# Patient Record
Sex: Male | Born: 1935
Health system: Southern US, Community
[De-identification: ages and names within clinical notes are randomized; demographics above are authoritative.]

## PROBLEM LIST (undated history)

## (undated) DIAGNOSIS — I255 Ischemic cardiomyopathy: Secondary | ICD-10-CM

## (undated) DIAGNOSIS — Z923 Personal history of irradiation: Secondary | ICD-10-CM

## (undated) DIAGNOSIS — Z87442 Personal history of urinary calculi: Secondary | ICD-10-CM

## (undated) DIAGNOSIS — I2510 Atherosclerotic heart disease of native coronary artery without angina pectoris: Secondary | ICD-10-CM

## (undated) DIAGNOSIS — I499 Cardiac arrhythmia, unspecified: Secondary | ICD-10-CM

## (undated) DIAGNOSIS — N138 Other obstructive and reflux uropathy: Secondary | ICD-10-CM

## (undated) DIAGNOSIS — G629 Polyneuropathy, unspecified: Secondary | ICD-10-CM

## (undated) DIAGNOSIS — N400 Enlarged prostate without lower urinary tract symptoms: Secondary | ICD-10-CM

## (undated) DIAGNOSIS — N183 Chronic kidney disease, stage 3 unspecified: Secondary | ICD-10-CM

## (undated) DIAGNOSIS — E119 Type 2 diabetes mellitus without complications: Secondary | ICD-10-CM

## (undated) DIAGNOSIS — Z972 Presence of dental prosthetic device (complete) (partial): Secondary | ICD-10-CM

## (undated) DIAGNOSIS — N200 Calculus of kidney: Secondary | ICD-10-CM

## (undated) DIAGNOSIS — H269 Unspecified cataract: Secondary | ICD-10-CM

## (undated) DIAGNOSIS — Z794 Long term (current) use of insulin: Secondary | ICD-10-CM

## (undated) DIAGNOSIS — I480 Paroxysmal atrial fibrillation: Secondary | ICD-10-CM

## (undated) DIAGNOSIS — N3281 Overactive bladder: Secondary | ICD-10-CM

## (undated) DIAGNOSIS — R351 Nocturia: Secondary | ICD-10-CM

## (undated) DIAGNOSIS — H409 Unspecified glaucoma: Secondary | ICD-10-CM

## (undated) DIAGNOSIS — E785 Hyperlipidemia, unspecified: Secondary | ICD-10-CM

## (undated) DIAGNOSIS — Z9221 Personal history of antineoplastic chemotherapy: Secondary | ICD-10-CM

## (undated) DIAGNOSIS — N401 Enlarged prostate with lower urinary tract symptoms: Secondary | ICD-10-CM

## (undated) DIAGNOSIS — K08109 Complete loss of teeth, unspecified cause, unspecified class: Secondary | ICD-10-CM

## (undated) HISTORY — DX: Unspecified cataract: H26.9

## (undated) HISTORY — DX: Calculus of kidney: N20.0

## (undated) HISTORY — PX: HERNIA REPAIR: SHX51

## (undated) HISTORY — PX: OTHER SURGICAL HISTORY: SHX169

## (undated) HISTORY — PX: POLYPECTOMY: SHX149

## (undated) HISTORY — PX: TONSILLECTOMY: SHX5217

## (undated) HISTORY — PX: COLONOSCOPY: SHX174

## (undated) HISTORY — DX: Polyneuropathy, unspecified: G62.9

## (undated) HISTORY — PX: CYSTOSCOPY: SUR368

## (undated) HISTORY — PX: LITHOTRIPSY: SUR834

## (undated) HISTORY — PX: UPPER GASTROINTESTINAL ENDOSCOPY: SHX188

## (undated) HISTORY — DX: Hyperlipidemia, unspecified: E78.5

## (undated) HISTORY — PX: EXTRACORPOREAL SHOCK WAVE LITHOTRIPSY: SHX1557

## (undated) HISTORY — PX: CATARACT EXTRACTION W/ INTRAOCULAR LENS IMPLANT: SHX1309

## (undated) HISTORY — DX: Atherosclerotic heart disease of native coronary artery without angina pectoris: I25.10

## (undated) HISTORY — PX: CIRCUMCISION: SUR203

---

## 1964-07-07 HISTORY — PX: INGUINAL HERNIA REPAIR: SUR1180

## 1973-07-07 HISTORY — PX: TYMPANOPLASTY: SHX33

## 1978-07-07 HISTORY — PX: CIRCUMCISION: SUR203

## 2005-01-22 ENCOUNTER — Encounter: Admission: RE | Admit: 2005-01-22 | Discharge: 2005-01-22 | Payer: Self-pay | Admitting: Endocrinology

## 2009-07-07 HISTORY — PX: UPPER GASTROINTESTINAL ENDOSCOPY: SHX188

## 2009-07-07 HISTORY — PX: PATENT FORAMEN OVALE CLOSURE: SHX2181

## 2009-07-09 ENCOUNTER — Encounter (INDEPENDENT_AMBULATORY_CARE_PROVIDER_SITE_OTHER): Payer: Self-pay | Admitting: *Deleted

## 2009-07-12 ENCOUNTER — Ambulatory Visit: Payer: Self-pay | Admitting: Internal Medicine

## 2009-07-12 DIAGNOSIS — R634 Abnormal weight loss: Secondary | ICD-10-CM | POA: Insufficient documentation

## 2009-07-12 DIAGNOSIS — E119 Type 2 diabetes mellitus without complications: Secondary | ICD-10-CM

## 2009-07-12 DIAGNOSIS — R110 Nausea: Secondary | ICD-10-CM | POA: Insufficient documentation

## 2009-07-12 DIAGNOSIS — R1084 Generalized abdominal pain: Secondary | ICD-10-CM

## 2009-07-13 ENCOUNTER — Ambulatory Visit: Payer: Self-pay | Admitting: Internal Medicine

## 2009-07-16 ENCOUNTER — Ambulatory Visit: Payer: Self-pay | Admitting: Internal Medicine

## 2009-07-16 LAB — CONVERTED CEMR LAB
BUN: 12 mg/dL (ref 6–23)
Creatinine, Ser: 1 mg/dL (ref 0.4–1.5)

## 2009-07-17 ENCOUNTER — Encounter: Payer: Self-pay | Admitting: Internal Medicine

## 2009-07-20 ENCOUNTER — Telehealth: Payer: Self-pay | Admitting: Internal Medicine

## 2009-07-20 ENCOUNTER — Ambulatory Visit: Payer: Self-pay | Admitting: Internal Medicine

## 2009-07-23 ENCOUNTER — Encounter: Payer: Self-pay | Admitting: Internal Medicine

## 2009-09-03 ENCOUNTER — Ambulatory Visit: Payer: Self-pay | Admitting: Internal Medicine

## 2009-09-03 DIAGNOSIS — Z860100 Personal history of colon polyps, unspecified: Secondary | ICD-10-CM | POA: Insufficient documentation

## 2009-09-03 DIAGNOSIS — K219 Gastro-esophageal reflux disease without esophagitis: Secondary | ICD-10-CM

## 2009-09-03 DIAGNOSIS — K5730 Diverticulosis of large intestine without perforation or abscess without bleeding: Secondary | ICD-10-CM | POA: Insufficient documentation

## 2009-10-18 ENCOUNTER — Encounter: Payer: Self-pay | Admitting: Internal Medicine

## 2009-11-13 ENCOUNTER — Encounter: Payer: Self-pay | Admitting: Internal Medicine

## 2009-11-29 ENCOUNTER — Ambulatory Visit: Payer: Self-pay | Admitting: Internal Medicine

## 2009-11-29 DIAGNOSIS — R0602 Shortness of breath: Secondary | ICD-10-CM

## 2009-11-29 DIAGNOSIS — E785 Hyperlipidemia, unspecified: Secondary | ICD-10-CM | POA: Insufficient documentation

## 2009-11-29 DIAGNOSIS — R930 Abnormal findings on diagnostic imaging of skull and head, not elsewhere classified: Secondary | ICD-10-CM | POA: Insufficient documentation

## 2010-02-01 ENCOUNTER — Ambulatory Visit: Payer: Self-pay

## 2010-02-01 ENCOUNTER — Ambulatory Visit (HOSPITAL_COMMUNITY): Admission: RE | Admit: 2010-02-01 | Discharge: 2010-02-01 | Payer: Self-pay | Admitting: Internal Medicine

## 2010-02-01 ENCOUNTER — Ambulatory Visit: Payer: Self-pay | Admitting: Cardiovascular Disease

## 2010-02-01 ENCOUNTER — Encounter: Payer: Self-pay | Admitting: Internal Medicine

## 2010-02-05 ENCOUNTER — Ambulatory Visit: Payer: Self-pay | Admitting: Internal Medicine

## 2010-02-05 ENCOUNTER — Ambulatory Visit: Payer: Self-pay | Admitting: Cardiology

## 2010-02-05 ENCOUNTER — Inpatient Hospital Stay (HOSPITAL_COMMUNITY): Admission: AD | Admit: 2010-02-05 | Discharge: 2010-02-07 | Payer: Self-pay | Admitting: Internal Medicine

## 2010-02-05 ENCOUNTER — Ambulatory Visit: Payer: Self-pay | Admitting: Surgery

## 2010-02-05 DIAGNOSIS — I200 Unstable angina: Secondary | ICD-10-CM

## 2010-02-05 HISTORY — PX: CARDIAC CATHETERIZATION: SHX172

## 2010-02-06 ENCOUNTER — Ambulatory Visit: Payer: Self-pay | Admitting: Vascular Surgery

## 2010-02-06 ENCOUNTER — Encounter: Payer: Self-pay | Admitting: Surgery

## 2010-02-11 ENCOUNTER — Inpatient Hospital Stay (HOSPITAL_COMMUNITY): Admission: RE | Admit: 2010-02-11 | Discharge: 2010-02-16 | Payer: Self-pay | Admitting: Cardiothoracic Surgery

## 2010-02-11 ENCOUNTER — Ambulatory Visit: Payer: Self-pay | Admitting: Internal Medicine

## 2010-02-11 DIAGNOSIS — Z8774 Personal history of (corrected) congenital malformations of heart and circulatory system: Secondary | ICD-10-CM

## 2010-02-11 DIAGNOSIS — Z951 Presence of aortocoronary bypass graft: Secondary | ICD-10-CM

## 2010-02-11 HISTORY — PX: CORONARY ARTERY BYPASS GRAFT: SHX141

## 2010-02-11 HISTORY — DX: Personal history of (corrected) congenital malformations of heart and circulatory system: Z87.74

## 2010-02-11 HISTORY — DX: Presence of aortocoronary bypass graft: Z95.1

## 2010-02-21 ENCOUNTER — Encounter: Payer: Self-pay | Admitting: Internal Medicine

## 2010-02-22 ENCOUNTER — Encounter: Payer: Self-pay | Admitting: Physician Assistant

## 2010-02-26 ENCOUNTER — Ambulatory Visit: Payer: Self-pay | Admitting: Cardiovascular Disease

## 2010-02-26 ENCOUNTER — Encounter: Payer: Self-pay | Admitting: Physician Assistant

## 2010-02-26 DIAGNOSIS — I2589 Other forms of chronic ischemic heart disease: Secondary | ICD-10-CM

## 2010-02-26 DIAGNOSIS — I4891 Unspecified atrial fibrillation: Secondary | ICD-10-CM

## 2010-02-26 DIAGNOSIS — H539 Unspecified visual disturbance: Secondary | ICD-10-CM

## 2010-02-26 DIAGNOSIS — I2541 Coronary artery aneurysm: Secondary | ICD-10-CM | POA: Insufficient documentation

## 2010-02-26 DIAGNOSIS — I25810 Atherosclerosis of coronary artery bypass graft(s) without angina pectoris: Secondary | ICD-10-CM | POA: Insufficient documentation

## 2010-03-14 ENCOUNTER — Encounter (HOSPITAL_COMMUNITY): Admission: RE | Admit: 2010-03-14 | Discharge: 2010-04-05 | Payer: Self-pay | Admitting: Internal Medicine

## 2010-03-18 ENCOUNTER — Encounter: Admission: RE | Admit: 2010-03-18 | Discharge: 2010-03-18 | Payer: Self-pay | Admitting: Cardiothoracic Surgery

## 2010-03-18 ENCOUNTER — Ambulatory Visit: Payer: Self-pay | Admitting: Cardiothoracic Surgery

## 2010-03-18 ENCOUNTER — Encounter: Payer: Self-pay | Admitting: Internal Medicine

## 2010-03-20 ENCOUNTER — Telehealth: Payer: Self-pay | Admitting: Internal Medicine

## 2010-03-27 ENCOUNTER — Encounter: Payer: Self-pay | Admitting: Internal Medicine

## 2010-04-05 ENCOUNTER — Telehealth: Payer: Self-pay | Admitting: Internal Medicine

## 2010-04-05 ENCOUNTER — Encounter: Payer: Self-pay | Admitting: Internal Medicine

## 2010-04-06 ENCOUNTER — Encounter (HOSPITAL_COMMUNITY)
Admission: RE | Admit: 2010-04-06 | Discharge: 2010-06-24 | Payer: Self-pay | Source: Home / Self Care | Attending: Internal Medicine | Admitting: Internal Medicine

## 2010-04-08 ENCOUNTER — Telehealth (INDEPENDENT_AMBULATORY_CARE_PROVIDER_SITE_OTHER): Payer: Self-pay | Admitting: *Deleted

## 2010-04-09 ENCOUNTER — Encounter: Payer: Self-pay | Admitting: Internal Medicine

## 2010-04-11 ENCOUNTER — Telehealth: Payer: Self-pay | Admitting: Internal Medicine

## 2010-04-18 ENCOUNTER — Encounter: Payer: Self-pay | Admitting: Internal Medicine

## 2010-04-23 ENCOUNTER — Encounter: Payer: Self-pay | Admitting: Internal Medicine

## 2010-05-01 ENCOUNTER — Encounter: Payer: Self-pay | Admitting: Internal Medicine

## 2010-05-01 ENCOUNTER — Ambulatory Visit: Payer: Self-pay | Admitting: Internal Medicine

## 2010-05-10 ENCOUNTER — Encounter: Payer: Self-pay | Admitting: Internal Medicine

## 2010-05-13 ENCOUNTER — Ambulatory Visit: Payer: Self-pay

## 2010-05-13 ENCOUNTER — Ambulatory Visit (HOSPITAL_COMMUNITY): Admission: RE | Admit: 2010-05-13 | Discharge: 2010-05-13 | Payer: Self-pay | Admitting: Internal Medicine

## 2010-05-13 ENCOUNTER — Encounter: Payer: Self-pay | Admitting: Internal Medicine

## 2010-07-02 ENCOUNTER — Encounter: Payer: Self-pay | Admitting: Internal Medicine

## 2010-07-12 ENCOUNTER — Encounter: Payer: Self-pay | Admitting: Internal Medicine

## 2010-08-06 NOTE — Procedures (Signed)
Summary: Colonoscopy  Patient: [name] Note: All result statuses are Final unless otherwise noted.  Tests: (1) Colonoscopy (COL)   COL Colonoscopy           DONE     [facility]     [address]     [place], [place]  [address]           COLONOSCOPY PROCEDURE REPORT           PATIENT:  Brendan Meyer, Brendan Meyer  MR#:  [id number]     BIRTHDATE:  1935-04-08, 74 yrs. old  GENDER:  male           ENDOSCOPIST:  Wilhemina Bonito. Eda Keys, MD     Referred by:  Adrian Prince, M.D.           PROCEDURE DATE:  07/13/2009     PROCEDURE:  Colonoscopy with snare polypectomy     ASA CLASS:  Class II     INDICATIONS:  Routine Risk Screening, abdominal pain           MEDICATIONS:   Fentanyl 75 mcg IV, Versed 8 mg IV           DESCRIPTION OF PROCEDURE:   After the risks benefits and     alternatives of the procedure were thoroughly explained, informed     consent was obtained.  Digital rectal exam was performed and     revealed no abnormalities.   The LB [id number] endoscope     was introduced through the anus and advanced to the cecum, which     was identified by both the appendix and ileocecal valve, without     limitations. Time to cecum = 4:06 min. The quality of the prep was     excellent, using MoviPrep.  The instrument was then slowly     withdrawn (time = 13:27 min) as the colon was fully examined.     <<PROCEDUREIMAGES>>           FINDINGS:  Two 3mm polyps were found in the descending colon and     rectum. Polyps were snared without cautery. Retrieval was     successful.   Moderate diverticulosis was found throughout the     colon.   Retroflexed views in the rectum revealed internal     hemorrhoids.    The scope was then withdrawn from the patient and     the procedure completed.           COMPLICATIONS:  None           ENDOSCOPIC IMPRESSION:     1) Two polyps- removed     2) Moderate diverticulosis throughout the colon     3) Internal hemorrhoids        RECOMMENDATIONS:     1) Follow up colonoscopy in 5 years     2) EGD today           ______________________________     Wilhemina Bonito. Eda Keys, MD           CC:  Adrian Prince, MD; The Patient           n.     eSIGNED:   Wilhemina Bonito. Eda Keys at 07/13/2009 08:58 AM           Vanessa Ralphs, [id number]  Note: An exclamation mark (!) indicates a result that was not dispersed into the flowsheet. Document Creation Date: 07/13/2009 8:57 AM _______________________________________________________________________  (1) Order result status: Final  Collection or observation date-time: 07/13/2009 08:39 Requested date-time:  Receipt date-time:  Reported date-time:  Referring Physician:   Ordering Physician: Fransico Setters [phone]) Specimen Source:  Source: Launa Grill Order Number: [id number] Lab site:   Appended Document: Colonoscopy Patient 's wife notified of apppt for CT scan at [facility].  He will come on Monday for lab.  Written instructions left at the front desk.   Darcey Nora RN, [facility]  July 13, 2009 1:47 PM     Clinical Lists Changes  Orders: Added new Referral order of CT Abdomen/Pelvis with Contrast (CT Abd/Pelvis w/con) - Signed      Appended Document: Colonoscopy recall     Procedures Next Due Date:    Colonoscopy: 07/2014

## 2010-08-06 NOTE — Letter (Signed)
Summary: [facility] Instructions  [facility]  [address] [place], [place] [address]   Phone: [phone]  Fax: [phone]       Brendan Meyer    1935-05-28    MRN: [id number]        Procedure Day /Date:FRIDAY, 07/13/09     Arrival Time:7:30 AM     Procedure Time:8:00 AM     Location of Procedure:                    [facility] (4th Floor)                        PREPARATION FOR COLONOSCOPY WITH MOVIPREP/ENDO   Starting 5 days prior to your procedure NOW do not eat nuts, seeds, popcorn, corn, beans, peas,  salads, or any raw vegetables.  Do not take any fiber supplements (e.g. Metamucil, Citrucel, and Benefiber).  THE DAY BEFORE YOUR PROCEDURE         DATE: 07/12/09  DAY: THURSDAY  1.  Drink clear liquids the entire day-NO SOLID FOOD  2.  Do not drink anything colored red or purple.  Avoid juices with pulp.  No orange juice.  3.  Drink at least 64 oz. (8 glasses) of fluid/clear liquids during the day to prevent dehydration and help the prep work efficiently.  CLEAR LIQUIDS INCLUDE: Water Jello Ice Popsicles Tea (sugar ok, no milk/cream) Powdered fruit flavored drinks Coffee (sugar ok, no milk/cream) Gatorade Juice: apple, white grape, white cranberry  Lemonade Clear bullion, consomm, broth Carbonated beverages (any kind) Strained chicken noodle soup Hard Candy                             4.  In the morning, mix first dose of MoviPrep solution:    Empty 1 Pouch A and 1 Pouch B into the disposable container    Add lukewarm drinking water to the top line of the container. Mix to dissolve    Refrigerate (mixed solution should be used within 24 hrs)  5.  Begin drinking the prep at 5:00 p.m. The MoviPrep container is divided by 4 marks.   Every 15 minutes drink the solution down to the next mark (approximately 8 oz) until the full liter is complete.   6.  Follow completed prep with 16 oz of clear liquid of your choice (Nothing red  or purple).  Continue to drink clear liquids until bedtime.  7.  Before going to bed, mix second dose of MoviPrep solution:    Empty 1 Pouch A and 1 Pouch B into the disposable container    Add lukewarm drinking water to the top line of the container. Mix to dissolve    Refrigerate  THE DAY OF YOUR PROCEDURE      DATE: 07/13/09 DAY: FRIDAY  Beginning at 4:00 AM (5 hours before procedure):         1. Every 15 minutes, drink the solution down to the next mark (approx 8 oz) until the full liter is complete.  2. Follow completed prep with 16 oz. of clear liquid of your choice.    3. You may drink clear liquids until 6:00 AM(2 HOURS BEFORE PROCEDURE).   MEDICATION INSTRUCTIONS  Unless otherwise instructed, you should take regular prescription medications with a small sip of water   as early as possible the morning of your procedure.  Diabetic patients -  see separate instructions.         OTHER INSTRUCTIONS  You will need a responsible adult at least 74 years of age to accompany you and drive you home.   This person must remain in the waiting room during your procedure.  Wear loose fitting clothing that is easily removed.  Leave jewelry and other valuables at home.  However, you may wish to bring a book to read or  an iPod/MP3 player to listen to music as you wait for your procedure to start.  Remove all body piercing jewelry and leave at home.  Total time from sign-in until discharge is approximately 2-3 hours.  You should go home directly after your procedure and rest.  You can resume normal activities the  day after your procedure.  The day of your procedure you should not:   Drive   Make legal decisions   Operate machinery   Drink alcohol   Return to work  You will receive specific instructions about eating, activities and medications before you leave.    The above instructions have been reviewed and explained to me by   _______________________    I  fully understand and can verbalize these instructions _____________________________ Date _________

## 2010-08-06 NOTE — Letter (Signed)
Summary: Patient Notice- Polyp Results  [facility]  [address] [place], [place] [address]   Phone: [phone]  Fax: [phone]        July 17, 2009 MRN: [id number]    ALEXSIS BRANSCOM [address] [place], [place]  [address]    Dear Mr. Massoud,  I am pleased to inform you that the colon polyp(s) removed during your recent colonoscopy was (were) found to be benign (no cancer detected) upon pathologic examination.  I recommend you have a repeat colonoscopy examination in 5 years to look for recurrent polyps, as having colon polyps increases your risk for having recurrent polyps or even colon cancer in the future.  Should you develop new or worsening symptoms of abdominal pain, bowel habit changes or bleeding from the rectum or bowels, please schedule an evaluation with either your primary care physician or with me.  Additional information/recommendations:  __ No further action with gastroenterology is needed at this time. Please      follow-up with your primary care physician for your other healthcare      needs.  Please call us if you are having persistent problems or have questions about your condition that have not been fully answered at this time.  Sincerely,  Hilarie Fredrickson MD  This letter has been electronically signed by your physician.  Appended Document: Patient Notice- Polyp Results Letter mailed 01.13.11

## 2010-08-06 NOTE — Progress Notes (Signed)
  Walk in Patient Form Recieved " Pt needs Meds Filled"  sent to [facility]  April 08, 2010 8:57 AM

## 2010-08-06 NOTE — Letter (Signed)
Summary:  Walk-In Patient Form   Walk-In Patient Form   Imported By: Marylou Mccoy 04/30/2010 15:11:11  _____________________________________________________________________  External Attachment:    Type:   Image     Comment:   External Document

## 2010-08-06 NOTE — Assessment & Plan Note (Signed)
Summary: NP6/ CORONARY CALIFICATION / PT HAS [facility] CHOICE /G D   Visit Type:  Initial Consult Referring Provider:  Adrian Prince, MD Primary Provider:  Adrian Prince, MD  CC:  cad.  History of Present Illness: Brendan Meyer is a 76 y/o man with a h/o diabetes (long-standing), hyperlipidemia and strong family history of CAD. Referred by Dr. Evlyn Kanner for abnormal CT which showed coronary calcifications.  Denies any h/o known CAD. Has never had a stress test or cardiac cath.   Stays fairly active. [facility] without any CP or SOB. 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 Feels fatigued. Used to be able to mow the entire lawn but now has to take breaks due to mild dyspnea.  Recently had extensive work-up for nausea and weight loss with Dr. [name]. As part of work-up had CT which showed multivessel coronary calcifications and referred here for evaluation.   Mother had a CVA and died with MI on way to hospital in ambulance in her 37s. Brother died recently with CHF.   On simva 20. Most recent lipids TC 181 TG 105 HDL 37 LDL 123  Current Medications (verified): 1)  Zocor 20 Mg Tabs (Simvastatin) .... Take One By Mouth Once Daily 2)  Ambien 10 Mg Tabs (Zolpidem Tartrate) .... Take One By Mouth At Bedtime As Needed 3)  Hydrocodone-Acetaminophen 5-500 Mg Tabs (Hydrocodone-Acetaminophen) .... Take One By Mouth As Needed 4)  Promethazine Hcl 12.5 Mg  Tabs (Promethazine Hcl) .... As Needed 5)  Metformin Hcl 500 Mg Tabs (Metformin Hcl) .Marland Kitchen.. 1 By Mouth Two Times A Day 6)  Study Drug .... As Directed  Allergies (verified): No Known Drug Allergies  Past History:  Past Medical History: Last updated: 11/16/2009 1. Neuropathy 2. Hyperlipidemia 3. Depression 4. Bells Palsy 5. BPH 6. Kidney Stones 7. Diabetes  Past Surgical History: Last updated: 07/12/2009 Eardrum Surgery Tonsillectomy ESWL Hernia Surgery double  Family  History: Last updated: 2009/12/12 CVA/MI-Mother died  No FH of Colon Cancer: patient did not know his bilogical father Family History of Breast Cancer: maternal aunt  Social History: Last updated: 07/12/2009 Occupation: retired married with one son and two daughters Patient has never smoked.  Alcohol Use - no Daily Caffeine Use 2 per day Illicit Drug Use - no  Risk Factors: Smoking Status: never (07/12/2009)  Family History: Reviewed history from 07/12/2009 and no changes required. CVA/MI-Mother died  No FH of Colon Cancer: patient did not know his bilogical father Family History of Breast Cancer: maternal aunt  Social History: Reviewed history from 07/12/2009 and no changes required. Occupation: retired married with one son and two daughters Patient has never smoked.  Alcohol Use - no Daily Caffeine Use 2 per day Illicit Drug Use - no  Review of Systems       As per HPI and past medical history; otherwise all systems negative.   Vital Signs:  Patient profile:   76 year old male Height:      71 inches Weight:      156 pounds BMI:     21.84 Pulse rate:   69 / minute Resp:     16 per minute BP sitting:   118 / 64  (right arm)  Vitals Entered By: Brendan Coy, CNA (12-12-2009 9:16 AM)  Physical Exam  General:  Gen: well appearing. no resp difficulty HEENT: normal Neck: supple. no JVD. Carotids 2+ bilat; no bruits.  No lymphadenopathy or thryomegaly appreciated. Cor: PMI nondisplaced. Regular rate & rhythm. No rubs, gallops, murmur. Lungs: clear Abdomen: soft, nontender, nondistended. No hepatosplenomegaly. No bruits or masses. Good bowel sounds. Extremities: no cyanosis, clubbing, rash, edema Neuro: alert & orientedx3, cranial nerves grossly intact. moves all 4 extremities w/o difficulty. affect pleasant    Impression & Recommendations:  Problem # 1:  CT, CHEST, ABNORMAL (ICD-793.1) We had long talk about the difference between atherosclerosis in the  walls of arteries and obstructive plaque. He has only mild exertional symptoms but given long-standing DM2 may be relatively asx even in the face of signifcant CAD. ECG mildly abnormal. Discussed cath vs stress test. He prefers to start with stress test. We will approach him for Promise study (stress test vs cardiac CT).   Orders: EKG w/ Interpretation (93[address]) Echocardiogram (Echo)  Problem # 2:  HYPERLIPIDEMIA-MIXED (ICD-272.4) Given DM2 and atherosclerosis will need very aggressive lipid management with goald LDL < 70. Will increase simva to 40. Will likely need to switch to atorva or rousva down the road.  Patient Instructions: 1)  Your physician recommends that you schedule a follow-up appointment in: 2 months with Dr Gala Romney 2)  Your physician has recommended you make the following change in your medication: Increase simvastatin to 40 mg a day 3)  Your physician has requested that you have an echocardiogram.  Echocardiography is a painless test that uses sound waves to create images of your heart. It provides your doctor with information about the size and shape of your heart and how well your heart's chambers and valves are working.  This procedure takes approximately one hour. There are no restrictions for this procedure. Prescriptions: SIMVASTATIN 40 MG TABS (SIMVASTATIN) one daily  #90 x 3   Entered by:   Charolotte Capuchin, RN   Authorized by:   Dolores Patty, MD, [facility]   Signed by:   Charolotte Capuchin, RN on 11/29/2009   Method used:   Electronically to        [facility]* (retail)       [address] [place], [place]  [address]       Ph: [phone]       Fax: [phone]   RxID:   [id number]

## 2010-08-06 NOTE — Assessment & Plan Note (Signed)
Summary: f/u wt. loss/abd. pain/nausea   History of Present Illness Visit Type: Follow-up Visit Primary GI MD: Yancey Flemings MD Primary Provider: Adrian Prince, MD Requesting Provider: na Chief Complaint: Patient here to follow up from CT and procedures. Patient states that he is doing ok as long as he keeps a little bit on his stomach at all times,  otherwise he has alot of nausea.  History of Present Illness:   76 year old with diabetes mellitus, hyperlipidemia, BPH, kidney stones, depression, and neuropathy. He was evaluated 01/10/2024for nausea, anorexia, uneasy feeling in the abdomen, and weight loss. See that dictation. He subsequently underwent colonoscopy and upper endoscopy on January 7. Colonoscopy revealed diminutive adenomas, diverticulosis, and hemorrhoids. Followup in 5 years recommended. Upper endoscopy revealed esophagitis. He was recommended to take Prilosec OTC. He has not. He was given Phenergan for nausea which she rarely uses. A CT scan of the abdomen and pelvis was obtained and returned negative for significant abnormalities. Incidental tiny nodule of the lung noted. He is a nonsmoker. No followup needed. He tells me that as long as he eats something, his nausea is better. He misunderstood that he should take Prilosec despite it being written on his report. He really has no new complaints. He has been able to gain weight since his last visit. He has gained 5-6 pounds. He is having less in the way of worries since his exams return without significant abnormalities. He is accompanied today by his wife.   GI Review of Systems    Reports nausea.      Denies abdominal pain, acid reflux, belching, bloating, chest pain, dysphagia with liquids, dysphagia with solids, heartburn, loss of appetite, vomiting, vomiting blood, weight loss, and  weight gain.        Denies anal fissure, black tarry stools, change in bowel habit, constipation, diarrhea, diverticulosis, fecal incontinence, heme  positive stool, hemorrhoids, irritable bowel syndrome, jaundice, light color stool, liver problems, rectal bleeding, and  rectal pain.    Current Medications (verified): 1)  Amaryl 2 Mg Tabs (Glimepiride) .Marland Kitchen.. 1 By Mouth Two Times A Day 2)  Janumet 50-500 Mg Tabs (Sitagliptin-Metformin Hcl) .... Take One By Mouth Once Daily 3)  Zocor 20 Mg Tabs (Simvastatin) .... Take One By Mouth Once Daily 4)  Ambien 10 Mg Tabs (Zolpidem Tartrate) .... Take One By Mouth At Bedtime As Needed 5)  Hydrocodone-Acetaminophen 5-500 Mg Tabs (Hydrocodone-Acetaminophen) .... Take One By Mouth As Needed 6)  Promethazine Hcl 12.5 Mg  Tabs (Promethazine Hcl) .... As Needed 7)  Metformin Hcl 500 Mg Tabs (Metformin Hcl) .... Take One By Mouth Once Daily  Allergies (verified): No Known Drug Allergies  Past History:  Past Medical History: Last updated: 07/11/2009 Neuropathy Hyperlipidemia Depression Bells Palsy BPH Kidney Stones Diabetes  Past Surgical History: Last updated: 16-Jul-2009 Eardrum Surgery Tonsillectomy ESWL Hernia Surgery double  Family History: Last updated: 16-Jul-2009 CVA/MI-Mother died No FH of Colon Cancer: patient did not know his bilogical father Family History of Breast Cancer: maternal aunt  Social History: Last updated: 07-16-09 Occupation: retired married with one son and two daughters Patient has never smoked.  Alcohol Use - no Daily Caffeine Use 2 per day Illicit Drug Use - no  Review of Systems       The patient complains of arthritis/joint pain, fatigue, and urination changes/pain.  The patient denies allergy/sinus, anemia, anxiety-new, back pain, blood in urine, breast changes/lumps, change in vision, confusion, cough, coughing up blood, depression-new, fainting, fever, headaches-new, hearing problems,  heart murmur, heart rhythm changes, itching, menstrual pain, muscle pains/cramps, night sweats, nosebleeds, pregnancy symptoms, shortness of breath, skin rash,  sleeping problems, sore throat, swelling of feet/legs, swollen lymph glands, thirst - excessive , urination - excessive , urine leakage, vision changes, and voice change.    Vital Signs:  Patient profile:   76 year old male Height:      71 inches Weight:      156.6 pounds BMI:     21.92 Pulse rate:   80 / minute Pulse rhythm:   regular BP sitting:   104 / 62  (left arm) Cuff size:   regular  Vitals Entered By: Harlow Mares CMA Duncan Dull) (September 03, 2009 1:55 PM)  Physical Exam  General:  Well developed, well nourished, no acute distress. Head:  Normocephalic and atraumatic. Eyes:  PERRLA, no icterus. Mouth:  No deformity or lesionsal. Lungs:  Clear throughout to auscultation. Heart:  Regular rate and rhythm; no murmurs, rubs,  or bruits. Abdomen:  Soft, nontender and nondistended. No masses, hepatosplenomegaly or hernias noted. Normal bowel sounds. Pulses:  Normal pulses noted. Extremities:  no edema Neurologic:  alert and oriented Skin:  jaundice Psych:  Alert and cooperative. Normal mood and affect.   Impression & Recommendations:  Problem # 1:  GERD (ICD-530.81) Esophagitis on endoscopy. Suspect that GERD is responsible for his intermittent nausea improved with meals.  Plan: #1. Omeprazole 20 mg daily prescribed. Prescription electronically submitted to pharmacy #2. Reflux precautions #3. GI followup p.r.n.  Problem # 2:  PERSONAL HX COLONIC POLYPS (ICD-V12.72) adenomatous colon polyps on recent exam.  Plan: #1. Colonoscopy in 5 years  Problem # 3:  DIVERTICULOSIS-COLON (ICD-562.10) incidental.  Plan: #1. High-fiber diet  Patient Instructions: 1)  Omeprazole 20 mg 1 by mouth once daily #30 x 11 RFs. Rx. sent to [facility]. 2)  Please schedule a follow-up appointment as needed.  3)  The medication list was reviewed and reconciled.  All changed / newly prescribed medications were explained.  A complete medication list was provided to the patient /  caregiver. 4)  copy: Dr. Adrian Prince Prescriptions: OMEPRAZOLE 20 MG TBEC (OMEPRAZOLE) 1 tablet by mouth once daily  #30 x 11   Entered by:   Milford Cage NCMA   Authorized by:   Hilarie Fredrickson MD   Signed by:   Milford Cage NCMA on 09/03/2009   Method used:   Electronically to        [facility]* (retail)       [address] [place], [place]  [address]       Ph: [phone]       Fax: [phone]   RxID:   [id number]

## 2010-08-06 NOTE — Miscellaneous (Signed)
Summary: [facility] Progress Note   [facility] Progress Note   Imported By: Roderic Ovens 04/16/2010 15:33:07  _____________________________________________________________________  External Attachment:    Type:   Image     Comment:   External Document

## 2010-08-06 NOTE — Medication Information (Signed)
Summary: [employer] Prior Authorization Request Form   [employer] Prior Authorization Request Form   Imported By: Roderic Ovens 05/09/2010 11:32:34  _____________________________________________________________________  External Attachment:    Type:   Image     Comment:   External Document

## 2010-08-06 NOTE — Letter (Signed)
Summary: Appt Reminder 2  [facility]  [address] [place], [place] [address]   Phone: [phone]  Fax: [phone]        July 23, 2009 MRN: [id number]    Brendan Meyer [address] [place], [place]  [address]    Dear [name],   You have a return appointment with Dr.Jaeleen Inzunza [name] on 09/03/2009 at 1:30 p.m.  Please remember to bring a complete list of the medicines you are taking, your insurance card and your co-pay.  If you have to cancel or reschedule this appointment, please call before 5:00 pm the evening before to avoid a cancellation fee.  If you have any questions or concerns, please call [phone].    Sincerely,    Teryl Lucy RN

## 2010-08-06 NOTE — Progress Notes (Signed)
Summary: Triage / Recent bypass surgery "thin and w/ anemia..."  Phone Note From Other Clinic   Caller: Terry @ [facility]  [phone] Call For: Dr. [name] Summary of Call: pt. just had bypass surgery and would like to discuss pt.'s care Initial call taken by: Karna Christmas,  April 05, 2010 2:39 PM  Follow-up for Phone Call        Last seen by Dr.Dorsey Charette 09-03-2009. Pt. had heart bypass surgery 02-11-10. Per Aurther Loft, pt. is still thin and anemic, he is in his 2nd week of cardiac rehab. program. Pt. c/o fullness yesterday, usually after meals, not assc. w/exercise. Belching/burping relieves pain.  Not similiar to his cardiac pains. Pt's PCP, Dr.South, wants pt. to see Dr.Alvena Kiernan. Pt. doesn't take his Omeprazole.  1) See Dr.Sweta Halseth 04-11-10 at 9:45am 2) Resume Omeprazole 20mg  QAM 3) Gas-x,Phazyme, etc. QID until OV. 4) Soft,bland diet. No spicy,greasy,fried foods.  5) If symptoms become worse call back immediately or go to ER.  Follow-up by: Laureen Ochs LPN,  April 05, 2010 3:10 PM  Additional Follow-up for Phone Call Additional follow up Details #1::        Has had extensive GI work up this year. Still early in cardiac post op period. He needs to be on PPI two times a day for known esophagitis. Unless there is something else that i am not aware of, no need for GI eval at this time Additional Follow-up by: Hilarie Fredrickson MD,  April 08, 2010 9:22 AM    Additional Follow-up for Phone Call Additional follow up Details #2::    Above MD orders reviewed with Aurther Loft at [facility]., she feels pt. needs to keep appt. w/Dr.Travius Crochet.  I have left a message for the patient to call me. Laureen Ochs LPN  April 08, 2010 9:29 AM   Per pt., he started back on Omeprazole once daily and has been taking Beano. He states a "big improvement" in symptoms over the weekend. He will increase Omeprazole to two times a day. His appt. w/Dr.Jurrell Royster is cancelled.  Pt. advised to call back as needed.     Follow-up by: Laureen Ochs LPN,  April 08, 2010 9:47 AM

## 2010-08-06 NOTE — Letter (Signed)
Summary: Diabetic Instructions   Gastroenterology  [address] [place], [place] [address]   Phone: [phone]  Fax: [phone]    Brendan Meyer [birth year] MRN: [id number]   X  ORAL DIABETIC MEDICATION INSTRUCTIONS  The day before your procedure:   Take your diabetic pill as you do normally  The day of your procedure:   Do not take your diabetic pill    We will check your blood sugar levels during the admission process and again in Recovery before discharging you home  ________________________________________________________________________

## 2010-08-06 NOTE — Letter (Signed)
Summary: [facility] Office Visit   [facility] Office Visit   Imported By: Roderic Ovens 05/23/2010 16:03:06  _____________________________________________________________________  External Attachment:    Type:   Image     Comment:   External Document

## 2010-08-06 NOTE — Assessment & Plan Note (Signed)
Summary: PER CHECK OUT/SF   Visit Type:  Follow-up Referring Provider:  Adrian Prince, MD Primary Provider:  Adrian Prince, MD  CC:  shortness of breath since 7/29 and CHF Management.  History of Present Illness: Elijah Birk is a 76 y/o man with a h/o diabetes (long-standing), hyperlipidemia and strong family history of CAD. Referred last month by Dr. Evlyn Kanner for abnormal abdominal CT which showed coronary calcifications.  Denies any h/o known CAD. Has never had a stress test or cardiac cath. Mother had a CVA and died with MI on way to hospital in ambulance in her 72s. Brother died recently with CHF.   When wee saw him we arranged for a stress test but this was not done yet. DEid have echo which showed ef 35-40% with apical and sept WMA.  Typically very active without symptoms, however over past few days has had severeal episodes of chest tightness and SOB. This was quite severe yesterday and still doesn't fell 100% right but denies acitve CP. No edema, orthopnea or PND.  Problems Prior to Update: 1)  Dyspnea  (ICD-786.05) 2)  Hyperlipidemia-mixed  (ICD-272.4) 3)  Ct, Chest, Abnormal  (ICD-793.1) 4)  Diverticulosis-colon  (ICD-562.10) 5)  Personal Hx Colonic Polyps  (ICD-V12.72) 6)  Gerd  (ICD-530.81) 7)  Weight Loss-abnormal  (ICD-783.21) 8)  Nausea  (ICD-787.02) 9)  Abdominal Pain -generalized  (ICD-789.07) 10)  Diabetes Mellitus  (ICD-250.00)  Medications Prior to Update: 1)  Simvastatin 40 Mg Tabs (Simvastatin) .... One Daily 2)  Ambien 10 Mg Tabs (Zolpidem Tartrate) .... Take One By Mouth At Bedtime As Needed 3)  Hydrocodone-Acetaminophen 5-500 Mg Tabs (Hydrocodone-Acetaminophen) .... Take One By Mouth As Needed 4)  Promethazine Hcl 12.5 Mg  Tabs (Promethazine Hcl) .... As Needed 5)  Metformin Hcl 500 Mg Tabs (Metformin Hcl) .Marland Kitchen.. 1 By Mouth Two Times A Day 6)  Study Drug .... As Directed  Current Medications (verified): 1)  Simvastatin 40 Mg Tabs (Simvastatin) .... 1/2 Tab Daily 2)   Ambien 10 Mg Tabs (Zolpidem Tartrate) .... Take One By Mouth At Bedtime As Needed 3)  Hydrocodone-Acetaminophen 5-500 Mg Tabs (Hydrocodone-Acetaminophen) .... Take One By Mouth As Needed 4)  Promethazine Hcl 12.5 Mg  Tabs (Promethazine Hcl) .... As Needed 5)  Metformin Hcl 500 Mg Tabs (Metformin Hcl) .Marland Kitchen.. 1 By Mouth Two Times A Day 6)  Study Drug .... As Directed  Allergies (verified): No Known Drug Allergies  Past History:  Past Surgical History: Last updated: 07/12/2009 Eardrum Surgery Tonsillectomy ESWL Hernia Surgery double  Family History: Last updated: 12/28/09 CVA/MI-Mother died  No FH of Colon Cancer: patient did not know his bilogical father Family History of Breast Cancer: maternal aunt  Social History: Last updated: 07/12/2009 Occupation: retired married with one son and two daughters Patient has never smoked.  Alcohol Use - no Daily Caffeine Use 2 per day Illicit Drug Use - no  Risk Factors: Smoking Status: never (07/12/2009)  Past Medical History: 1. Neuropathy 2. Hyperlipidemia 3. Depression 4. Bells Palsy 5. BPH 6. Kidney Stones 7. Diabetes 8. echo 01/2010    --ef 35-40% septal/apical wma  Family History: Reviewed history from 2009/12/28 and no changes required. CVA/MI-Mother died  No FH of Colon Cancer: patient did not know his bilogical father Family History of Breast Cancer: maternal aunt  Social History: Reviewed history from 07/12/2009 and no changes required. Occupation: retired married with one son and two daughters Patient has never smoked.  Alcohol Use - no Daily Caffeine Use 2 per day  Illicit Drug Use - no  Review of Systems       As per HPI and past medical history; otherwise all systems negative.    Vital Signs:  Patient profile:   76 year old male Height:      71 inches Weight:      158 pounds BMI:     22.12 O2 Sat:      98 % on Room air Pulse rate:   75 / minute BP sitting:   108 / 64  (right arm) Cuff size:    regular  Vitals Entered By: Hardin Negus, RMA (February 05, 2010 10:11 AM)  O2 Flow:  Room air CC: shortness of breath since 7/29, CHF Management Comments walked 2 laps O2 dropped to 96 HR to 115   Physical Exam  General:  Gen: well appearing. no resp difficulty HEENT: normal Neck: supple. no JVD. Carotids 2+ bilat; no bruits. No lymphadenopathy or thryomegaly appreciated. Cor: PMI nondisplaced. Regular rate & rhythm. No rubs, gallops, murmur. Lungs: clear Abdomen: soft, nontender, nondistended. No hepatosplenomegaly. No bruits or masses. Good bowel sounds. Extremities: no cyanosis, clubbing, rash, edema Neuro: alert & orientedx3, cranial nerves grossly intact. moves all 4 extremities w/o difficulty. affect pleasant    Impression & Recommendations:  Problem # 1:  UNSTABLE ANGINA (ICD-411.1) Symptoms very concerning for [place] especially in light of risk factors and decreased  EF. Have arranged for admission and R and L heart cath today with Dr. Riley Kill. Start ASA, heparin. As not having active symptoms now will have his wife drive him across the street to hospital. Will eventually need ACE-I.   CHF Assessment/Plan:      The patient's current weight is 158 pounds.  His previous weight was 156 pounds.     Appended Document: PER CHECK OUT/SF ECG with NSR 69. septal Qs. No ST-T wave abnormalities.

## 2010-08-06 NOTE — Letter (Signed)
Summary: [facility]  [facility]   Imported By: Marylou Mccoy 06/04/2010 08:56:24  _____________________________________________________________________  External Attachment:    Type:   Image     Comment:   External Document

## 2010-08-06 NOTE — Letter (Signed)
Summary: New Patient letter  [facility]  [address] [place], [place] [address]   Phone: [phone]  Fax: [phone]       07/09/2009 MRN: [id number]  Brendan Meyer [address] [place], [place]  [address]  Dear Mr. Caswell,  Welcome to the Gastroenterology Division at [facility].    You are scheduled to see Dr.  [name] on 08-01-2009 at 10am on the 3rd floor at [facility], [address]  We ask that you try to arrive at our office 15 minutes prior to your appointment time to allow for check-in.  We would like you to complete the enclosed self-administered evaluation form prior to your visit and bring it with you on the day of your appointment.  We will review it with you.  Also, please bring a complete list of all your medications or, if you prefer, bring the medication bottles and we will list them.  Please bring your insurance card so that we may make a copy of it.  If your insurance requires a referral to see a specialist, please bring your referral form from your primary care physician.  Co-payments are due at the time of your visit and may be paid by cash, check or credit card.     Your office visit will consist of a consult with your physician (includes a physical exam), any laboratory testing he/she may order, scheduling of any necessary diagnostic testing (e.g. x-ray, ultrasound, CT-scan), and scheduling of a procedure (e.g. Endoscopy, Colonoscopy) if required.  Please allow enough time on your schedule to allow for any/all of these possibilities.    If you cannot keep your appointment, please call [phone] to cancel or reschedule prior to your appointment date.  This allows [place] the opportunity to schedule an appointment for another patient in need of care.  If you do not cancel or reschedule by 5 p.m. the business day prior to your appointment date, you will be charged a $50.00 late cancellation/no-show fee.    Thank you for  choosing [facility] for your medical needs.  We appreciate the opportunity to care for you.  Please visit [place] at our website  to learn more about our practice.                     Sincerely,                                                             The Gastroenterology Division

## 2010-08-06 NOTE — Assessment & Plan Note (Signed)
Summary: eph/post CABG   Referring Provider:  Adrian Prince, MD Primary Provider:  Adrian Prince, MD  CC:  yesterday had a couple of sharp pains while walking.Brendan Meyer  History of Present Illness: this is a 76 year old white male patient who underwent CABG x6 with LIMA to the LAD, SVG sequential to the diagonal one and OM1, SVG to the fourth diagonal, SVG to the proximal PDA and distal PDA, and closure of patent foramen ovale February 14, 2010  The patient had postoperative fibrillation with rapid ventricular response treated with amiodarone and beta blocker.  The patient's doing extremely well postop. They are struggling to follow a 2 g sodium diet, but they are trying hard. The patient has had some sharp shooting chest pains but they were after doing more than he usually does and not resting. He's also had some back pain at night associated with more frequent urination but he has a known kidney stone. When he gets up at night he's had some visual disturbances described as seeing a yellow dot. He denies any dizziness or presyncope. This visual disturbance lasted about 5 minutes before it normalizes. It only happens at night when he gets up to use the restroom.  The patient states his Zocor was changed to Lipitor in the hospital and he like to go back on his Zocor because of insurance reasons.  Current Medications (verified): 1)  Lipitor 80 Mg Tabs (Atorvastatin Calcium) .... Take One Tablet By Mouth Daily. 2)  Metformin Hcl 500 Mg Tabs (Metformin Hcl) .Brendan Meyer.. 1 By Mouth Two Times A Day 3)  Study Drug .... As Directed 4)  Folic Acid 1 Mg Tabs (Folic Acid) .... Take 1 Tablet Each Morning 5)  Metoprolol Tartrate 25 Mg Tabs (Metoprolol Tartrate) .... Take One Tablet By Mouth Twice A Day 6)  Potassium Chloride Crys Cr 20 Meq Cr-Tabs (Potassium Chloride Crys Cr) .... Take One Tablet By Mouth Daily 7)  Pacerone 200 Mg Tabs (Amiodarone Hcl) .... Take 1 Tablet By Mouth Twice Daily 8)  Furosemide 40 Mg Tabs  (Furosemide) .... Take One Tablet By Mouth Daily. 9)  Aspirin Ec 325 Mg Tbec (Aspirin) .... Take One Tablet By Mouth Daily 10)  Ferrex 150 150 Mg Caps (Polysaccharide Iron Complex) .... Take 1 Capsule Daily  Allergies (verified): No Known Drug Allergies  Past History:  Past Medical History: Last updated: 02/05/2010 1. Neuropathy 2. Hyperlipidemia 3. Depression 4. Bells Palsy 5. BPH 6. Kidney Stones 7. Diabetes 8. echo 01/2010    --ef 35-40% septal/apical wma  Review of Systems       see history of present illness  Vital Signs:  Patient profile:   76 year old male Weight:      150 pounds Pulse rate:   80 / minute Pulse rhythm:   regular BP sitting:   126 / 66  (left arm) Cuff size:   regular  Physical Exam  General:   Well-nournished, in no acute distress. Neck: No JVD, HJR, Bruit, or thyroid enlargement Lungs: No tachypnea, clear without wheezing, rales, or rhonchi Cardiovascular: Incisions healing well,RRR, PMI not displaced, 2/6 systolic murmur at the left sternal border, no  gallops, bruit, thrill, or heave. Abdomen: BS normal. Soft without organomegaly, masses, lesions or tenderness. Extremities: without cyanosis, clubbing or edema. Good distal pulses bilateral SKin: Warm, no lesions or rashes  Musculoskeletal: No deformities Neuro: no focal signs    EKG  Procedure date:  02/26/2010  Findings:      sinus bradycardia 55 beats per  minute with septal infarct nonspecific ST-T wave changes no acute change  Impression & Recommendations:  Problem # 1:  CAD, ARTERY BYPASS GRAFT (ICD-414.04) Patient had CABG x6 and closure of a patent foramen ovale February 14, 2010. He is progressing nicely. I recommend he start cardiac rehabilitation. His updated medication list for this problem includes:    Metoprolol Tartrate 25 Mg Tabs (Metoprolol tartrate) .Brendan Meyer... Take one tablet by mouth twice a day    Aspirin Ec 325 Mg Tbec (Aspirin) .Brendan Meyer... Take one tablet by mouth  daily  Problem # 2:  ATRIAL FIBRILLATION (ICD-427.31) Patient is in sinus bradycardia at 55 feet beats per minute. We will decrease his amiodarone to 200 mg once daily. His updated medication list for this problem includes:    Metoprolol Tartrate 25 Mg Tabs (Metoprolol tartrate) .Brendan Meyer... Take one tablet by mouth twice a day    Pacerone 200 Mg Tabs (Amiodarone hcl) .Brendan Meyer... Take 1 tablet by mouth daily    Aspirin Ec 325 Mg Tbec (Aspirin) .Brendan Meyer... Take one tablet by mouth daily  Problem # 3:  CARDIOMYOPATHY, ISCHEMIC (ICD-414.8) Pt has mild LV dysfunction ejection fraction 45-50%. They're having trouble following a strict 2 g sodium diet. We have given them guidelines on this. He is euvolemic today. His updated medication list for this problem includes:    Metoprolol Tartrate 25 Mg Tabs (Metoprolol tartrate) .Brendan Meyer... Take one tablet by mouth twice a day    Pacerone 200 Mg Tabs (Amiodarone hcl) .Brendan Meyer... Take 1 tablet by mouth daily    Furosemide 40 Mg Tabs (Furosemide) .Brendan Meyer... Take one tablet by mouth daily.    Aspirin Ec 325 Mg Tbec (Aspirin) .Brendan Meyer... Take one tablet by mouth daily  Problem # 4:  UNSPECIFIED VISUAL DISTURBANCE (ICD-368.9) Patient is having an unusual visual disturbance when he gets up at night. I had asked him to dangle his legs on the side of the bed before he gets up to use the bathroom. If this does not help he needs to be re-evaluated. He has no associated dizziness or presyncope.  Problem # 5:  HYPERLIPIDEMIA-MIXED (ICD-272.4) Patient's insurance covers Zocor and he would like to resume this. We'll stop the Lipitor and resume Zocor 40 mg daily. He should have a lipid profile and liver function tests in 2 months. His updated medication list for this problem includes:    Simvastatin 40 Mg Tabs (Simvastatin) .Brendan Meyer... Take 1 tablet daily  Orders: EKG w/ Interpretation (93000)  Patient Instructions: 1)  Your physician recommends that you schedule a follow-up appointment in: 2 months with Dr.  Gala Romney 2)  Your physician recommends that you return for a FASTING lipid profile and liver in 2 months.272.4, 414.01 3)  Your physician has requested that you limit the intake of sodium (salt) in your diet to two grams daily. Please see [facility] handout. 4)  Your physician has recommended you make the following change in your medication: STOP Lipitor  START ( Zocor) Simvastatin

## 2010-08-06 NOTE — Miscellaneous (Signed)
  Clinical Lists Changes  Observations: Added new observation of CARDCATHFIND:  1. Moderate reduction in left ventricular function with a predominant       wall motion abnormality involving the septum and apex.   2. Diffuse severe disease involving the left anterior descending       artery in its mid and distal portion.   3. Moderate tapered narrowing in the ostium of the left main, and       probable ostial stenosis of the circumflex.   4. High-grade disease of the distal right coronary artery and proximal       posterior descending artery with a large distribution distally.      DISPOSITION:  I have spoken with Dr. Laneta Simmers who will review the films.   The patient from a physical standpoint is a candidate for   revascularization surgery.  His distal LAD is not ideal nor is a   circumflex vessel size ideal for grafting.  The right coronary artery   could be nicely grafted.  We will await surgical opinion.        (02/05/2010 15:00)      Cardiac Cath  Procedure date:  02/05/2010  Findings:       1. Moderate reduction in left ventricular function with a predominant       wall motion abnormality involving the septum and apex.   2. Diffuse severe disease involving the left anterior descending       artery in its mid and distal portion.   3. Moderate tapered narrowing in the ostium of the left main, and       probable ostial stenosis of the circumflex.   4. High-grade disease of the distal right coronary artery and proximal       posterior descending artery with a large distribution distally.      DISPOSITION:  I have spoken with Dr. Laneta Simmers who will review the films.   The patient from a physical standpoint is a candidate for   revascularization surgery.  His distal LAD is not ideal nor is a   circumflex vessel size ideal for grafting.  The right coronary artery   could be nicely grafted.  We will await surgical opinion.

## 2010-08-06 NOTE — Procedures (Signed)
Summary: Upper Endoscopy  Patient: [name] Note: All result statuses are Final unless otherwise noted.  Tests: (1) Upper Endoscopy (EGD)   EGD Upper Endoscopy       DONE     [facility]     [address]     [place], [place]  [address]           ENDOSCOPY PROCEDURE REPORT           PATIENT:  Brendan Meyer, Brendan Meyer  MR#:  [id number]     BIRTHDATE:  03-Dec-1934, 74 yrs. old  GENDER:  male           ENDOSCOPIST:  Wilhemina Bonito. Eda Keys, MD     Referred by:  Adrian Prince, M.D.           PROCEDURE DATE:  07/13/2009     PROCEDURE:  EGD, diagnostic     ASA CLASS:  Class II     INDICATIONS:  nausea, abdominal discomfort           MEDICATIONS:   There was residual sedation effect present from     prior procedure., Fentanyl 25 mcg IV, Versed 2 mg IV     TOPICAL ANESTHETIC:  Exactacain Spray           DESCRIPTION OF PROCEDURE:   After the risks benefits and     alternatives of the procedure were thoroughly explained, informed     consent was obtained.  The LB [id number] endoscope was     introduced through the mouth and advanced to the third portion of     the duodenum, without limitations.  The instrument was slowly     withdrawn as the mucosa was fully examined.     <<PROCEDUREIMAGES>>           Mild Esophagitis with edema and early stricture formation was     found in the distal esophagus.  Otherwise the examination was     normal to D3.    Retroflexed views revealed no abnormalities.     The scope was then withdrawn from the patient and the procedure     completed.           COMPLICATIONS:  None           ENDOSCOPIC IMPRESSION:     1) Esophagitis in the distal esophagus     2) Otherwise normal examination           RECOMMENDATIONS:     1) Prilosec OTC 20mg  daily     2) Phenergan 12.5mg ; #60; 1-2 po q6 hrs prn nausea     3) Schedule Contrast CT Abd/Pelvis "abd pain and wt loss"     4) Make office follow up appointment with Dr [name] for 4 weeks        ______________________________     Wilhemina Bonito. Eda Keys, MD           CC:  Adrian Prince, MD, The Patient           n.     eSIGNED:   Wilhemina Bonito. Eda Keys at 07/13/2009 09:09 AM           Vanessa Ralphs, [id number]  Note: An exclamation mark (!) indicates a result that was not dispersed into the flowsheet. Document Creation Date: 07/13/2009 9:09 AM _______________________________________________________________________  (1) Order result status: Final Collection or observation date-time: 07/13/2009 08:52 Requested date-time:  Receipt date-time:  Reported date-time:  Referring Physician:   Ordering Physician:  Fransico Setters [phone]) Specimen Source:  Source: Launa Grill Order Number: [id number] Lab site:

## 2010-08-06 NOTE — Miscellaneous (Signed)
  Clinical Lists Changes  Observations: Added new observation of US CAROTID:  - No significant extracranial carotid artery stenosis demonstrated.     Veterbrals are patent with antegrade flow.   - Normal arterial flow bilaterally. (02/06/2010 15:01)      Carotid Doppler  Procedure date:  02/06/2010  Findings:       - No significant extracranial carotid artery stenosis demonstrated.     Veterbrals are patent with antegrade flow.   - Normal arterial flow bilaterally.

## 2010-08-06 NOTE — Medication Information (Signed)
Summary: [facility] Medication Clarification Needed Form   [facility] Medication Clarification Needed Form   Imported By: Roderic Ovens 04/17/2010 10:58:43  _____________________________________________________________________  External Attachment:    Type:   Image     Comment:   External Document

## 2010-08-06 NOTE — Miscellaneous (Signed)
Summary: [facility] Progress Note   [facility] Progress Note   Imported By: Roderic Ovens 05/13/2010 09:11:43  _____________________________________________________________________  External Attachment:    Type:   Image     Comment:   External Document

## 2010-08-06 NOTE — Assessment & Plan Note (Signed)
Summary: ABD PAIN and nausea   History of Present Illness Visit Type: Initial Consult Primary GI MD: Yancey Flemings MD Primary Provider: Adrian Prince, MD Requesting Provider: Adrian Prince, MD Chief Complaint: Patient states that he is having some lower abdominal pain that started in September. The pain starts about an hour after he eats. He states that he has some nasuea over the last few days. Patient denies and change in his bowels or blood in stool.  History of Present Illness:   76 year old white male with a history of diabetes mellitus, hyperlipidemia, neuropathy, BPH, depression, and kidney stones. He sent today regarding lower abdominal symptoms, nausea, and weight loss. He is accompanied by his wife. He reports a four-month history of an uneasiness or nausea in the lower abdomen/suprapubic region. He states that he eats, the symptoms improve for about one hour, then returned. He rarely has cramping discomfort in the lower abdomen which she describes as gas pains. He was placed on Dexilant without improvement. Review of outside laboratories from Dr. Rinaldo Cloud office dated May 22, 2009 revealed a normal CBC, liver function panel, amylase, and lipase. He states that he is having decreased appetite and has lost about 6-7 pounds. He describes his bowel habits is normal in regular and without change. There has been no GI bleeding. He denies vomiting. Some bloating and belching. No GERD symptoms. No prior history of GI problems or GI evaluations. No prior colonoscopy. He denies new medications.. He does mention urinary frequency and discomfort. No  hematuria   GI Review of Systems    Reports belching, bloating, loss of appetite, and  nausea.      Denies abdominal pain, acid reflux, chest pain, dysphagia with liquids, dysphagia with solids, heartburn, vomiting, vomiting blood, weight loss, and  weight gain.        Denies anal fissure, black tarry stools, change in bowel habit, constipation,  diarrhea, diverticulosis, fecal incontinence, heme positive stool, hemorrhoids, irritable bowel syndrome, jaundice, light color stool, liver problems, rectal bleeding, and  rectal pain. Preventive Screening-Counseling & Management  Alcohol-Tobacco     Smoking Status: never      Drug Use:  no.      Current Medications (verified): 1)  Amaryl 2 Mg Tabs (Glimepiride) .Marland Kitchen.. 1 By Mouth Two Times A Day 2)  Janumet 50-500 Mg Tabs (Sitagliptin-Metformin Hcl) .Marland Kitchen.. 1 By Mouth Two Times A Day 3)  Zocor 20 Mg Tabs (Simvastatin) .... Take One By Mouth Once Daily 4)  Ambien 10 Mg Tabs (Zolpidem Tartrate) .... Take One By Mouth At Bedtime As Needed 5)  Hydrocodone-Acetaminophen 5-500 Mg Tabs (Hydrocodone-Acetaminophen) .... Take One By Mouth As Needed  Allergies (verified): No Known Drug Allergies  Past History:  Past Medical History: Reviewed history from 07/11/2009 and no changes required. Neuropathy Hyperlipidemia Depression Bells Palsy BPH Kidney Stones Diabetes  Past Surgical History: Eardrum Surgery Tonsillectomy ESWL Hernia Surgery double  Family History: CVA/MI-Mother died No FH of Colon Cancer: patient did not know his bilogical father Family History of Breast Cancer: maternal aunt  Social History: Occupation: retired married with one son and two daughters Patient has never smoked.  Alcohol Use - no Daily Caffeine Use 2 per day Illicit Drug Use - no Smoking Status:  never Drug Use:  no  Review of Systems       depression, fatigue, urinary pain/frequency. All other systems reviewed and reported to be negative  Vital Signs:  Patient profile:   76 year old male Height:  71 inches Weight:      152.8 pounds BMI:     21.39 Pulse rate:   80 / minute Pulse rhythm:   regular BP sitting:   120 / 64  (right arm) Cuff size:   regular  Vitals Entered By: Harlow Mares CMA Duncan Dull) (July 12, 2009 11:15 AM)  Physical Exam  General:  Well developed, well  nourished, no acute distress. Head:  Normocephalic and atraumatic. Eyes:  PERRLA, no icterus. Nose:  No deformity, discharge,  or lesions. Mouth:  no lesions or oral ulcers Neck:  Supple; no masses or thyromegaly. Lungs:  Clear throughout to auscultation. Heart:  Regular rate and rhythm; no murmurs, rubs,  or bruits. Abdomen:  Soft, nontender and nondistended. No masses, hepatosplenomegaly or hernias noted. Normal bowel sounds. Rectal:  deferred Msk:  Symmetrical with no gross deformities. Normal posture. Pulses:  Normal pulses noted. Extremities:  No clubbing, cyanosis, edema or deformities noted. Neurologic:  Alert and  oriented x4;  grossly normal neurologically. Skin:  Intact without significant lesions or rashes. Psych:  Alert and cooperative. Normal mood and affect.   Impression & Recommendations:  Problem # 1:  ABDOMINAL UNCOMFORTABLEN ESS  (ICD-789.07) The patient presents with lower abdominal am comfortableness or uneasiness  associated with nausea (787.02), anorexia, and weight loss (783.2). Etiology of his symptoms aren't clear.  Plan: #1. Colonoscopy to evaluate lower abdominal discomfort, weight loss, and to provide colorectal neoplasia screening. The nature of the procedure as well as risks, benefits, and alternatives were reviewed in detail. He understood and agreed to proceed. #2. Movi prep prescribed. The patient instructed on its use. #3. Upper endoscopy to evaluate abdominal discomfort, anorexia, and weight loss. The nature of the procedure as well as the risks, benefits, and alternatives were reviewed. He understood and agreed to proceed. #4. Hold oral diabetic medications the morning of the procedure until he has resumed normal p.o. intake. This to avoid unwanted hypoglycemia. As well, we will monitor his blood sugar immediately prior to and after his procedure.  Problem # 2:  WEIGHT LOSS-ABNORMAL (ICD-783.21) likely secondary to anorexia. We will evaluate as noted  above.  Problem # 3:  NAUSEA (ICD-787.02) rule out organic versus functional cause. Also could consider medication side effect. We will for this further after his endoscopic evaluations are completed  Problem # 4:  DIABETES MELLITUS (ICD-250.00) medications need to be adjusted for his procedure. This is outlined above  Other Orders: Colon/Endo (Colon/Endo)  Patient Instructions: 1)  Colon/Endo [facility] 07/13/09 8:00 am 2)  Movi prep instructions given to patient. 3)  Movi prep Rx. sent to pharmacy. 4)  Colonoscopy and Flexible Sigmoidoscopy brochure given.  5)  Upper Endoscopy brochure given.  6)  Hold oral diabetic meds morning of procedure. 7)  The medication list was reviewed and reconciled.  All changed / newly prescribed medications were explained.  A complete medication list was provided to the patient / caregiver. 8)  Copy: Dr. Adrian Prince Prescriptions: MOVIPREP 100 GM  SOLR (PEG-KCL-NACL-NASULF-NA ASC-C) As per prep instructions.  #1 x 0   Entered by:   Milford Cage NCMA   Authorized by:   Hilarie Fredrickson MD   Signed by:   Milford Cage NCMA on 07/12/2009   Method used:   Electronically to        [facility]* (retail)       [phone] [facility],  [place]  [address]       Ph: [phone]       Fax: [phone]   RxID:   [id number]

## 2010-08-06 NOTE — Cardiovascular Report (Signed)
Summary: Pre-Cath Orders  Pre-Cath Orders   Imported By: Marylou Mccoy 02/21/2010 09:21:23  _____________________________________________________________________  External Attachment:    Type:   Image     Comment:   External Document

## 2010-08-06 NOTE — Progress Notes (Signed)
Summary: Question about medication  Phone Note Other Incoming Call back at [phone]   Caller: [facility]/ [facility] Summary of Call: Calling about the medication Initial call taken by: Judie Grieve,  March 20, 2010 1:43 PM  Follow-up for Phone Call        pt question whether to continue iron and folic acid. will confirm w/PA. Claris Gladden, RN PA adv ok to continue meds. Claris Gladden, RN  Additional Follow-up for Phone Call Additional follow up Details #1::        thats fine. Dolores Patty, MD, [facility]  March 22, 2010 1:10 PM

## 2010-08-06 NOTE — Miscellaneous (Signed)
Summary: [facility] Order/Treatment Plan  [facility] Order[facility]Treatment Plan   Imported By: Roderic Ovens 02/25/2010 10:16:05  _____________________________________________________________________  External Attachment:    Type:   Image     Comment:   External Document

## 2010-08-06 NOTE — Assessment & Plan Note (Signed)
Summary: per check out/sf   Visit Type:  Follow-up Referring Provider:  Adrian Prince, MD Primary Provider:  Adrian Prince, MD  CC:  R side discomfort (muscle?).  History of Present Illness: Brendan Meyer is a 76 year old with h/o DM2, HL who underwent CABG x6 with LIMA to the LAD, SVG sequential to the diagonal one and OM1, SVG to the fourth diagonal, SVG to the proximal PDA and distal PDA, and closure of patent foramen ovale February 14, 2010. EF 35-40% on pre-op echo.  The patient had postoperative fibrillation with rapid ventricular response treated with amiodarone and beta blocker. Amio recently stopped.  Returns for routine f/u. Doing extremely well. Continues with cardiac rehab and a bit frustrated that they are not allwoing to work harded and get his HR up higher. No CP or dyspnea. Yesterday had a little pain in his right chest/armpit which he feels in musculoskeletal. No HF or palpitations.  No problems with his meds.      Current Medications (verified): 1)  Simvastatin 40 Mg Tabs (Simvastatin) .... Take 1 Tablet Daily 2)  Metformin Hcl 500 Mg Tabs (Metformin Hcl) .Marland Kitchen.. 1 By Mouth Two Times A Day 3)  Folic Acid 1 Mg Tabs (Folic Acid) .... Take 1 Tablet Each Morning 4)  Metoprolol Tartrate 25 Mg Tabs (Metoprolol Tartrate) .... Take 1/2 Tablet By Mouth Twice A Day 5)  Aspirin Ec 325 Mg Tbec (Aspirin) .... Take One Tablet By Mouth Daily 6)  Ferrex 150 150 Mg Caps (Polysaccharide Iron Complex) .... Take 1 Capsule Daily 7)  Gas-X Extra Strength 125 Mg Caps (Simethicone) .... As Needed 8)  Omeprazole 20 Mg Cpdr (Omeprazole) .... Two Times A Day 9)  Ambien 10 Mg Tabs (Zolpidem Tartrate) .... At Bedtime As Needed 10)  Lantus 100 Unit/ml Soln (Insulin Glargine) .... As Directed  Allergies (verified): No Known Drug Allergies  Past History:  Past Medical History: 1. Neuropathy 2. Hyperlipidemia 3. Depression 4. Bells Palsy 5. BPH 6. Kidney Stones 7. Diabetes 8. echo 01/2010    --ef  35-40% septal/apical wma 9. CAD s/p CABG  Review of Systems       As per HPI and past medical history; otherwise all systems negative.   Vital Signs:  Patient profile:   76 year old male Height:      71 inches Weight:      151 pounds BMI:     21.14 Pulse rate:   67 / minute BP sitting:   98 / 54  (left arm) Cuff size:   regular  Vitals Entered By: Brendan Meyer, RMA (May 01, 2010 11:16 AM)  Physical Exam  General:  thin in no acute distress. Neck: No JVD, HJR, Bruit, or thyroid enlargement Lungs: No tachypnea, clear without wheezing, rales, or rhonchi Cardiovascular: Incision healed well,RRR, PMI not displaced, 2/6 systolic murmur at the left sternal border, no  gallops, bruit, thrill, or heave. Abdomen: BS normal. Soft without organomegaly, masses, lesions or tenderness. Extremities: without cyanosis, clubbing or edema. Good distal pulses bilateral SKin: Warm, no lesions or rashes  Musculoskeletal: No deformities Neuro: no focal signs    Impression & Recommendations:  Problem # 1:  CAD, ARTERY BYPASS GRAFT (ICD-414.04) Stable. No evidence of ischemia. Continue current regimen. Continue exzercise training. Will contact cardiac rehab about bumping up his HR training zone.  Problem # 2:  CARDIOMYOPATHY, ISCHEMIC (ICD-414.8) No evidence of HF. Ideally would like to add ACE-I (especially with DM20 but BP low. Will repeat echo to see if EF recovered  with revascularization. Will consider low0dse ACE-i or ARB at next visit.   Other Orders: Echocardiogram (Echo)  Patient Instructions: 1)  Your physician has requested that you have an echocardiogram.  Echocardiography is a painless test that uses sound waves to create images of your heart. It provides your doctor with information about the size and shape of your heart and how well your heart's chambers and valves are working.  This procedure takes approximately one hour. There are no restrictions for this procedure. 2)   Follow up in 4 months

## 2010-08-06 NOTE — Miscellaneous (Signed)
Summary: new rx  Clinical Lists Changes  Medications: Added new medication of PROMETHAZINE HCL 12.5 MG  TABS (PROMETHAZINE HCL) 1-2 by mouth q 6 hr as needed for nausea - Signed Rx of PROMETHAZINE HCL 12.5 MG  TABS (PROMETHAZINE HCL) 1-2 by mouth q 6 hr as needed for nausea;  #60 x 0;  Signed;  Entered by: Weston Brass;  Authorized by: Hilarie Fredrickson MD;  Method used: Electronically to [facility] [address] [place], [place], [place]  [address], Ph: [phone], Fax: [phone] Observations: Added new observation of NKA: T (07/13/2009 9:19)    Prescriptions: PROMETHAZINE HCL 12.5 MG  TABS (PROMETHAZINE HCL) 1-2 by mouth q 6 hr as needed for nausea  #60 x 0   Entered by:   Weston Brass   Authorized by:   Hilarie Fredrickson MD   Signed by:   Weston Brass on 07/13/2009   Method used:   Electronically to        [facility]* (retail)       [address] [place], [place]  [address]       Ph: [phone]       Fax: [phone]   RxID:   [id number]

## 2010-08-06 NOTE — Letter (Signed)
Summary: [facility] Office Visit Note   [facility] Office Visit Note   Imported By: Roderic Ovens 01/22/2010 13:14:51  _____________________________________________________________________  External Attachment:    Type:   Image     Comment:   External Document

## 2010-08-06 NOTE — Letter (Signed)
Summary: [facility] Office Visit Note   [facility] Office Visit Note   Imported By: Roderic Ovens 04/24/2010 16:32:08  _____________________________________________________________________  External Attachment:    Type:   Image     Comment:   External Document

## 2010-08-06 NOTE — Progress Notes (Signed)
Summary: CT results request  Phone Note Call from Patient Call back at Home Phone [phone]   Caller: Patient Call For: Dr. [name] Reason for Call: Lab or Test Results Summary of Call: would like someone to go over his CT results from today Initial call taken by: Vallarie Mare,  July 20, 2009 3:11 PM  Follow-up for Phone Call        Pt. told I will call him back on Monday after review of CT results by Dr.Tamiya Colello when he returns to the office. Follow-up by: Teryl Lucy RN,  July 20, 2009 3:26 PM

## 2010-08-06 NOTE — Miscellaneous (Signed)
Summary: [facility] Progress Note   [facility] Progress Note   Imported By: Roderic Ovens 04/30/2010 08:53:00  _____________________________________________________________________  External Attachment:    Type:   Image     Comment:   External Document

## 2010-08-06 NOTE — Progress Notes (Signed)
Summary: Question about medication  Phone Note Call from Patient Call back at Home Phone [phone]   Caller: Faythe Casa Summary of Call: Question about a medication Initial call taken by: Judie Grieve,  April 11, 2010 3:53 PM  Follow-up for Phone Call        04/11/10--1600pm--pt's wife stating that their pharmacist told them there could be liver reaction  when taking pacerone and simvastatin in combination--advised we follow that carefully with blood work, but i would be sure i alerted dr Hannia Matchett and if any changes were required i would have have dr Correll Denbow 's nurse call them--nt Follow-up by: Ledon Snare, RN,  April 11, 2010 4:10 PM     Appended Document: Question about medication 04/11/10--1630pm--dr Matti Minney--spoke with [facility] to see if they had received message about d/cing amiordarone and continuing with simvastatin 40mg  once daily--amanda called pharmacy and gave them correct info on amiordarone and simvastatin, but had not notified family--i have since talked with wife and she states she understands changes--nt  Appended Document: Question about medication per Dr Gala Romney pt is in SR now, amio stopped   Clinical Lists Changes  Medications: Removed medication of PACERONE 200 MG TABS (AMIODARONE HCL) Take 1 tablet by mouth daily

## 2010-08-06 NOTE — Letter (Signed)
Summary: [employer]  [employer]   Imported By: Marylou Mccoy 05/06/2010 10:34:48  _____________________________________________________________________  External Attachment:    Type:   Image     Comment:   External Document

## 2010-08-06 NOTE — Letter (Signed)
Summary: [facility] - BMP/TSH  [facility] - BMP/TSH   Imported By: Roderic Ovens 05/23/2010 16:03:52  _____________________________________________________________________  External Attachment:    Type:   Image     Comment:   External Document

## 2010-08-08 NOTE — Letter (Signed)
Summary: [facility]   Imported By: Marylou Mccoy 07/18/2010 09:03:58  _____________________________________________________________________  External Attachment:    Type:   Image     Comment:   External Document

## 2010-08-08 NOTE — Medication Information (Signed)
Summary: Omeprazole/[facility]  Omeprazole/[facility]   Imported By: Sherian Rein 07/09/2010 11:03:53  _____________________________________________________________________  External Attachment:    Type:   Image     Comment:   External Document

## 2010-09-03 ENCOUNTER — Ambulatory Visit (INDEPENDENT_AMBULATORY_CARE_PROVIDER_SITE_OTHER): Payer: 59 | Admitting: Internal Medicine

## 2010-09-03 ENCOUNTER — Encounter: Payer: Self-pay | Admitting: Internal Medicine

## 2010-09-03 DIAGNOSIS — I2510 Atherosclerotic heart disease of native coronary artery without angina pectoris: Secondary | ICD-10-CM

## 2010-09-12 NOTE — Assessment & Plan Note (Signed)
Summary: 4 mth rov/sl   Visit Type:  Follow-up Referring Provider:  Adrian Prince, MD Primary Provider:  Adrian Prince, MD  CC:  No complaints.  History of Present Illness: Brendan Meyer is a 76 year old with h/o DM2, HL who underwent CABG x6 with LIMA to the LAD, SVG sequential to the diagonal one and OM1, SVG to the fourth diagonal, SVG to the proximal PDA and distal PDA, and closure of patent foramen ovale February 14, 2010. EF 35-40% on pre-op echo.  F/u echo on 11/11: EF 50-55%.   Returns for routine f/u. Doing extremely well. Not [facility] as [facility] to. Very active around the house without problems - though he did not have many ischemic symptoms. No CP, SOB or HF. Having problem with gas and GI symtpoms.     Current Medications (verified): 1)  Simvastatin 40 Mg Tabs (Simvastatin) .... Take 1 Tablet Daily 2)  Metformin Hcl 500 Mg Tabs (Metformin Hcl) .Marland Kitchen.. 1 By Mouth Two Times A Day 3)  Folic Acid 1 Mg Tabs (Folic Acid) .... Take 1 Tablet Each Morning 4)  Metoprolol Tartrate 25 Mg Tabs (Metoprolol Tartrate) .... Take 1/2 Tablet By Mouth Twice A Day 5)  Aspirin Ec 325 Mg Tbec (Aspirin) .... Take One Tablet By Mouth Daily 6)  Ferrex 150 150 Mg Caps (Polysaccharide Iron Complex) .... Take 1 Capsule Daily 7)  Gas-X Extra Strength 125 Mg Caps (Simethicone) .... As Needed 8)  Omeprazole 20 Mg Cpdr (Omeprazole) .... Take 1 Capsule By Mouth Once A Day 9)  Ambien 10 Mg Tabs (Zolpidem Tartrate) .... At Bedtime As Needed 10)  Lantus 100 Unit/ml Soln (Insulin Glargine) .... As Directed 11)  Ramipril 5 Mg Caps (Ramipril) .... Take One Capsule By Mouth Daily  Allergies (verified): No Known Drug Allergies  Review of Systems       As per HPI and past medical history; otherwise all systems negative.   Vital Signs:  Patient profile:   76 year old male Height:      71 inches Weight:      159.25 pounds BMI:     22.29 Pulse rate:   80 / minute Pulse rhythm:   regular Resp:     18 per  minute BP sitting:   106 / 70  (left arm) Cuff size:   large  Vitals Entered By: Vikki Ports (September 03, 2010 10:53 AM)  Physical Exam  General:  thin in no acute distress. Neck: No JVD, HJR, Bruit, or thyroid enlargement Lungs: No tachypnea, clear without wheezing, rales, or rhonchi Cardiovascular: Incision healed well,RRR, PMI not displaced, 2/6 systolic murmur at the left sternal border, no  gallops, bruit, thrill, or heave. Abdomen: BS normal. Soft without organomegaly, masses, lesions or tenderness. Extremities: without cyanosis, clubbing or edema. Good distal pulses bilateral SKin: Warm, no lesions or rashes  Musculoskeletal: No deformities Neuro: no focal signs    Impression & Recommendations:  Problem # 1:  CAD, ARTERY BYPASS GRAFT (ICD-414.04) Doing very well. Encouraged to get back to his regular exercise schedule. Given his lack of anginal symptoms in past will see him back for ETT in 6 months.  Patient Instructions: 1)  Your physician wants you to follow-up in:6 Months   You will receive a reminder letter in the mail two months in advance. If you don't receive a letter, please call our office to schedule the follow-up appointment. 2)  Your physician has requested that you have an exercise tolerance test.  For  further information please visit [email or web address]  Please also follow instruction sheet, as given.in 6 months

## 2010-09-17 LAB — GLUCOSE, CAPILLARY: Glucose-Capillary: 228 mg/dL — ABNORMAL HIGH (ref 70–99)

## 2010-09-19 LAB — GLUCOSE, CAPILLARY
Glucose-Capillary: 130 mg/dL — ABNORMAL HIGH (ref 70–99)
Glucose-Capillary: 153 mg/dL — ABNORMAL HIGH (ref 70–99)

## 2010-09-20 LAB — BASIC METABOLIC PANEL
BUN: 12 mg/dL (ref 6–23)
BUN: 13 mg/dL (ref 6–23)
BUN: 17 mg/dL (ref 6–23)
BUN: 17 mg/dL (ref 6–23)
CO2: 23 mEq/L (ref 19–32)
CO2: 25 mEq/L (ref 19–32)
CO2: 26 mEq/L (ref 19–32)
Calcium: 7.5 mg/dL — ABNORMAL LOW (ref 8.4–10.5)
Calcium: 8.3 mg/dL — ABNORMAL LOW (ref 8.4–10.5)
Calcium: 8.3 mg/dL — ABNORMAL LOW (ref 8.4–10.5)
Calcium: 9.6 mg/dL (ref 8.4–10.5)
Chloride: 105 mEq/L (ref 96–112)
Chloride: 108 mEq/L (ref 96–112)
Chloride: 113 mEq/L — ABNORMAL HIGH (ref 96–112)
Creatinine, Ser: 0.89 mg/dL (ref 0.4–1.5)
Creatinine, Ser: 1.04 mg/dL (ref 0.4–1.5)
Creatinine, Ser: 1.06 mg/dL (ref 0.4–1.5)
GFR calc Af Amer: >60 mL/min (ref >60)
GFR calc Af Amer: >60 mL/min (ref >60)
GFR calc Af Amer: >60 mL/min (ref >60)
GFR calc non Af Amer: >60 mL/min (ref >60)
GFR calc non Af Amer: >60 mL/min (ref >60)
GFR calc non Af Amer: >60 mL/min (ref >60)
GFR calc non Af Amer: >60 mL/min (ref >60)
Glucose, Bld: 133 mg/dL — ABNORMAL HIGH (ref 70–99)
Glucose, Bld: 144 mg/dL — ABNORMAL HIGH (ref 70–99)
Glucose, Bld: 151 mg/dL — ABNORMAL HIGH (ref 70–99)
Potassium: 3.9 mEq/L (ref 3.5–5.1)
Potassium: 4 mEq/L (ref 3.5–5.1)
Potassium: 4.3 mEq/L (ref 3.5–5.1)
Potassium: 4.4 mEq/L (ref 3.5–5.1)
Sodium: 137 mEq/L (ref 135–145)
Sodium: 139 mEq/L (ref 135–145)
Sodium: 142 mEq/L (ref 135–145)
Sodium: 143 mEq/L (ref 135–145)

## 2010-09-20 LAB — URINALYSIS, ROUTINE W REFLEX MICROSCOPIC
Bilirubin Urine: NEGATIVE
Glucose, UA: >1000 mg/dL — AB
Hgb urine dipstick: NEGATIVE
Ketones, ur: NEGATIVE mg/dL
Leukocytes, UA: NEGATIVE
Nitrite: NEGATIVE
Protein, ur: NEGATIVE mg/dL
Specific Gravity, Urine: 1.02 (ref 1.005–1.030)
Urobilinogen, UA: 0.2 mg/dL (ref 0.0–1.0)
pH: 5.5 (ref 5.0–8.0)

## 2010-09-20 LAB — GLUCOSE, CAPILLARY
Glucose-Capillary: 101 mg/dL — ABNORMAL HIGH (ref 70–99)
Glucose-Capillary: 115 mg/dL — ABNORMAL HIGH (ref 70–99)
Glucose-Capillary: 117 mg/dL — ABNORMAL HIGH (ref 70–99)
Glucose-Capillary: 128 mg/dL — ABNORMAL HIGH (ref 70–99)
Glucose-Capillary: 128 mg/dL — ABNORMAL HIGH (ref 70–99)
Glucose-Capillary: 129 mg/dL — ABNORMAL HIGH (ref 70–99)
Glucose-Capillary: 129 mg/dL — ABNORMAL HIGH (ref 70–99)
Glucose-Capillary: 134 mg/dL — ABNORMAL HIGH (ref 70–99)
Glucose-Capillary: 136 mg/dL — ABNORMAL HIGH (ref 70–99)
Glucose-Capillary: 140 mg/dL — ABNORMAL HIGH (ref 70–99)
Glucose-Capillary: 149 mg/dL — ABNORMAL HIGH (ref 70–99)
Glucose-Capillary: 155 mg/dL — ABNORMAL HIGH (ref 70–99)
Glucose-Capillary: 156 mg/dL — ABNORMAL HIGH (ref 70–99)
Glucose-Capillary: 156 mg/dL — ABNORMAL HIGH (ref 70–99)
Glucose-Capillary: 159 mg/dL — ABNORMAL HIGH (ref 70–99)
Glucose-Capillary: 160 mg/dL — ABNORMAL HIGH (ref 70–99)
Glucose-Capillary: 161 mg/dL — ABNORMAL HIGH (ref 70–99)
Glucose-Capillary: 162 mg/dL — ABNORMAL HIGH (ref 70–99)
Glucose-Capillary: 163 mg/dL — ABNORMAL HIGH (ref 70–99)
Glucose-Capillary: 165 mg/dL — ABNORMAL HIGH (ref 70–99)
Glucose-Capillary: 167 mg/dL — ABNORMAL HIGH (ref 70–99)
Glucose-Capillary: 168 mg/dL — ABNORMAL HIGH (ref 70–99)
Glucose-Capillary: 184 mg/dL — ABNORMAL HIGH (ref 70–99)
Glucose-Capillary: 189 mg/dL — ABNORMAL HIGH (ref 70–99)
Glucose-Capillary: 193 mg/dL — ABNORMAL HIGH (ref 70–99)
Glucose-Capillary: 203 mg/dL — ABNORMAL HIGH (ref 70–99)
Glucose-Capillary: 213 mg/dL — ABNORMAL HIGH (ref 70–99)
Glucose-Capillary: 220 mg/dL — ABNORMAL HIGH (ref 70–99)
Glucose-Capillary: 267 mg/dL — ABNORMAL HIGH (ref 70–99)
Glucose-Capillary: 273 mg/dL — ABNORMAL HIGH (ref 70–99)
Glucose-Capillary: 55 mg/dL — ABNORMAL LOW (ref 70–99)
Glucose-Capillary: 60 mg/dL — ABNORMAL LOW (ref 70–99)
Glucose-Capillary: 82 mg/dL (ref 70–99)
Glucose-Capillary: 83 mg/dL (ref 70–99)

## 2010-09-20 LAB — CBC
HCT: 28 % — ABNORMAL LOW (ref 39.0–52.0)
HCT: 28.8 % — ABNORMAL LOW (ref 39.0–52.0)
HCT: 28.8 % — ABNORMAL LOW (ref 39.0–52.0)
HCT: 29.5 % — ABNORMAL LOW (ref 39.0–52.0)
HCT: 29.7 % — ABNORMAL LOW (ref 39.0–52.0)
HCT: 30 % — ABNORMAL LOW (ref 39.0–52.0)
HCT: 33.9 % — ABNORMAL LOW (ref 39.0–52.0)
HCT: 42.7 % (ref 39.0–52.0)
HCT: 45.5 % (ref 39.0–52.0)
Hemoglobin: 10 g/dL — ABNORMAL LOW (ref 13.0–17.0)
Hemoglobin: 10 g/dL — ABNORMAL LOW (ref 13.0–17.0)
Hemoglobin: 11.5 g/dL — ABNORMAL LOW (ref 13.0–17.0)
Hemoglobin: 15.4 g/dL (ref 13.0–17.0)
Hemoglobin: 9.5 g/dL — ABNORMAL LOW (ref 13.0–17.0)
Hemoglobin: 9.7 g/dL — ABNORMAL LOW (ref 13.0–17.0)
Hemoglobin: 9.7 g/dL — ABNORMAL LOW (ref 13.0–17.0)
Hemoglobin: 9.8 g/dL — ABNORMAL LOW (ref 13.0–17.0)
MCH: 28.2 pg (ref 26.0–34.0)
MCH: 28.3 pg (ref 26.0–34.0)
MCH: 28.3 pg (ref 26.0–34.0)
MCH: 28.3 pg (ref 26.0–34.0)
MCH: 28.5 pg (ref 26.0–34.0)
MCH: 28.9 pg (ref 26.0–34.0)
MCH: 29 pg (ref 26.0–34.0)
MCH: 29 pg (ref 26.0–34.0)
MCH: 29.2 pg (ref 26.0–34.0)
MCHC: 32.9 g/dL (ref 30.0–36.0)
MCHC: 33.3 g/dL (ref 30.0–36.0)
MCHC: 33.3 g/dL (ref 30.0–36.0)
MCHC: 33.7 g/dL (ref 30.0–36.0)
MCHC: 33.7 g/dL (ref 30.0–36.0)
MCHC: 33.9 g/dL (ref 30.0–36.0)
MCHC: 33.9 g/dL (ref 30.0–36.0)
MCHC: 34 g/dL (ref 30.0–36.0)
MCV: 83.9 fL (ref 78.0–100.0)
MCV: 84.1 fL (ref 78.0–100.0)
MCV: 85 fL (ref 78.0–100.0)
MCV: 85.2 fL (ref 78.0–100.0)
MCV: 85.4 fL (ref 78.0–100.0)
MCV: 85.7 fL (ref 78.0–100.0)
MCV: 85.7 fL (ref 78.0–100.0)
MCV: 85.8 fL (ref 78.0–100.0)
Platelets: 101 10*3/uL — ABNORMAL LOW (ref 150–400)
Platelets: 103 10*3/uL — ABNORMAL LOW (ref 150–400)
Platelets: 111 10*3/uL — ABNORMAL LOW (ref 150–400)
Platelets: 113 10*3/uL — ABNORMAL LOW (ref 150–400)
Platelets: 142 10*3/uL — ABNORMAL LOW (ref 150–400)
Platelets: 180 10*3/uL (ref 150–400)
Platelets: 97 10*3/uL — ABNORMAL LOW (ref 150–400)
Platelets: 98 10*3/uL — ABNORMAL LOW (ref 150–400)
RBC: 3.28 MIL/uL — ABNORMAL LOW (ref 4.22–5.81)
RBC: 3.36 MIL/uL — ABNORMAL LOW (ref 4.22–5.81)
RBC: 3.36 MIL/uL — ABNORMAL LOW (ref 4.22–5.81)
RBC: 3.44 MIL/uL — ABNORMAL LOW (ref 4.22–5.81)
RBC: 3.53 MIL/uL — ABNORMAL LOW (ref 4.22–5.81)
RBC: 3.53 MIL/uL — ABNORMAL LOW (ref 4.22–5.81)
RBC: 4.04 MIL/uL — ABNORMAL LOW (ref 4.22–5.81)
RBC: 5.17 MIL/uL (ref 4.22–5.81)
RDW: 12.9 % (ref 11.5–15.5)
RDW: 13.1 % (ref 11.5–15.5)
RDW: 13.2 % (ref 11.5–15.5)
RDW: 13.2 % (ref 11.5–15.5)
RDW: 13.4 % (ref 11.5–15.5)
RDW: 13.5 % (ref 11.5–15.5)
RDW: 13.6 % (ref 11.5–15.5)
RDW: 13.6 % (ref 11.5–15.5)
RDW: 13.7 % (ref 11.5–15.5)
WBC: 10.9 10*3/uL — ABNORMAL HIGH (ref 4.0–10.5)
WBC: 12.3 10*3/uL — ABNORMAL HIGH (ref 4.0–10.5)
WBC: 12.4 10*3/uL — ABNORMAL HIGH (ref 4.0–10.5)
WBC: 12.8 10*3/uL — ABNORMAL HIGH (ref 4.0–10.5)
WBC: 14.5 10*3/uL — ABNORMAL HIGH (ref 4.0–10.5)
WBC: 6.1 10*3/uL (ref 4.0–10.5)
WBC: 6.8 10*3/uL (ref 4.0–10.5)
WBC: 7.1 10*3/uL (ref 4.0–10.5)
WBC: 9.6 10*3/uL (ref 4.0–10.5)

## 2010-09-20 LAB — POCT I-STAT 4, (NA,K, GLUC, HGB,HCT)
Glucose, Bld: 110 mg/dL — ABNORMAL HIGH (ref 70–99)
Glucose, Bld: 114 mg/dL — ABNORMAL HIGH (ref 70–99)
Glucose, Bld: 129 mg/dL — ABNORMAL HIGH (ref 70–99)
Glucose, Bld: 149 mg/dL — ABNORMAL HIGH (ref 70–99)
Glucose, Bld: 170 mg/dL — ABNORMAL HIGH (ref 70–99)
Glucose, Bld: 98 mg/dL (ref 70–99)
HCT: 23 % — ABNORMAL LOW (ref 39.0–52.0)
HCT: 24 % — ABNORMAL LOW (ref 39.0–52.0)
HCT: 25 % — ABNORMAL LOW (ref 39.0–52.0)
HCT: 33 % — ABNORMAL LOW (ref 39.0–52.0)
HCT: 33 % — ABNORMAL LOW (ref 39.0–52.0)
HCT: 33 % — ABNORMAL LOW (ref 39.0–52.0)
Hemoglobin: 11.2 g/dL — ABNORMAL LOW (ref 13.0–17.0)
Hemoglobin: 11.2 g/dL — ABNORMAL LOW (ref 13.0–17.0)
Hemoglobin: 11.2 g/dL — ABNORMAL LOW (ref 13.0–17.0)
Hemoglobin: 7.8 g/dL — ABNORMAL LOW (ref 13.0–17.0)
Hemoglobin: 8.2 g/dL — ABNORMAL LOW (ref 13.0–17.0)
Hemoglobin: 8.5 g/dL — ABNORMAL LOW (ref 13.0–17.0)
Potassium: 3.9 mEq/L (ref 3.5–5.1)
Potassium: 4 mEq/L (ref 3.5–5.1)
Potassium: 4.1 mEq/L (ref 3.5–5.1)
Potassium: 4.2 mEq/L (ref 3.5–5.1)
Potassium: 4.4 mEq/L (ref 3.5–5.1)
Potassium: 4.9 mEq/L (ref 3.5–5.1)
Sodium: 135 mEq/L (ref 135–145)
Sodium: 138 mEq/L (ref 135–145)
Sodium: 139 mEq/L (ref 135–145)
Sodium: 140 mEq/L (ref 135–145)
Sodium: 141 mEq/L (ref 135–145)
Sodium: 141 mEq/L (ref 135–145)

## 2010-09-20 LAB — COMPREHENSIVE METABOLIC PANEL
ALT: 13 U/L (ref 0–53)
Alkaline Phosphatase: 40 U/L (ref 39–117)
CO2: 26 mEq/L (ref 19–32)
Chloride: 106 mEq/L (ref 96–112)
Potassium: 4.5 mEq/L (ref 3.5–5.1)
Sodium: 140 mEq/L (ref 135–145)

## 2010-09-20 LAB — POCT I-STAT 3, ART BLOOD GAS (G3+)
Acid-base deficit: 1 mmol/L (ref 0.0–2.0)
Acid-base deficit: 2 mmol/L (ref 0.0–2.0)
Acid-base deficit: 4 mmol/L — ABNORMAL HIGH (ref 0.0–2.0)
Bicarbonate: 21.3 mEq/L (ref 20.0–24.0)
Bicarbonate: 22.5 mEq/L (ref 20.0–24.0)
Bicarbonate: 23.7 mEq/L (ref 20.0–24.0)
O2 Saturation: 100 %
O2 Saturation: 98 %
O2 Saturation: 99 %
Patient temperature: 36.2
Patient temperature: 36.9
TCO2: 23 mmol/L (ref 0–100)
TCO2: 24 mmol/L (ref 0–100)
TCO2: 25 mmol/L (ref 0–100)
pCO2 arterial: 37.2 mmHg (ref 35.0–45.0)
pCO2 arterial: 37.8 mmHg (ref 35.0–45.0)
pCO2 arterial: 39.3 mmHg (ref 35.0–45.0)
pH, Arterial: 7.342 — ABNORMAL LOW (ref 7.350–7.450)
pH, Arterial: 7.386 (ref 7.350–7.450)
pH, Arterial: 7.405 (ref 7.350–7.450)
pO2, Arterial: 102 mmHg — ABNORMAL HIGH (ref 80.0–100.0)
pO2, Arterial: 163 mmHg — ABNORMAL HIGH (ref 80.0–100.0)
pO2, Arterial: 307 mmHg — ABNORMAL HIGH (ref 80.0–100.0)

## 2010-09-20 LAB — PLATELET COUNT: Platelets: 113 10*3/uL — ABNORMAL LOW (ref 150–400)

## 2010-09-20 LAB — CREATININE, SERUM
Creatinine, Ser: 0.93 mg/dL (ref 0.4–1.5)
Creatinine, Ser: 1.18 mg/dL (ref 0.4–1.5)
GFR calc Af Amer: >60 mL/min (ref >60)
GFR calc Af Amer: >60 mL/min (ref >60)
GFR calc non Af Amer: >60 mL/min (ref >60)
GFR calc non Af Amer: >60 mL/min (ref >60)

## 2010-09-20 LAB — POCT I-STAT, CHEM 8
BUN: 12 mg/dL (ref 6–23)
BUN: 19 mg/dL (ref 6–23)
Calcium, Ion: 1.1 mmol/L — ABNORMAL LOW (ref 1.12–1.32)
Calcium, Ion: 1.19 mmol/L (ref 1.12–1.32)
Chloride: 106 mEq/L (ref 96–112)
Chloride: 109 mEq/L (ref 96–112)
Creatinine, Ser: 0.9 mg/dL (ref 0.4–1.5)
Creatinine, Ser: 1.1 mg/dL (ref 0.4–1.5)
Glucose, Bld: 128 mg/dL — ABNORMAL HIGH (ref 70–99)
Glucose, Bld: 184 mg/dL — ABNORMAL HIGH (ref 70–99)
HCT: 28 % — ABNORMAL LOW (ref 39.0–52.0)
HCT: 34 % — ABNORMAL LOW (ref 39.0–52.0)
Hemoglobin: 11.6 g/dL — ABNORMAL LOW (ref 13.0–17.0)
Hemoglobin: 9.5 g/dL — ABNORMAL LOW (ref 13.0–17.0)
Potassium: 4.2 mEq/L (ref 3.5–5.1)
Potassium: 4.3 mEq/L (ref 3.5–5.1)
Sodium: 138 mEq/L (ref 135–145)
Sodium: 143 mEq/L (ref 135–145)
TCO2: 23 mmol/L (ref 0–100)
TCO2: 24 mmol/L (ref 0–100)

## 2010-09-20 LAB — CK TOTAL AND CKMB (NOT AT ARMC)
CK, MB: 14.2 ng/mL (ref 0.3–4.0)
Relative Index: 7 — ABNORMAL HIGH (ref 0.0–2.5)
Total CK: 204 U/L (ref 7–232)

## 2010-09-20 LAB — PROTIME-INR
INR: 1.35 (ref 0.00–1.49)
Prothrombin Time: 12.8 seconds (ref 11.6–15.2)
Prothrombin Time: 16.9 seconds — ABNORMAL HIGH (ref 11.6–15.2)

## 2010-09-20 LAB — POCT I-STAT GLUCOSE
Glucose, Bld: 109 mg/dL — ABNORMAL HIGH (ref 70–99)
Glucose, Bld: 146 mg/dL — ABNORMAL HIGH (ref 70–99)
Operator id: 286331
Operator id: 3406

## 2010-09-20 LAB — BLOOD GAS, ARTERIAL
Acid-base deficit: 0.3 mmol/L (ref 0.0–2.0)
Bicarbonate: 23.5 mEq/L (ref 20.0–24.0)
FIO2: 0.21 %
O2 Saturation: 95.5 %
TCO2: 24.6 mmol/L (ref 0–100)
pCO2 arterial: 35.9 mmHg (ref 35.0–45.0)
pO2, Arterial: 75.1 mmHg — ABNORMAL LOW (ref 80.0–100.0)

## 2010-09-20 LAB — HEMOGLOBIN AND HEMATOCRIT, BLOOD
HCT: 24.4 % — ABNORMAL LOW (ref 39.0–52.0)
Hemoglobin: 8.3 g/dL — ABNORMAL LOW (ref 13.0–17.0)

## 2010-09-20 LAB — CARDIAC PANEL(CRET KIN+CKTOT+MB+TROPI)
CK, MB: 3.1 ng/mL (ref 0.3–4.0)
Relative Index: 2.9 — ABNORMAL HIGH (ref 0.0–2.5)
Total CK: 108 U/L (ref 7–232)

## 2010-09-20 LAB — TYPE AND SCREEN
ABO/RH(D): A POS
Antibody Screen: NEGATIVE

## 2010-09-20 LAB — MAGNESIUM
Magnesium: 2.4 mg/dL (ref 1.5–2.5)
Magnesium: 2.6 mg/dL — ABNORMAL HIGH (ref 1.5–2.5)
Magnesium: 2.8 mg/dL — ABNORMAL HIGH (ref 1.5–2.5)

## 2010-09-20 LAB — SURGICAL PCR SCREEN
MRSA, PCR: NEGATIVE
Staphylococcus aureus: NEGATIVE

## 2010-09-20 LAB — ABO/RH: ABO/RH(D): A POS

## 2010-09-20 LAB — APTT: aPTT: 29 seconds (ref 24–37)

## 2010-09-20 LAB — HEMOGLOBIN A1C
Hgb A1c MFr Bld: 8.4 % — ABNORMAL HIGH (ref <5.7)
Mean Plasma Glucose: 194 mg/dL — ABNORMAL HIGH (ref <117)

## 2010-09-20 LAB — URINE MICROSCOPIC-ADD ON

## 2010-09-20 LAB — HEPARIN LEVEL (UNFRACTIONATED): Heparin Unfractionated: <0.1 IU/mL — ABNORMAL LOW (ref 0.30–0.70)

## 2010-09-22 LAB — GLUCOSE, CAPILLARY: Glucose-Capillary: 169 mg/dL — ABNORMAL HIGH (ref 70–99)

## 2010-11-19 NOTE — Assessment & Plan Note (Signed)
OFFICE VISIT   TABOR, DENHAM  DOB:  1935/06/21                                        March 18, 2010  CHART #:  [id number]   HISTORY:  The patient is a 76 year old gentleman who is status post  coronary artery bypass grafting x6 as well as suture closure of a patent  foramen ovale done on February 11, 2010, for severe coronary artery  occlusive disease.  He is seen on today's date in routine office visit  follow up.  Currently, he reports that he is overall feeling quite well.  He does have some minor aches and pains at times.  He is improving in  regard to his activity level.  Plans are underway to begin the cardiac  rehabilitation program on Wednesday of this week.  He denies fevers,  chills, or other constitutional symptoms.   DIAGNOSTIC TESTS:  Chest x-ray was obtained on today's date.  It reveals  clear lung fields without evidence of effusions, infiltrates, or signs  of congestive failure.   PHYSICAL EXAMINATION:  Vital Signs:  Blood pressure is 116/75, pulse of  76, respirations 16, oxygen saturation 98% on room air.  General  Appearance:  Well-developed adult male in no acute distress.  Pulmonary:  Clear breath sounds bilaterally.  Cardiac:  Regular rate and rhythm.  Normal S1 and S2.  No murmurs, gallops, or rubs.  Abdomen:  Benign.  Extremities:  No edema.  Incisions well healed without evidence of  infection.   ASSESSMENT:  The patient has made excellent improvement over time in his  recovery following coronary artery bypass grafting.  I have encouraged  him to begin driving per usual protocols.  He understands this.  He is  increasing his activity and attending the cardiac rehabilitation program  which we agree with.  He is currently in a diabetic program and appears  to be doing well in regard to active control of his glucose.  He has a  good understanding of risk factors as related to the chronic component  of coronary artery  disease and is working well in this regard with his  physicians as well as trying to educate himself.  We feel at this time  that he will  not require ongoing surgical follow up but we will be certainly happy to  see him at any time should any new issues arise or at request.   Rowe Clack, P.A.-C.   Sherryll Burger  D:  03/18/2010  T:  03/19/2010  Job:  [id number]   cc:   Bevelyn Buckles. Bensimhon, MD

## 2010-12-03 ENCOUNTER — Other Ambulatory Visit: Payer: Self-pay | Admitting: Endocrinology

## 2010-12-03 DIAGNOSIS — G44009 Cluster headache syndrome, unspecified, not intractable: Secondary | ICD-10-CM

## 2010-12-04 ENCOUNTER — Ambulatory Visit
Admission: RE | Admit: 2010-12-04 | Discharge: 2010-12-04 | Disposition: A | Discharge status: Home / Self Care | Payer: 59 | Source: Ambulatory Visit | Attending: Endocrinology | Admitting: Endocrinology

## 2010-12-04 DIAGNOSIS — G44009 Cluster headache syndrome, unspecified, not intractable: Secondary | ICD-10-CM

## 2010-12-04 MED ORDER — IOHEXOL 300 MG/ML  SOLN
75.0000 mL | Freq: Once | INTRAMUSCULAR | Status: AC | PRN
  Administered 2010-12-04: 75 mL via INTRAVENOUS

## 2011-01-28 ENCOUNTER — Telehealth: Payer: Self-pay | Admitting: Internal Medicine

## 2011-01-28 DIAGNOSIS — I2510 Atherosclerotic heart disease of native coronary artery without angina pectoris: Secondary | ICD-10-CM

## 2011-01-28 NOTE — Telephone Encounter (Signed)
Pt due for treadmill/bensimhon in august, since he is booked until December, should he have treadmill august, then fu in dec, or have treadmill and appt same day, and is ok to wait that long?

## 2011-01-30 ENCOUNTER — Other Ambulatory Visit: Payer: Self-pay | Admitting: Internal Medicine

## 2011-01-31 NOTE — Telephone Encounter (Signed)
Pt's wife calling back re previous message re appt and treadmill, hasn't heard back

## 2011-01-31 NOTE — Telephone Encounter (Signed)
Spoke w/pts wife will call her next week to sch GXT

## 2011-02-05 HISTORY — PX: CORONARY ARTERY BYPASS GRAFT: SHX141

## 2011-02-12 ENCOUNTER — Other Ambulatory Visit: Payer: Self-pay | Admitting: Internal Medicine

## 2011-02-20 NOTE — Telephone Encounter (Signed)
GXT ordered, please sch with Brendan Meyer is aware and will sch

## 2011-03-11 ENCOUNTER — Encounter: Payer: Self-pay | Admitting: Physician Assistant

## 2011-03-12 ENCOUNTER — Ambulatory Visit (INDEPENDENT_AMBULATORY_CARE_PROVIDER_SITE_OTHER): Payer: Medicare PPO | Admitting: Physician Assistant

## 2011-03-12 DIAGNOSIS — I2510 Atherosclerotic heart disease of native coronary artery without angina pectoris: Secondary | ICD-10-CM

## 2011-03-12 NOTE — Progress Notes (Signed)
Exercise Treadmill Test  Pre-Exercise Testing Evaluation Rhythm: sinus bradycardia  Rate: 59   PR:  .17 QRS:  .09  QT:  .41 QTc: .40     Test  Exercise Tolerance Test Ordering MD: Arvilla Meres, MD  Interpreting MD:  Tereso Newcomer, PA-C  Unique Test No: 1  Treadmill:  1  Indication for ETT: known ASHD  Contraindication to ETT: No   Stress Modality: exercise - treadmill  Cardiac Imaging Performed: non   Protocol: standard Bruce - maximal  Max BP:  211/76  Max MPHR (bpm):  147 85% MPR (bpm):  124  MPHR obtained (bpm):  150 % MPHR obtained:  101  Reached 85% MPHR (min:sec):  2:45 Total Exercise Time (min-sec):  5:00  Workload in METS:  8.4 Borg Scale: 12  Reason ETT Terminated:  desired heart rate attained    ST Segment Analysis At Rest: normal ST segments - no evidence of significant ST depression With Exercise: no evidence of significant ST depression  Other Information Arrhythmia:  No Angina during ETT:  absent (0) Quality of ETT:  diagnostic  ETT Interpretation:  normal - no evidence of ischemia by ST analysis  Comments: Good exercise tolerance. Normal BP response to exercise. No chest pain. No ST-T changes to suggest ischemia.  Recommendations: Follow up with Dr. Gala Romney as directed.

## 2011-05-12 ENCOUNTER — Ambulatory Visit (HOSPITAL_COMMUNITY)
Admission: RE | Admit: 2011-05-12 | Discharge: 2011-05-12 | Disposition: A | Discharge status: Home / Self Care | Payer: Medicare PPO | Source: Ambulatory Visit | Attending: Internal Medicine | Admitting: Internal Medicine

## 2011-05-12 ENCOUNTER — Encounter (HOSPITAL_COMMUNITY): Payer: Self-pay

## 2011-05-12 DIAGNOSIS — E782 Mixed hyperlipidemia: Secondary | ICD-10-CM | POA: Insufficient documentation

## 2011-05-12 DIAGNOSIS — E785 Hyperlipidemia, unspecified: Secondary | ICD-10-CM

## 2011-05-12 DIAGNOSIS — R229 Localized swelling, mass and lump, unspecified: Secondary | ICD-10-CM | POA: Insufficient documentation

## 2011-05-12 DIAGNOSIS — I2510 Atherosclerotic heart disease of native coronary artery without angina pectoris: Secondary | ICD-10-CM | POA: Insufficient documentation

## 2011-05-12 MED ORDER — LISINOPRIL 5 MG PO TABS: 5.0000 mg | ORAL_TABLET | Freq: Every day | ORAL | Status: DC

## 2011-05-12 NOTE — Assessment & Plan Note (Signed)
Doing very well. Stress test looked good except for HTN response to exercise. However baseline BP fairly low. Given DM2, will try to add on low-dose ACE-I with lisinopril 5 mg daily.

## 2011-05-12 NOTE — Progress Notes (Signed)
HPI:  Brendan Meyer is a 76 year old with h/o DM2, HL who underwent CABG x6 with LIMA to the LAD, SVG sequential to the diagonal one and OM1, SVG to the fourth diagonal, SVG to the proximal PDA and distal PDA, and closure of patent foramen ovale February 14, 2010. EF 35-40% on pre-op echo. F/u echo on 11/11: EF 50-55%.   Had ETT in 9/12. Walked 5 mins and test stopped as he attained PMHR. BP response 211/76. ECG normal.  Returns for routine f/u. Doing extremely well. Still going to [facility]. Very active around the house without problems - raking leaves and other activities. No CP, SOB or HF. In past was having problems with symptomatic hypotension but has not recurred.   Dr. Evlyn Kanner following lipids.    ROS: All systems negative except as listed in HPI, PMH and Problem List.  Past Medical History  Diagnosis Date  . Neuropathy   . Hyperlipidemia   . Depression   . Bell's palsy   . Kidney stones   . Diabetes mellitus   . Coronary artery disease     s/p CABG    Current Outpatient Prescriptions  Medication Sig Dispense Refill  . aspirin 325 MG tablet Take 325 mg by mouth daily.        . folic acid (FOLVITE) 1 MG tablet TAKE ONE TABLET BY MOUTH IN THE MORNING  90 tablet  2  . Iron Combinations (IRON COMPLEX PO) Take 1 tablet by mouth daily.        . metFORMIN (GLUCOPHAGE) 500 MG tablet Take 500 mg by mouth 2 (two) times daily with a meal.        . metoprolol tartrate (LOPRESSOR) 25 MG tablet        . omeprazole (PRILOSEC) 20 MG capsule Take 20 mg by mouth as needed.        . simvastatin (ZOCOR) 40 MG tablet Take 40 mg by mouth at bedtime.           PHYSICAL EXAM: Filed Vitals:   05/12/11 1117  BP: 120/62  Pulse: 68  General:  Well appearing. No resp difficulty HEENT: normal except for pearly nodule on R cheek. Neck: supple. JVP flat. Carotids 2+ bilaterally; no bruits. No lymphadenopathy or thryomegaly appreciated. Cor: PMI normal. Regular rate & rhythm. No rubs, gallops or murmurs. Lungs:  clear Abdomen: soft, nontender, nondistended. No hepatosplenomegaly. No bruits or masses. Good bowel sounds. Extremities: no cyanosis, clubbing, rash, edema Neuro: alert & orientedx3, cranial nerves grossly intact. Moves all 4 extremities w/o difficulty. Affect pleasant.    ASSESSMENT & PLAN:

## 2011-05-12 NOTE — Patient Instructions (Addendum)
Start Lisinopril 5 mg daily  You have been referred to Dr Elmon Else at [facility] located at [address], phone [phone]  Appt Tuesday 06/10/11 at 2:20 pm  Your physician wants you to follow-up in: 1 year.  You will receive a reminder letter in the mail two months in advance. If you don't receive a letter, please call our office to schedule the follow-up appointment.

## 2011-05-12 NOTE — Assessment & Plan Note (Signed)
Suspect may be skin CA. Will refer to derm.

## 2011-05-15 NOTE — Assessment & Plan Note (Signed)
Followed by Dr. Evlyn Kanner. Continue statin. Goal LDL < 70.

## 2011-11-03 ENCOUNTER — Encounter (HOSPITAL_COMMUNITY): Payer: Self-pay

## 2013-11-03 ENCOUNTER — Other Ambulatory Visit: Payer: Self-pay | Admitting: Urology

## 2013-11-03 ENCOUNTER — Encounter (HOSPITAL_COMMUNITY): Payer: Self-pay | Admitting: Pharmacy Technician

## 2013-11-03 NOTE — Progress Notes (Signed)
Need orders in EPIC.  Surgery on 11/11/13.  Preop on 11/08/13 at 0930am.  Thank You.

## 2013-11-04 NOTE — Progress Notes (Signed)
Surgery on 11/11/13.  Preop on 11/08/13 at 0930am.  Need orders in EPIC.  Thank You.

## 2013-11-04 NOTE — Patient Instructions (Addendum)
20     Your procedure is scheduled on:  Friday 11/11/2013  Report to [place] at  53  AM.  Call this number if you have problems the night before or morning of surgery:  [phone]   Remember:  [place] 1/2 OF LANTUS INSULIN -20 UNITS AND NO DIABETIC MEDICATIONS MORNING OF SURGERY!              IF YOU USE CPAP,BRING MASK AND TUBING AM OF SURGERY!             IF YOU DO NOT HAVE YOUR TYPE AND SCREEN DRAWN AT PRE-ADMIT APPOINTMENT, YOU WILL HAVE IT DRAWN AM OF SURGERY!   Do not eat food or drink liquids AFTER MIDNIGHT!  Take these medicines the morning of surgery with A SIP OF WATER: Metoprolol    [facility] IS NOT RESPONSIBLE FOR ANY BELONGINGS OR VALUABLES BROUGHT TO HOSPITAL.  Mar[place] Kitchen  Leave suitcase in the car. After surgery it may be brought to your room.  For patients admitted to the hospital, checkout time is 11:00 AM the day of              Discharge.    DO NOT WEAR JEWELRY,MAKE-UP,LOTIONS,POWDERS,PERFUMES,CONTACTS , DENTURES OR BRIDGEWORK ,AND DO NOT WEAR FALSE EYELASHES                                    Patients discharged the day of surgery will not be allowed to drive home. If going home the same day of surgery, must have someone stay with you  first 24 hrs.at home and arrange for someone to drive you home from the [facility]: spouse-Claudia   Special Instructions:              Please read over the following fact sheets that you were given:             1. Brendan Meyer     [phone]                              [facility] - Preparing for Surgery Before surgery, you can play an important role.  Because skin is not sterile, your skin needs to be as free of germs as possible.  You can reduce the number of germs on your skin by washing with CHG (chlorahexidine  gluconate) soap before surgery.  CHG is an antiseptic cleaner which kills germs and bonds with the skin to continue killing germs even after washing. Please DO NOT use if you have an allergy to CHG or antibacterial soaps.  If your skin becomes reddened/irritated stop using the CHG and inform your nurse when you arrive at Short Stay. Do not shave (including legs and underarms) for at least 48 hours prior to the first CHG shower.  You may shave your face.  Please follow these instructions carefully:  1.  Shower with CHG Soap the night before surgery and the  morning of Surgery.  2.  If you choose to wash your hair, wash your hair first as usual with your  normal  shampoo.  3.  After you shampoo, rinse your hair and body thoroughly to remove the  shampoo.                           4.  Use CHG as you would any other liquid soap.  You can apply chg directly  to the skin and wash                       Gently with a scrungie or clean washcloth.  5.  Apply the CHG Soap to your body ONLY FROM THE NECK DOWN.   Do not use on open                           Wound or open sores. Avoid contact with eyes, ears mouth and genitals (private parts).                        Genitals (private parts) with your normal soap.             6.  Wash thoroughly, paying special attention to the area where your surgery  will be performed.  7.  Thoroughly rinse your body with warm water from the neck down.  8.  DO NOT shower/wash with your normal soap after using and rinsing off  the CHG Soap.                9.  Pat yourself dry with a clean towel.            10.  Wear clean pajamas.            11.  Place clean sheets on your bed the night of your first shower and do not  sleep with pets. Day of Surgery : Do not apply any lotions/deodorants the morning of surgery.  Please wear clean clothes to the hospital/surgery center.  FAILURE TO FOLLOW THESE INSTRUCTIONS MAY RESULT IN THE CANCELLATION OF YOUR SURGERY PATIENT  SIGNATURE_________________________________  NURSE SIGNATURE__________________________________  ________________________________________________________________________   Brendan Meyer  An incentive spirometer is a tool that can help keep your lungs clear and active. This tool measures how well you are filling your lungs with each breath. Taking long deep breaths may help reverse or decrease the chance of developing breathing (pulmonary) problems (especially infection) following:  A long period of time when you are unable to move or be active. BEFORE THE PROCEDURE   If the spirometer includes an indicator to show your best effort, your nurse or respiratory therapist will set it to a desired goal.  If possible, sit up straight or lean slightly forward. Try not to slouch.  Hold the incentive spirometer in an upright position. INSTRUCTIONS FOR USE  1. Sit on the edge of your bed if possible, or sit up as far as you can in bed or on a chair. 2. Hold the incentive spirometer in an upright position. 3. Breathe out normally. 4. Place the mouthpiece in your mouth and seal your lips tightly around it. 5. Breathe in slowly and as deeply as possible, raising the piston or the ball toward the top  of the column. 6. Hold your breath for 3-5 seconds or for as long as possible. Allow the piston or ball to fall to the bottom of the column. 7. Remove the mouthpiece from your mouth and breathe out normally. 8. Rest for a few seconds and repeat Steps 1 through 7 at least 10 times every 1-2 hours when you are awake. Take your time and take a few normal breaths between deep breaths. 9. The spirometer may include an indicator to show your best effort. Use the indicator as a goal to work toward during each repetition. 10. After each set of 10 deep breaths, practice coughing to be sure your lungs are clear. If you have an incision (the cut made at the time of surgery), support your incision when coughing  by placing a pillow or rolled up towels firmly against it. Once you are able to get out of bed, walk around indoors and cough well. You may stop using the incentive spirometer when instructed by your caregiver.  RISKS AND COMPLICATIONS  Take your time so you do not get dizzy or light-headed.  If you are in pain, you may need to take or ask for pain medication before doing incentive spirometry. It is harder to take a deep breath if you are having pain. AFTER USE  Rest and breathe slowly and easily.  It can be helpful to keep track of a log of your progress. Your caregiver can provide you with a simple table to help with this. If you are using the spirometer at home, follow these instructions: [place] IF:   You are having difficultly using the spirometer.  You have trouble using the spirometer as often as instructed.  Your pain medication is not giving enough relief while using the spirometer.  You develop fever of 100.5 F (38.1 C) or higher. SEEK IMMEDIATE MEDICAL CARE IF:   You cough up bloody sputum that had not been present before.  You develop fever of 102 F (38.9 C) or greater.  You develop worsening pain at or near the incision site. MAKE SURE YOU:   Understand these instructions.  Will watch your condition.  Will get help right away if you are not doing well or get worse. Document Released: 11/03/2006 Document Revised: 09/15/2011 Document Reviewed: 01/04/2007 [facility] Patient Information 2014 [place], [place].   ________________________________________________________________________

## 2013-11-08 ENCOUNTER — Encounter (HOSPITAL_COMMUNITY)
Admission: RE | Admit: 2013-11-08 | Discharge: 2013-11-08 | Disposition: A | Discharge status: Home / Self Care | Payer: Medicare PPO | Source: Ambulatory Visit | Attending: Urology | Admitting: Urology

## 2013-11-08 ENCOUNTER — Encounter (HOSPITAL_COMMUNITY): Payer: Self-pay

## 2013-11-08 LAB — CBC
HCT: 43.9 % (ref 39.0–52.0)
HEMOGLOBIN: 14.6 g/dL (ref 13.0–17.0)
MCH: 28.5 pg (ref 26.0–34.0)
MCHC: 33.3 g/dL (ref 30.0–36.0)
MCV: 85.6 fL (ref 78.0–100.0)
Platelets: 222 10*3/uL (ref 150–400)
RBC: 5.13 MIL/uL (ref 4.22–5.81)
RDW: 13.3 % (ref 11.5–15.5)
WBC: 4.8 10*3/uL (ref 4.0–10.5)

## 2013-11-08 NOTE — Progress Notes (Signed)
10/18/2013-AU CT-Stone Protocol and BMET results from [facility] on chart. 10/28/2013-Ct- ABD-/PELVIS with contrast from [facility] on chat.

## 2013-11-11 ENCOUNTER — Encounter (HOSPITAL_COMMUNITY): Payer: Medicare PPO | Admitting: Certified Registered Nurse Anesthetist

## 2013-11-11 ENCOUNTER — Ambulatory Visit (HOSPITAL_COMMUNITY)
Admission: RE | Admit: 2013-11-11 | Discharge: 2013-11-11 | Disposition: A | Discharge status: Home / Self Care | Payer: Medicare PPO | Source: Ambulatory Visit | Attending: Urology | Admitting: Urology

## 2013-11-11 ENCOUNTER — Ambulatory Visit (HOSPITAL_COMMUNITY): Payer: Medicare PPO | Admitting: Certified Registered Nurse Anesthetist

## 2013-11-11 ENCOUNTER — Encounter (HOSPITAL_COMMUNITY): Payer: Self-pay | Admitting: *Deleted

## 2013-11-11 ENCOUNTER — Encounter (HOSPITAL_COMMUNITY)
Admission: RE | Disposition: A | Discharge status: Home / Self Care | Payer: Self-pay | Source: Ambulatory Visit | Attending: Urology

## 2013-11-11 DIAGNOSIS — K219 Gastro-esophageal reflux disease without esophagitis: Secondary | ICD-10-CM | POA: Insufficient documentation

## 2013-11-11 DIAGNOSIS — N1330 Unspecified hydronephrosis: Secondary | ICD-10-CM | POA: Insufficient documentation

## 2013-11-11 DIAGNOSIS — Z7982 Long term (current) use of aspirin: Secondary | ICD-10-CM | POA: Insufficient documentation

## 2013-11-11 DIAGNOSIS — R6889 Other general symptoms and signs: Secondary | ICD-10-CM | POA: Insufficient documentation

## 2013-11-11 DIAGNOSIS — I2589 Other forms of chronic ischemic heart disease: Secondary | ICD-10-CM | POA: Insufficient documentation

## 2013-11-11 DIAGNOSIS — Q6101 Congenital single renal cyst: Secondary | ICD-10-CM | POA: Insufficient documentation

## 2013-11-11 DIAGNOSIS — E119 Type 2 diabetes mellitus without complications: Secondary | ICD-10-CM | POA: Insufficient documentation

## 2013-11-11 DIAGNOSIS — I2510 Atherosclerotic heart disease of native coronary artery without angina pectoris: Secondary | ICD-10-CM | POA: Insufficient documentation

## 2013-11-11 DIAGNOSIS — Z794 Long term (current) use of insulin: Secondary | ICD-10-CM | POA: Insufficient documentation

## 2013-11-11 DIAGNOSIS — Z951 Presence of aortocoronary bypass graft: Secondary | ICD-10-CM | POA: Insufficient documentation

## 2013-11-11 DIAGNOSIS — N400 Enlarged prostate without lower urinary tract symptoms: Secondary | ICD-10-CM | POA: Insufficient documentation

## 2013-11-11 DIAGNOSIS — Z79899 Other long term (current) drug therapy: Secondary | ICD-10-CM | POA: Insufficient documentation

## 2013-11-11 DIAGNOSIS — I4891 Unspecified atrial fibrillation: Secondary | ICD-10-CM | POA: Insufficient documentation

## 2013-11-11 DIAGNOSIS — Z87442 Personal history of urinary calculi: Secondary | ICD-10-CM | POA: Insufficient documentation

## 2013-11-11 HISTORY — PX: CYSTOSCOPY/RETROGRADE/URETEROSCOPY: SHX5316

## 2013-11-11 LAB — GLUCOSE, CAPILLARY
GLUCOSE-CAPILLARY: 103 mg/dL — AB (ref 70–99)
Glucose-Capillary: 101 mg/dL — ABNORMAL HIGH (ref 70–99)

## 2013-11-11 SURGERY — CYSTOSCOPY/RETROGRADE/URETEROSCOPY
Anesthesia: General

## 2013-11-11 MED ORDER — LIDOCAINE HCL (CARDIAC) 20 MG/ML IV SOLN
INTRAVENOUS | Status: DC | PRN
  Administered 2013-11-11: 100 mg via INTRAVENOUS

## 2013-11-11 MED ORDER — IOHEXOL 300 MG/ML  SOLN
INTRAMUSCULAR | Status: DC | PRN
  Administered 2013-11-11: 10 mL via ORAL

## 2013-11-11 MED ORDER — LIDOCAINE HCL (CARDIAC) 20 MG/ML IV SOLN
INTRAVENOUS | Status: AC
  Filled 2013-11-11: qty 5

## 2013-11-11 MED ORDER — FENTANYL CITRATE 0.05 MG/ML IJ SOLN: 25.0000 ug | INTRAMUSCULAR | Status: DC | PRN

## 2013-11-11 MED ORDER — PHENYLEPHRINE HCL 10 MG/ML IJ SOLN
INTRAMUSCULAR | Status: DC | PRN
  Administered 2013-11-11: 80 ug via INTRAVENOUS

## 2013-11-11 MED ORDER — CIPROFLOXACIN IN D5W 400 MG/200ML IV SOLN
400.0000 mg | INTRAVENOUS | Status: AC
  Administered 2013-11-11: 400 mg via INTRAVENOUS

## 2013-11-11 MED ORDER — GLYCOPYRROLATE 0.2 MG/ML IJ SOLN
INTRAMUSCULAR | Status: DC | PRN
  Administered 2013-11-11: 0.2 mg via INTRAVENOUS

## 2013-11-11 MED ORDER — CIPROFLOXACIN IN D5W 400 MG/200ML IV SOLN
INTRAVENOUS | Status: AC
  Filled 2013-11-11: qty 200

## 2013-11-11 MED ORDER — LACTATED RINGERS IV SOLN
INTRAVENOUS | Status: DC
  Administered 2013-11-11: 1000 mL via INTRAVENOUS

## 2013-11-11 MED ORDER — ONDANSETRON HCL 4 MG/2ML IJ SOLN
INTRAMUSCULAR | Status: DC | PRN
  Administered 2013-11-11: 4 mg via INTRAVENOUS

## 2013-11-11 MED ORDER — LACTATED RINGERS IV SOLN: INTRAVENOUS | Status: DC

## 2013-11-11 MED ORDER — SODIUM CHLORIDE 0.9 % IJ SOLN
INTRAMUSCULAR | Status: AC
  Filled 2013-11-11: qty 10

## 2013-11-11 MED ORDER — LIDOCAINE HCL 2 % EX GEL
CUTANEOUS | Status: DC | PRN
  Administered 2013-11-11: 1

## 2013-11-11 MED ORDER — EPHEDRINE SULFATE 50 MG/ML IJ SOLN
INTRAMUSCULAR | Status: AC
  Filled 2013-11-11: qty 1

## 2013-11-11 MED ORDER — LIDOCAINE HCL 2 % EX GEL
CUTANEOUS | Status: AC
  Filled 2013-11-11: qty 10

## 2013-11-11 MED ORDER — PROPOFOL 10 MG/ML IV BOLUS
INTRAVENOUS | Status: DC | PRN
  Administered 2013-11-11: 200 mg via INTRAVENOUS

## 2013-11-11 MED ORDER — BELLADONNA ALKALOIDS-OPIUM 16.2-60 MG RE SUPP
RECTAL | Status: AC
  Filled 2013-11-11: qty 1

## 2013-11-11 MED ORDER — ONDANSETRON HCL 4 MG/2ML IJ SOLN
INTRAMUSCULAR | Status: AC
  Filled 2013-11-11: qty 2

## 2013-11-11 MED ORDER — PROPOFOL 10 MG/ML IV BOLUS
INTRAVENOUS | Status: AC
  Filled 2013-11-11: qty 20

## 2013-11-11 MED ORDER — FENTANYL CITRATE 0.05 MG/ML IJ SOLN
INTRAMUSCULAR | Status: DC | PRN
  Administered 2013-11-11 (×2): 50 ug via INTRAVENOUS

## 2013-11-11 MED ORDER — TAMSULOSIN HCL 0.4 MG PO CAPS: 0.4000 mg | ORAL_CAPSULE | Freq: Every day | ORAL | Status: DC

## 2013-11-11 MED ORDER — GLYCOPYRROLATE 0.2 MG/ML IJ SOLN
INTRAMUSCULAR | Status: AC
  Filled 2013-11-11: qty 1

## 2013-11-11 MED ORDER — FENTANYL CITRATE 0.05 MG/ML IJ SOLN
INTRAMUSCULAR | Status: AC
  Filled 2013-11-11: qty 2

## 2013-11-11 MED ORDER — SODIUM CHLORIDE 0.9 % IR SOLN
Status: DC | PRN
  Administered 2013-11-11: 1000 mL via INTRAVESICAL

## 2013-11-11 MED ORDER — EPHEDRINE SULFATE 50 MG/ML IJ SOLN
INTRAMUSCULAR | Status: DC | PRN
  Administered 2013-11-11 (×3): 10 mg via INTRAVENOUS

## 2013-11-11 SURGICAL SUPPLY — 13 items
BAG URO CATCHER STRL LF (DRAPE) ×3 IMPLANT
CATH URET 5FR 28IN CONE TIP (BALLOONS)
CATH URET 5FR 70CM CONE TIP (BALLOONS) IMPLANT
CLOTH BEACON ORANGE TIMEOUT ST (SAFETY) ×3 IMPLANT
DRAPE CAMERA CLOSED 9X96 (DRAPES) ×3 IMPLANT
GLOVE BIOGEL M STRL SZ7.5 (GLOVE) ×3 IMPLANT
GOWN STRL REUS W/TWL XL LVL3 (GOWN DISPOSABLE) ×3 IMPLANT
GUIDEWIRE STR DUAL SENSOR (WIRE) ×3 IMPLANT
MANIFOLD NEPTUNE II (INSTRUMENTS) ×3 IMPLANT
PACK CYSTO (CUSTOM PROCEDURE TRAY) ×3 IMPLANT
SYRINGE 10CC LL (SYRINGE) ×3 IMPLANT
TUBING CONNECTING 10 (TUBING) ×3 IMPLANT
WIRE COONS/BENSON .038X145CM (WIRE) IMPLANT

## 2013-11-11 NOTE — Op Note (Signed)
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 Cystoscopy 2. Left ureteral washing for cytology 3. Bilateral retrograde pyelograms with interpretations 4. Left ureteroscopy   Surgeon: Ardis Hughs, MD  Anesthesia: General  Complications: None  Intraoperative findings: Golf hole left ureteral orifice, retrograde revealed dilated left ureter and left renal pelvis with no appreciable filling defects, on the right side the retrograde showed no filling defects within normal caliber ureter and right renal pelvis.   EBL: Minimal  Specimens: Left ureteral washing for cytology  Indication: Brendan Meyer is a 79 y.o. patient with microscopic hematuria and left\abdominal pain. Workup revealed a left hydronephrotic kidney and the ureter with a filling defect in the distal left ureter.  After reviewing the management options for treatment, he elected to proceed with the above surgical procedure(s). We have discussed the potential benefits and risks of the procedure, side effects of the proposed treatment, the likelihood of the patient achieving the goals of the procedure, and any potential problems that might occur during the procedure or recuperation. Informed consent has been obtained.  Description of procedure:  The patient was taken to the operating room and general anesthesia was induced.  The patient was placed in the dorsal lithotomy position, prepped and draped in the usual sterile fashion, and preoperative antibiotics were administered. A preoperative time-out was performed.   Cystourethroscopy was performed.  The patient's urethra was examined and was normal, with  bilobar prostatic hypertrophy with a high/large median lobe. The bladder was  then systematically examined in its entirety. There was no evidence for any bladder tumors, stones, or other mucosal pathology.   The left ureteral orifice was posterior widely refluxing, much like a Hutch diverticulum or a golf hole ureteral orifice.  Attention then turned to the leftureteral orifice and a ureteral catheter was used to intubate the ureteral orifice. The catheter was then advanced midway up the ureter and 10 cc of sterile saline instilled in the left ureter. Barbotage was then performed and 10 cc aspirated and sent to the lab as left ureteral washing for cytology. Omnipaque contrast was injected through the ureteral catheter and a retrograde pyelogram was performed with findings as dictated above. The catheter was then removed from the left ureter and the right ureteral orifice was cannulated and a retrograde pyelogram was performed with the above findings, all normal.  A 0.38 sensor guidewire was then advanced up the left ureter into the renal pelvis under fluoroscopic guidance.  A rigid ureteroscope was then gently passed to the patient's urethra and into the bladder under visual guidance. The left ureteral orifice was then inserted alongside the guidewire and the scope was advanced up to the renal pelvis easily. There were no significant findings such as ureteral wall tumors or lesions. There were no stones. The scope was then backed out slowly under visual guidance to the bladder. Given the normal findings and the atraumatic ureteroscopy the guidewire was removed from the ureter and a decision was made not to place a ureteral stent.  The bladder was then emptied and the procedure ended.  The patient appeared to tolerate the procedure well and without complications.  The patient was able to be awakened and transferred to the recovery unit in satisfactory condition.    Ardis Hughs, M.D.

## 2013-11-11 NOTE — Transfer of Care (Signed)
Immediate Anesthesia Transfer of Care Note  Patient: Brendan Meyer  Procedure(s) Performed: Procedure(s) (LRB): CYSTOSCOPY, BILATERAL RETROGRADE PYELOGRAM LEFT URETEROSCOPY, COLLECTION OF LEFT URETERAL WASHINGS FOR CYTOLOGY (Bilateral)  Patient Location: PACU  Anesthesia Type: General  Level of Consciousness: sedated, patient cooperative and responds to stimulation  Airway & Oxygen Therapy: Patient Spontanous Breathing and Patient connected to face mask oxgen  Post-op Assessment: Report given to PACU RN and Post -op Vital signs reviewed and stable  Post vital signs: Reviewed and stable  Complications: No apparent anesthesia complications

## 2013-11-11 NOTE — Anesthesia Preprocedure Evaluation (Addendum)
Anesthesia Evaluation  Patient identified by MRN, date of birth, ID band Patient awake    Reviewed: Allergy & Precautions, H&P , NPO status , Patient's Chart, lab work & pertinent test results  Airway Mallampati: II TM Distance: >3 FB Neck ROM: Full    Dental  (+) Edentulous Upper, Edentulous Lower, Dental Advisory Given   Pulmonary neg pulmonary ROS, shortness of breath and with exertion,  breath sounds clear to auscultation  Pulmonary exam normal       Cardiovascular + CAD and + CABG + dysrhythmias Atrial Fibrillation Rhythm:Regular Rate:Normal  CABG 2012. Ischemic cardiomyopathy   Neuro/Psych negative neurological ROS  negative psych ROS   GI/Hepatic negative GI ROS, Neg liver ROS, GERD-  Medicated and Controlled,  Endo/Other  diabetes, Well Controlled, Type 2, Insulin Dependent, Oral Hypoglycemic Agents  Renal/GU negative Renal ROS  negative genitourinary   Musculoskeletal negative musculoskeletal ROS (+)   Abdominal   Peds negative pediatric ROS (+)  Hematology negative hematology ROS (+)   Anesthesia Other Findings   Reproductive/Obstetrics negative OB ROS                          Anesthesia Physical Anesthesia Plan  ASA: III  Anesthesia Plan: General   Post-op Pain Management:    Induction: Intravenous  Airway Management Planned: LMA  Additional Equipment:   Intra-op Plan:   Post-operative Plan:   Informed Consent:   Plan Discussed with: Surgeon  Anesthesia Plan Comments:         Anesthesia Quick Evaluation

## 2013-11-11 NOTE — H&P (Signed)
Reason For Visit Follow-up after hematuria protocol CT scan   History of Present Illness This is a 79 year old male referred by Dr. Roque Cash, M.D. for evaluation and management of bilateral nephrolithiasis.    The patient describe low back pain that radiates across his back on both sides. This is made worse with lying down. The pain often goes away when he urinates. The patient also describes intermittent short twinges of pain on the left side just prior to voiding. The patient denies any history of gross hematuria. He denies any dysuria or history of recurrent urinary tract infections. Patient denies any frequency, urgency or incontinence. The patient has a history of kidney stones and had shockwave lithotripsy in the 1980s. The patient has not had any kidney stones since. He did have a KUB that was performed by his primary care doctor which the report reads. There are bilateral stones within the renal shadows. The patient has no other significant GU history including GU surgeries family history of GU malignancies. The patient is a nonsmoker and has few risk factors for transitional cell carcinoma.  Interval: The patient has undergone a hematuria protocol CT scan which showed left-sided hydroureteronephrosis down to the level of the bladder with some thickening of the ureter. The distal 3cm of the left ureter is unfilled. Patient denies any ongoing gross hematuria. The patient has had ongoing pain when he lies down with a full bladder. For this she's been taking a half of a 5 mg oxycodone and Ambien. He also has left flank pain when he voids. This is unchanged.    Patient's comorbidities include coronary artery disease he is status post CABG 6 in 2013. The patient is also diabetic, last A1c was low sevens.   Past Medical History Problems  1. History of diabetes mellitus (V12.29)  Surgical History Problems  1. History of CABG (CABG) 2. History of Hernia Repair  Current Meds 1. Aspirin  81 MG Oral Tablet;  Therapy: (Recorded:14Apr2015) to Recorded 2. Lantus SoloStar 100 UNIT/ML SOLN;  Therapy: (Recorded:14Apr2015) to Recorded 3. MetFORMIN HCl ER 500 MG Oral Tablet Extended Release 24 Hour;  Therapy: (Recorded:14Apr2015) to Recorded 4. Metoprolol Tartrate 25 MG Oral Tablet;  Therapy: (Recorded:14Apr2015) to Recorded 5. Omeprazole 20 MG Oral Capsule Delayed Release;  Therapy: (Recorded:14Apr2015) to Recorded 6. Simvastatin 40 MG Oral Tablet;  Therapy: (Recorded:14Apr2015) to Recorded  Allergies Medication  1. No Known Drug Allergies  Family History Problems  1. Family history of cerebrovascular accident (V17.1) : Mother  Social History Problems  1. Denied: History of Alcohol use 2. Denied: History of Caffeine use 3. Married 4. Mother deceased 79. Never a smoker 6. Number of children   1 son; 1 daughter 16. Retired  Review of Systems No changes to the review of systems unless otherwise noted above   Vitals Vital Signs [Data Includes: Last 1 Day]  Recorded: 28Apr2015 12:12PM  Height: 5 ft 11 in Weight: 158 lb  BMI Calculated: 22.04 BSA Calculated: 1.91 Blood Pressure: 133 / 71 Temperature: 97.4 F Heart Rate: 80  Physical Exam Constitutional: Well nourished and well developed . No acute distress.  ENT:. The ears and nose are normal in appearance.  Neck: The appearance of the neck is normal and no neck mass is present.  Pulmonary: No respiratory distress, normal respiratory rhythm and effort and clear bilateral breath sounds.  Cardiovascular: Heart rate and rhythm are normal . The arterial pulses are normal. No peripheral edema.  Abdomen: The abdomen is soft and nontender. No  masses are palpated. No CVA tenderness. No hernias are palpable. No hepatosplenomegaly noted.  Skin: Normal skin turgor, no visible rash and no visible skin lesions.  Neuro/Psych:. Mood and affect are appropriate.    Results/Data Urine [Data Includes: Last 1 Day]   28Apr2015   COLOR YELLOW   APPEARANCE CLEAR   SPECIFIC GRAVITY 1.030   pH 6.0   GLUCOSE 500 mg/dL  BILIRUBIN NEG   KETONE NEG mg/dL  BLOOD NEG   PROTEIN NEG mg/dL  UROBILINOGEN 0.2 mg/dL  NITRITE NEG   LEUKOCYTE ESTERASE NEG   Selected Results  CT-ABD/PELVIS WITH CONTRAST 24Apr2015 12:00AM Louis Meckel   Test Name Result Flag Reference  CT-ABD/PELVIS WITH CONTRAST (Report)    ** RADIOLOGY REPORT BY [facility] **   CLINICAL DATA: Low back pain. Hydronephrosis variant  EXAM: CT ABDOMEN AND PELVIS WITH CONTRAST  TECHNIQUE: Multidetector CT imaging of the abdomen and pelvis was performed using the standard protocol following bolus administration of intravenous contrast.  CONTRAST: 125 cc Isovue  COMPARISON: CT 10/18/2013  FINDINGS: Renal: Cortical phase imaging demonstrates a nonenhancing cysts within the mid right renal cortex measuring 14 mm. There are multiple foci of cortical scarring within the left kidney. No enhancing renal cortical lesion. There is mild pelvic caliectasis and hydroureter on the left. There is no filling defect within the proximal left ureter. The distal left ureter does not fill over approximately 3 cm segment leading up the vesicoureteral junction. Trace contrast does pass through the distal ureter. There is no filling defect the bladder. The right ureter is normal.  Lung bases are clear. No pericardial fluid. No focal hepatic lesion. The pancreas, spleen, adrenal glands are normal. Stomach, small bowel, appendix, cecum are normal. Multiple diverticular of the sigmoid colon.  Abdominal aorta is normal caliber. No retroperitoneal periportal lymphadenopathy.  No free fluid the pelvis. Prostate gland is normal. No bladder calculi filling defect within the bladder  IMPRESSION: 1. Nonfilling of the distal 3 cm of the distal left ureter without discrete lesion identified. There is mild hydroureter and pelviectasis on the left as well  as renal cortical scarring. Differential for this nonfilling would be debris within the ureter, stricture, or uroepithelial neoplasm. Consider retrograde pyelogram for evaluation. 2. Bosniak 1 renal cyst of the right kidney.   Electronically Signed  By: Suzy Bouchard M.D.  On: 10/28/2013 56:25   BASIC METABOLIC PANEL [id number] 87:68TL Louis Meckel  SPECIMEN TYPE: BLOOD   Test Name Result Flag Reference  GLUCOSE 174 mg/dL H 70-99  BUN 16 mg/dL  6-23  CREATININE 0.99 mg/dL  0.50-1.50  SODIUM 139 mEq/L  135-145  POTASSIUM 4.4 mEq/L  3.5-5.3  CHLORIDE 102 mEq/L  96-112  CO2 28 mEq/L  19-32  CALCIUM 9.7 mg/dL  8.4-10.5  Est GFR, African American 85 mL/min    Est GFR, NonAfrican American 74 mL/min    THE ESTIMATED GFR IS A CALCULATION VALID FOR ADULTS (>=27 YEARS OLD) THAT USES THE CKD-EPI ALGORITHM TO ADJUST FOR AGE AND SEX. IT IS   NOT TO BE USED FOR CHILDREN, PREGNANT WOMEN, HOSPITALIZED PATIENTS,    PATIENTS ON DIALYSIS, OR WITH RAPIDLY CHANGING KIDNEY FUNCTION. ACCORDING TO THE NKDEP, EGFR >89 IS NORMAL, 60-89 SHOWS MILD IMPAIRMENT, 30-59 SHOWS MODERATE IMPAIRMENT, 15-29 SHOWS SEVERE IMPAIRMENT AND <15 IS ESRD.   UA With REFLEX 14Apr2015 02:13PM Louis Meckel  SPECIMEN TYPE: CLEAN CATCH   Test Name Result Flag Reference  COLOR YELLOW  YELLOW  APPEARANCE CLEAR  CLEAR  SPECIFIC GRAVITY  1.020  1.005-1.030  pH 6.0  5.0-8.0  GLUCOSE > 1000 mg/dL A NEG  BILIRUBIN NEG  NEG  KETONE NEG mg/dL  NEG  BLOOD NEG  NEG  PROTEIN NEG mg/dL  NEG  UROBILINOGEN 0.2 mg/dL  0.0-1.0  NITRITE NEG  NEG  LEUKOCYTE ESTERASE NEG  NEG   AU CT-STONE PROTOCOL 14Apr2015 12:00AM Louis Meckel   Test Name Result Flag Reference  CT-STONE PROTOCOL (Report)    ** RADIOLOGY REPORT BY [facility] **   CLINICAL DATA: Low back pain  EXAM: CT ABDOMEN AND PELVIS WITHOUT CONTRAST (URINARY CALCULUS PROTOCOL)  TECHNIQUE: Multidetector CT imaging was performed through the  abdomen and pelvis without intravenous contrast to include the urinary tract.  COMPARISON: 07/20/2009  FINDINGS: Lung bases are unremarkable. Sagittal images of the spine shows mild degenerative changes lumbar spine. Unenhanced liver shows no biliary ductal dilatation. Question tiny gallstone or sludge within gallbladder. The pancreas is atrophic. The spleen and adrenal glands are unremarkable.  No right nephrolithiasis. Nonobstructive calcified calculus in lower pole of the left kidney measures 2 mm. Mild left hydronephrosis and left hydroureter. Mild left perinephric stranding. No calcified ureteral calculi are noted bilaterally.  Findings may be due to a recent passed left ureteral calculus or other left urinary tract inflammation/infection. Clinical correlation is necessary.  Atherosclerotic calcifications of abdominal aorta and iliac arteries. No aortic aneurysm. Normal appendix. No pericecal inflammation. Small umbilical hernia containing fat without evidence of acute complication. Scattered diverticula are noted sigmoid colon. No evidence of acute diverticulitis. Moderate stool within rectum measures 6.2 cm in diameter. Prostate gland measures 5 by 3.5 cm. No calcified calculi are noted within urinary bladder.  IMPRESSION: 1. There is left nonobstructive nephrolithiasis. Left mild hydronephrosis and hydroureter. No calcified ureteral calculi are noted bilaterally. Mild left perinephric stranding. Findings may be due to a recent passed left ureteral calculus or left urinary tract inflammation/infection. Clinical correlation is necessary. 2. Normal appendix. No pericecal inflammation. 3. No calcified calculi are noted within urinary bladder. 4. Mild distended rectum with stool. 5. Atherosclerotic calcifications of abdominal aorta and iliac arteries. 6. Sigmoid colon diverticula. No evidence of acute diverticulitis.   Electronically Signed  By: Lahoma Crocker M.D.  On:  10/18/2013 16:21   Assessment Left-sided hydroureteronephrosis on clinical significance. patient also has a small kidney stone on the right side which is nonobstructing.   Plan Health Maintenance  1. UA With REFLEX; [Do Not Release]; Status:Complete;   Done: [id number] 12:00PM Hydronephrosis, left  2. Start: OxyCODONE HCl - 5 MG Oral Capsule; TAKE 1 TO 2 CAPSULES EVERY 4 HOURS  AS NEEDED FOR BREAKTHROUGH PAIN 3. Follow-up Schedule Surgery Office  Follow-up  Status: Complete  Done: 28Apr2015  Discussion/Summary I recommended that the patient go to the OR for bilateral retrograde pyelograms, left ureteroscopy, selective cytology, and potential ureteral biopsy. This will give [place] the most diagnostic information and allow [place] to determine whether or not this finding is significant. I discussed this procedure with the patient detail and I went over the risks and benefits of the operation in detail. We'll get this scheduled for the patient as soon as it is convenient for him.

## 2013-11-11 NOTE — Anesthesia Postprocedure Evaluation (Signed)
  Anesthesia Post-op Note  Patient: Brendan Meyer  Procedure(s) Performed: Procedure(s) (LRB): CYSTOSCOPY, BILATERAL RETROGRADE PYELOGRAM LEFT URETEROSCOPY, COLLECTION OF LEFT URETERAL WASHINGS FOR CYTOLOGY (Bilateral)  Patient Location: PACU  Anesthesia Type: General  Level of Consciousness: awake and alert   Airway and Oxygen Therapy: Patient Spontanous Breathing  Post-op Pain: mild  Post-op Assessment: Post-op Vital signs reviewed, Patient's Cardiovascular Status Stable, Respiratory Function Stable, Patent Airway and No signs of Nausea or vomiting  Last Vitals:  Filed Vitals:   11/11/13 1404  BP: 154/71  Pulse: 82  Temp:   Resp: 16    Post-op Vital Signs: stable   Complications: No apparent anesthesia complications

## 2013-11-11 NOTE — Interval H&P Note (Signed)
History and Physical Interval Note:  11/11/2013 11:55 AM  Brendan Meyer  has presented today for surgery, with the diagnosis of left hydronephrosis   The various methods of treatment have been discussed with the patient and family. After consideration of risks, benefits and other options for treatment, the patient has consented to  Procedure(s): CYSTOSCOPYBILATERAL RETROGRADE PYELOGRAM LEFT URETEROSCOPY POSSIBLE URETERAL BIOPSY  (Bilateral) HOLMIUM LASER APPLICATION (N/A) as a surgical intervention .  The patient's history has been reviewed, patient examined, no change in status, stable for surgery.  I have reviewed the patient's chart and labs.  Questions were answered to the patient's satisfaction.     Ardis Hughs

## 2013-11-11 NOTE — Discharge Instructions (Signed)
DISCHARGE INSTRUCTIONS FOR KIDNEY STONES OR URETERAL STENT   MEDICATIONS:  1. RESUME YOUR ASPIRIN, or any other medicines like ibuprofen, motrin, excedrin, advil, aleve, vitamin E, fish oil  2. Resume all your other meds from home  ACTIVITY:  1. Strenuous activity as tolerated 2. No driving while on narcotic pain medications  3. Drink plenty of water  4. Continue to walk at home - you can still get blood clots when you are at home, so keep active, but don't over do it.  5. May return to work/school tomorrow or when you feel ready   BATHING:  1. You can shower and we recommend daily showers   SIGNS/SYMPTOMS TO CALL:  Please call us if you have a fever greater than 101.5, uncontrolled nausea/vomiting, uncontrolled pain, dizziness, unable to urinate, bloody urine, chest pain, shortness of breath, leg swelling, leg pain, redness around wound, drainage from wound, or any other concerns or questions.   You can reach [place] at [phone].   FOLLOW-UP:  1. You have an appointment  on Nov 25, 2013.

## 2013-11-12 ENCOUNTER — Encounter (HOSPITAL_COMMUNITY): Payer: Self-pay | Admitting: Emergency Medicine

## 2013-11-12 ENCOUNTER — Emergency Department (HOSPITAL_COMMUNITY)
Admission: EM | Admit: 2013-11-12 | Discharge: 2013-11-12 | Disposition: A | Discharge status: Home / Self Care | Payer: Medicare PPO | Attending: Emergency Medicine | Admitting: Emergency Medicine

## 2013-11-12 DIAGNOSIS — R339 Retention of urine, unspecified: Secondary | ICD-10-CM | POA: Insufficient documentation

## 2013-11-12 DIAGNOSIS — IMO0002 Reserved for concepts with insufficient information to code with codable children: Secondary | ICD-10-CM | POA: Insufficient documentation

## 2013-11-12 DIAGNOSIS — Z87442 Personal history of urinary calculi: Secondary | ICD-10-CM | POA: Insufficient documentation

## 2013-11-12 DIAGNOSIS — Z951 Presence of aortocoronary bypass graft: Secondary | ICD-10-CM | POA: Insufficient documentation

## 2013-11-12 DIAGNOSIS — E785 Hyperlipidemia, unspecified: Secondary | ICD-10-CM | POA: Insufficient documentation

## 2013-11-12 DIAGNOSIS — Z794 Long term (current) use of insulin: Secondary | ICD-10-CM | POA: Insufficient documentation

## 2013-11-12 DIAGNOSIS — Z7982 Long term (current) use of aspirin: Secondary | ICD-10-CM | POA: Insufficient documentation

## 2013-11-12 DIAGNOSIS — R300 Dysuria: Secondary | ICD-10-CM | POA: Insufficient documentation

## 2013-11-12 DIAGNOSIS — I2510 Atherosclerotic heart disease of native coronary artery without angina pectoris: Secondary | ICD-10-CM | POA: Insufficient documentation

## 2013-11-12 DIAGNOSIS — R350 Frequency of micturition: Secondary | ICD-10-CM | POA: Insufficient documentation

## 2013-11-12 DIAGNOSIS — Z8669 Personal history of other diseases of the nervous system and sense organs: Secondary | ICD-10-CM | POA: Insufficient documentation

## 2013-11-12 DIAGNOSIS — Y849 Medical procedure, unspecified as the cause of abnormal reaction of the patient, or of later complication, without mention of misadventure at the time of the procedure: Secondary | ICD-10-CM | POA: Insufficient documentation

## 2013-11-12 DIAGNOSIS — N9989 Other postprocedural complications and disorders of genitourinary system: Secondary | ICD-10-CM | POA: Insufficient documentation

## 2013-11-12 DIAGNOSIS — Z79899 Other long term (current) drug therapy: Secondary | ICD-10-CM | POA: Insufficient documentation

## 2013-11-12 DIAGNOSIS — E119 Type 2 diabetes mellitus without complications: Secondary | ICD-10-CM | POA: Insufficient documentation

## 2013-11-12 DIAGNOSIS — F3289 Other specified depressive episodes: Secondary | ICD-10-CM | POA: Insufficient documentation

## 2013-11-12 DIAGNOSIS — R319 Hematuria, unspecified: Secondary | ICD-10-CM | POA: Insufficient documentation

## 2013-11-12 DIAGNOSIS — F329 Major depressive disorder, single episode, unspecified: Secondary | ICD-10-CM | POA: Insufficient documentation

## 2013-11-12 LAB — URINE MICROSCOPIC-ADD ON

## 2013-11-12 LAB — URINALYSIS, ROUTINE W REFLEX MICROSCOPIC
Bilirubin Urine: NEGATIVE
KETONES UR: 15 mg/dL — AB
Nitrite: NEGATIVE
PH: 5.5 (ref 5.0–8.0)
Protein, ur: 100 mg/dL — AB
Specific Gravity, Urine: 1.017 (ref 1.005–1.030)
Urobilinogen, UA: 0.2 mg/dL (ref 0.0–1.0)

## 2013-11-12 NOTE — Discharge Instructions (Signed)
Hematuria, Adult Hematuria is blood in your urine. It can be caused by a bladder infection, kidney infection, prostate infection, kidney stone, or cancer of your urinary tract. Infections can usually be treated with medicine, and a kidney stone usually will pass through your urine. If neither of these is the cause of your hematuria, further workup to find out the reason may be needed. It is very important that you tell your health care provider about any blood you see in your urine, even if the blood stops without treatment or happens without causing pain. Blood in your urine that happens and then stops and then happens again can be a symptom of a very serious condition. Also, pain is not a symptom in the initial stages of many urinary cancers. HOME CARE INSTRUCTIONS   Drink lots of fluid, 3 4 quarts a day. If you have been diagnosed with an infection, cranberry juice is especially recommended, in addition to large amounts of water.  Avoid caffeine, tea, and carbonated beverages, because they tend to irritate the bladder.  Avoid alcohol because it may irritate the prostate.  Only take over-the-counter or prescription medicines for pain, discomfort, or fever as directed by your health care provider.  If you have been diagnosed with a kidney stone, follow your health care provider's instructions regarding straining your urine to catch the stone.  Empty your bladder often. Avoid holding urine for long periods of time.  After a bowel movement, women should cleanse front to back. Use each tissue only once.  Empty your bladder before and after sexual intercourse if you are a male. SEEK MEDICAL CARE IF: You develop back pain, fever, a feeling of sickness in your stomach (nausea), or vomiting or if your symptoms are not better in 3 days. Return sooner if you are getting worse. SEEK IMMEDIATE MEDICAL CARE IF:   You have a persistent fever, with a temperature of 101.45F (38.8C) or greater.  You  develop severe vomiting and are unable to keep the medicine down.  You develop severe back or abdominal pain despite taking your medicines.  You begin passing a large amount of blood or clots in your urine.  You feel extremely weak or faint, or you pass out. MAKE SURE YOU:   Understand these instructions.  Will watch your condition.  Will get help right away if you are not doing well or get worse. Document Released: 06/23/2005 Document Revised: 04/13/2013 Document Reviewed: 02/21/2013 [facility] Patient Information 2014 [place].  Acute Urinary Retention, Male Acute urinary retention is the temporary inability to urinate. This is a common problem in older men. As men age their prostates become larger and block the flow of urine from the bladder. This is usually a problem that has come on gradually.  HOME CARE INSTRUCTIONS If you are sent home with a Foley catheter and a drainage system, you will need to discuss the best course of action with your health care provider. While the catheter is in, maintain a good intake of fluids. Keep the drainage bag emptied and lower than your catheter. This is so that contaminated urine will not flow back into your bladder, which could lead to a urinary tract infection. There are two main types of drainage bags. One is a large bag that usually is used at night. It has a good capacity that will allow you to sleep through the night without having to empty it. The second type is called a leg bag. It has a smaller capacity, so it  needs to be emptied more frequently. However, the main advantage is that it can be attached by a leg strap and can go underneath your clothing, allowing you the freedom to move about or leave your home. Only take over-the-counter or prescription medicines for pain, discomfort, or fever as directed by your health care provider.  SEEK MEDICAL CARE IF:  You develop a low-grade fever.  You experience spasms or leakage of urine with  the spasms. SEEK IMMEDIATE MEDICAL CARE IF:   You develop chills or fever.  Your catheter stops draining urine.  Your catheter falls out.  You start to develop increased bleeding that does not respond to rest and increased fluid intake. MAKE SURE YOU:  Understand these instructions.  Will watch your condition.  Will get help right away if you are not doing well or get worse. Document Released: 09/29/2000 Document Revised: 02/23/2013 Document Reviewed: 12/02/2012 [facility] Patient Information 2014 [place], [place].

## 2013-11-12 NOTE — ED Provider Notes (Addendum)
CSN: [id number]     Arrival date & time 11/12/13  9381 History   First MD Initiated Contact with Patient 11/12/13 [phone]     Chief Complaint  Patient presents with  . Urinary Retention     (Consider location/radiation/quality/duration/timing/severity/associated sxs/prior Treatment) Patient is a 79 y.o. male presenting with male genitourinary complaint and hematuria. The history is provided by the patient.  Male GU Problem Presenting symptoms: dysuria   Context: during urination   Relieved by:  Nothing Worsened by:  Nothing tried Ineffective treatments:  None tried Associated symptoms: hematuria and urinary frequency   Associated symptoms: no abdominal pain, no diarrhea, no fever, no nausea and no vomiting   Risk factors comment:  Cystoscopy yesterday Hematuria This is a new problem. The current episode started yesterday. The problem occurs constantly. The problem has not changed since onset.Pertinent negatives include no chest pain, no abdominal pain, no headaches and no shortness of breath. Nothing aggravates the symptoms. Nothing relieves the symptoms. He has tried nothing for the symptoms. The treatment provided no relief.    Past Medical History  Diagnosis Date  . Neuropathy   . Hyperlipidemia   . Depression   . Bell's palsy   . Kidney stones   . Diabetes mellitus   . Coronary artery disease     s/p CABG   Past Surgical History  Procedure Laterality Date  . Tonsillectomy    . Hernia repair      double  . Eardrum    . Lithotripsy    . Circumcision    . Coronary artery bypass graft  02/2011    x 6 vessel  . Cystoscopy     Family History  Problem Relation Age of Onset  . Breast cancer Maternal Aunt    History  Substance Use Topics  . Smoking status: Never Smoker   . Smokeless tobacco: Not on file  . Alcohol Use: Yes     Comment: rarely    Review of Systems  Constitutional: Negative for fever.  HENT: Negative for drooling and rhinorrhea.   Eyes: Negative for  pain.  Respiratory: Negative for cough and shortness of breath.   Cardiovascular: Negative for chest pain and leg swelling.  Gastrointestinal: Negative for nausea, vomiting, abdominal pain and diarrhea.  Genitourinary: Positive for dysuria, frequency and hematuria.  Musculoskeletal: Negative for gait problem and neck pain.  Skin: Negative for color change.  Neurological: Negative for numbness and headaches.  Hematological: Negative for adenopathy.  Psychiatric/Behavioral: Negative for behavioral problems.  All other systems reviewed and are negative.     Allergies  Review of patient's allergies indicates no known allergies.  Home Medications   Prior to Admission medications   Medication Sig Start Date End Date Taking? Authorizing Provider  aspirin EC 81 MG tablet Take 81 mg by mouth daily.   Yes Historical Provider, MD  Cholecalciferol (VITAMIN D) 2000 UNITS CAPS Take 2,000 Units by mouth daily.   Yes Historical Provider, MD  Insulin Glargine (LANTUS SOLOSTAR) 100 UNIT/ML Solostar Pen Inject 40 Units into the skin every evening.   Yes Historical Provider, MD  metFORMIN (GLUCOPHAGE) 500 MG tablet Take 500 mg by mouth 2 (two) times daily with a meal.     Yes Historical Provider, MD  metoprolol tartrate (LOPRESSOR) 25 MG tablet Take 25 mg by mouth at bedtime.  02/12/11  Yes Jolaine Artist, MD  omeprazole (PRILOSEC) 20 MG capsule Take 20 mg by mouth every other day.    Yes Historical Provider,  MD  simvastatin (ZOCOR) 40 MG tablet Take 40 mg by mouth at bedtime.     Yes Historical Provider, MD  tamsulosin (FLOMAX) 0.4 MG CAPS capsule Take 1 capsule (0.4 mg total) by mouth daily. 11/11/13  Yes Ardis Hughs, MD  zolpidem (AMBIEN) 10 MG tablet Take 5 mg by mouth at bedtime as needed for sleep.   Yes Historical Provider, MD   BP 146/69  Pulse 94  Temp(Src) 98.1 F (36.7 C) (Oral)  Resp 16  SpO2 99% Physical Exam  Nursing note and vitals reviewed. Constitutional: He is oriented to  person, place, and time. He appears well-developed and well-nourished.  HENT:  Head: Normocephalic and atraumatic.  Right Ear: External ear normal.  Left Ear: External ear normal.  Nose: Nose normal.  Mouth/Throat: Oropharynx is clear and moist. No oropharyngeal exudate.  Eyes: Conjunctivae and EOM are normal. Pupils are equal, round, and reactive to light.  Neck: Normal range of motion. Neck supple.  Cardiovascular: Normal rate, regular rhythm, normal heart sounds and intact distal pulses.  Exam reveals no gallop and no friction rub.   No murmur heard. Pulmonary/Chest: Effort normal and breath sounds normal. No respiratory distress. He has no wheezes.  Abdominal: Soft. Bowel sounds are normal. He exhibits no distension. There is no tenderness. There is no rebound and no guarding.  Musculoskeletal: Normal range of motion. He exhibits no edema and no tenderness.  Neurological: He is alert and oriented to person, place, and time.  Skin: Skin is warm and dry.  Psychiatric: He has a normal mood and affect. His behavior is normal.    ED Course  Procedures (including critical care time) Labs Review Labs Reviewed  URINALYSIS, ROUTINE W REFLEX MICROSCOPIC - Abnormal; Notable for the following:    Color, Urine RED (*)    APPearance CLOUDY (*)    Glucose, UA >1000 (*)    Hgb urine dipstick LARGE (*)    Ketones, ur 15 (*)    Protein, ur 100 (*)    Leukocytes, UA SMALL (*)    All other components within normal limits  URINE CULTURE  URINE MICROSCOPIC-ADD ON    Imaging Review No results found.   EKG Interpretation None      MDM   Final diagnoses:  Hematuria  Urinary retention    10:05 AM 79 y.o. male status post cystoscopy yesterday who presents with frequency and suprapubic pressure. He states that he has been attempting to void every 15 minutes overnight with just small dribbles of pinkish urine. He denies any fevers or abdominal pain. He states that he had urinary retention  with a previous cystoscopy. Will place foley catheter and get a urinalysis. Procedure report from yesterday reviewed by me.   12:01 PM: Approx 700cc out of foley per nursing. Initially dark appearing urine now clearing and pinkish. UA w/ lg blood. Patient comfortable on exam and currently asymptomatic. I have discussed the diagnosis/risks/treatment options with the patient and family and believe the pt to be eligible for discharge home to follow-up with Dr. Louis Meckel on monday. We also discussed returning to the ED immediately if new or worsening sx occur. We discussed the sx which are most concerning (e.g., worsening hematuria, abdominal pain, fever) that necessitate immediate return. Medications administered to the patient during their visit and any new prescriptions provided to the patient are listed below.  Medications given during this visit Medications - No data to display  New Prescriptions   No medications on file  Blanchard Kelch, MD 11/12/13 [place], MD 11/12/13 [phone]

## 2013-11-12 NOTE — ED Notes (Signed)
Patient voided prior to catheter placement about 100cc. Bladder scan showed >300cc in bladder. After catheter placement about 750cc has drained. [facility] notified.

## 2013-11-12 NOTE — ED Notes (Signed)
He states he had a cystoscopy yesterday--today is unable to urinate. He is uncomfortable in appearance, but in no distress.

## 2013-11-13 LAB — URINE CULTURE
CULTURE: NO GROWTH
Colony Count: NO GROWTH

## 2013-11-14 ENCOUNTER — Encounter (HOSPITAL_COMMUNITY): Payer: Self-pay | Admitting: Urology

## 2014-08-28 ENCOUNTER — Encounter: Payer: Self-pay | Admitting: Internal Medicine

## 2014-09-27 DIAGNOSIS — N08 Glomerular disorders in diseases classified elsewhere: Secondary | ICD-10-CM | POA: Diagnosis not present

## 2014-09-27 DIAGNOSIS — E559 Vitamin D deficiency, unspecified: Secondary | ICD-10-CM | POA: Diagnosis not present

## 2014-09-27 DIAGNOSIS — E1142 Type 2 diabetes mellitus with diabetic polyneuropathy: Secondary | ICD-10-CM | POA: Diagnosis not present

## 2014-09-27 DIAGNOSIS — D649 Anemia, unspecified: Secondary | ICD-10-CM | POA: Diagnosis not present

## 2014-09-27 DIAGNOSIS — E1129 Type 2 diabetes mellitus with other diabetic kidney complication: Secondary | ICD-10-CM | POA: Diagnosis not present

## 2014-09-27 DIAGNOSIS — E1149 Type 2 diabetes mellitus with other diabetic neurological complication: Secondary | ICD-10-CM | POA: Diagnosis not present

## 2014-09-27 DIAGNOSIS — Z1389 Encounter for screening for other disorder: Secondary | ICD-10-CM | POA: Diagnosis not present

## 2014-09-27 DIAGNOSIS — N200 Calculus of kidney: Secondary | ICD-10-CM | POA: Diagnosis not present

## 2014-10-04 DIAGNOSIS — N1330 Unspecified hydronephrosis: Secondary | ICD-10-CM | POA: Diagnosis not present

## 2014-10-25 DIAGNOSIS — N1330 Unspecified hydronephrosis: Secondary | ICD-10-CM | POA: Diagnosis not present

## 2014-10-25 DIAGNOSIS — R339 Retention of urine, unspecified: Secondary | ICD-10-CM | POA: Diagnosis not present

## 2014-10-25 DIAGNOSIS — N1370 Vesicoureteral-reflux, unspecified: Secondary | ICD-10-CM | POA: Diagnosis not present

## 2014-12-06 DIAGNOSIS — D649 Anemia, unspecified: Secondary | ICD-10-CM | POA: Diagnosis not present

## 2014-12-06 DIAGNOSIS — F418 Other specified anxiety disorders: Secondary | ICD-10-CM | POA: Diagnosis not present

## 2014-12-06 DIAGNOSIS — I2510 Atherosclerotic heart disease of native coronary artery without angina pectoris: Secondary | ICD-10-CM | POA: Diagnosis not present

## 2014-12-06 DIAGNOSIS — I4891 Unspecified atrial fibrillation: Secondary | ICD-10-CM | POA: Diagnosis not present

## 2014-12-06 DIAGNOSIS — E1129 Type 2 diabetes mellitus with other diabetic kidney complication: Secondary | ICD-10-CM | POA: Diagnosis not present

## 2014-12-06 DIAGNOSIS — N401 Enlarged prostate with lower urinary tract symptoms: Secondary | ICD-10-CM | POA: Diagnosis not present

## 2014-12-06 DIAGNOSIS — N1330 Unspecified hydronephrosis: Secondary | ICD-10-CM | POA: Diagnosis not present

## 2014-12-06 DIAGNOSIS — N08 Glomerular disorders in diseases classified elsewhere: Secondary | ICD-10-CM | POA: Diagnosis not present

## 2015-04-17 DIAGNOSIS — N08 Glomerular disorders in diseases classified elsewhere: Secondary | ICD-10-CM | POA: Diagnosis not present

## 2015-04-17 DIAGNOSIS — N1330 Unspecified hydronephrosis: Secondary | ICD-10-CM | POA: Diagnosis not present

## 2015-04-17 DIAGNOSIS — E559 Vitamin D deficiency, unspecified: Secondary | ICD-10-CM | POA: Diagnosis not present

## 2015-04-17 DIAGNOSIS — I4891 Unspecified atrial fibrillation: Secondary | ICD-10-CM | POA: Diagnosis not present

## 2015-04-17 DIAGNOSIS — E1149 Type 2 diabetes mellitus with other diabetic neurological complication: Secondary | ICD-10-CM | POA: Diagnosis not present

## 2015-04-17 DIAGNOSIS — E1129 Type 2 diabetes mellitus with other diabetic kidney complication: Secondary | ICD-10-CM | POA: Diagnosis not present

## 2015-04-17 DIAGNOSIS — D649 Anemia, unspecified: Secondary | ICD-10-CM | POA: Diagnosis not present

## 2015-04-17 DIAGNOSIS — E1142 Type 2 diabetes mellitus with diabetic polyneuropathy: Secondary | ICD-10-CM | POA: Diagnosis not present

## 2015-05-07 ENCOUNTER — Encounter: Payer: Self-pay | Admitting: Internal Medicine

## 2015-08-21 DIAGNOSIS — N200 Calculus of kidney: Secondary | ICD-10-CM | POA: Diagnosis not present

## 2015-08-21 DIAGNOSIS — N1330 Unspecified hydronephrosis: Secondary | ICD-10-CM | POA: Diagnosis not present

## 2015-08-21 DIAGNOSIS — I2510 Atherosclerotic heart disease of native coronary artery without angina pectoris: Secondary | ICD-10-CM | POA: Diagnosis not present

## 2015-08-21 DIAGNOSIS — N08 Glomerular disorders in diseases classified elsewhere: Secondary | ICD-10-CM | POA: Diagnosis not present

## 2015-08-21 DIAGNOSIS — I4891 Unspecified atrial fibrillation: Secondary | ICD-10-CM | POA: Diagnosis not present

## 2015-08-21 DIAGNOSIS — E784 Other hyperlipidemia: Secondary | ICD-10-CM | POA: Diagnosis not present

## 2015-08-21 DIAGNOSIS — E1142 Type 2 diabetes mellitus with diabetic polyneuropathy: Secondary | ICD-10-CM | POA: Diagnosis not present

## 2015-08-21 DIAGNOSIS — D6489 Other specified anemias: Secondary | ICD-10-CM | POA: Diagnosis not present

## 2015-08-21 DIAGNOSIS — E1149 Type 2 diabetes mellitus with other diabetic neurological complication: Secondary | ICD-10-CM | POA: Diagnosis not present

## 2015-12-25 DIAGNOSIS — N1339 Other hydronephrosis: Secondary | ICD-10-CM | POA: Diagnosis not present

## 2015-12-25 DIAGNOSIS — E1149 Type 2 diabetes mellitus with other diabetic neurological complication: Secondary | ICD-10-CM | POA: Diagnosis not present

## 2015-12-25 DIAGNOSIS — N401 Enlarged prostate with lower urinary tract symptoms: Secondary | ICD-10-CM | POA: Diagnosis not present

## 2015-12-25 DIAGNOSIS — I4891 Unspecified atrial fibrillation: Secondary | ICD-10-CM | POA: Diagnosis not present

## 2015-12-25 DIAGNOSIS — M5416 Radiculopathy, lumbar region: Secondary | ICD-10-CM | POA: Diagnosis not present

## 2015-12-25 DIAGNOSIS — E784 Other hyperlipidemia: Secondary | ICD-10-CM | POA: Diagnosis not present

## 2015-12-25 DIAGNOSIS — D6489 Other specified anemias: Secondary | ICD-10-CM | POA: Diagnosis not present

## 2015-12-25 DIAGNOSIS — N200 Calculus of kidney: Secondary | ICD-10-CM | POA: Diagnosis not present

## 2015-12-25 DIAGNOSIS — E559 Vitamin D deficiency, unspecified: Secondary | ICD-10-CM | POA: Diagnosis not present

## 2016-02-26 DIAGNOSIS — Z85828 Personal history of other malignant neoplasm of skin: Secondary | ICD-10-CM | POA: Diagnosis not present

## 2016-02-26 DIAGNOSIS — L570 Actinic keratosis: Secondary | ICD-10-CM | POA: Diagnosis not present

## 2016-02-26 DIAGNOSIS — Z08 Encounter for follow-up examination after completed treatment for malignant neoplasm: Secondary | ICD-10-CM | POA: Diagnosis not present

## 2016-03-25 DIAGNOSIS — Z6822 Body mass index (BMI) 22.0-22.9, adult: Secondary | ICD-10-CM | POA: Diagnosis not present

## 2016-03-25 DIAGNOSIS — R21 Rash and other nonspecific skin eruption: Secondary | ICD-10-CM | POA: Diagnosis not present

## 2016-03-25 DIAGNOSIS — E119 Type 2 diabetes mellitus without complications: Secondary | ICD-10-CM | POA: Diagnosis not present

## 2016-05-07 DIAGNOSIS — E1129 Type 2 diabetes mellitus with other diabetic kidney complication: Secondary | ICD-10-CM | POA: Diagnosis not present

## 2016-05-07 DIAGNOSIS — F418 Other specified anxiety disorders: Secondary | ICD-10-CM | POA: Diagnosis not present

## 2016-05-07 DIAGNOSIS — I4891 Unspecified atrial fibrillation: Secondary | ICD-10-CM | POA: Diagnosis not present

## 2016-05-07 DIAGNOSIS — E559 Vitamin D deficiency, unspecified: Secondary | ICD-10-CM | POA: Diagnosis not present

## 2016-05-07 DIAGNOSIS — N200 Calculus of kidney: Secondary | ICD-10-CM | POA: Diagnosis not present

## 2016-05-07 DIAGNOSIS — E1149 Type 2 diabetes mellitus with other diabetic neurological complication: Secondary | ICD-10-CM | POA: Diagnosis not present

## 2016-05-07 DIAGNOSIS — E1142 Type 2 diabetes mellitus with diabetic polyneuropathy: Secondary | ICD-10-CM | POA: Diagnosis not present

## 2016-05-07 DIAGNOSIS — N08 Glomerular disorders in diseases classified elsewhere: Secondary | ICD-10-CM | POA: Diagnosis not present

## 2016-05-07 DIAGNOSIS — M5416 Radiculopathy, lumbar region: Secondary | ICD-10-CM | POA: Diagnosis not present

## 2016-07-07 DIAGNOSIS — C20 Malignant neoplasm of rectum: Secondary | ICD-10-CM

## 2016-07-07 DIAGNOSIS — C801 Malignant (primary) neoplasm, unspecified: Secondary | ICD-10-CM

## 2016-07-07 HISTORY — DX: Malignant neoplasm of rectum: C20

## 2016-07-07 HISTORY — PX: POLYPECTOMY: SHX149

## 2016-07-07 HISTORY — DX: Malignant (primary) neoplasm, unspecified: C80.1

## 2016-07-07 HISTORY — PX: EYE SURGERY: SHX253

## 2016-08-18 DIAGNOSIS — H269 Unspecified cataract: Secondary | ICD-10-CM

## 2016-08-18 HISTORY — DX: Unspecified cataract: H26.9

## 2016-08-20 DIAGNOSIS — H40011 Open angle with borderline findings, low risk, right eye: Secondary | ICD-10-CM | POA: Diagnosis not present

## 2016-09-12 DIAGNOSIS — E119 Type 2 diabetes mellitus without complications: Secondary | ICD-10-CM | POA: Diagnosis not present

## 2016-09-12 DIAGNOSIS — H01022 Squamous blepharitis right lower eyelid: Secondary | ICD-10-CM | POA: Diagnosis not present

## 2016-09-12 DIAGNOSIS — H401122 Primary open-angle glaucoma, left eye, moderate stage: Secondary | ICD-10-CM | POA: Diagnosis not present

## 2016-09-12 DIAGNOSIS — H01024 Squamous blepharitis left upper eyelid: Secondary | ICD-10-CM | POA: Diagnosis not present

## 2016-09-12 DIAGNOSIS — H2513 Age-related nuclear cataract, bilateral: Secondary | ICD-10-CM | POA: Diagnosis not present

## 2016-09-12 DIAGNOSIS — H11823 Conjunctivochalasis, bilateral: Secondary | ICD-10-CM | POA: Diagnosis not present

## 2016-09-12 DIAGNOSIS — H01021 Squamous blepharitis right upper eyelid: Secondary | ICD-10-CM | POA: Diagnosis not present

## 2016-09-12 DIAGNOSIS — H401111 Primary open-angle glaucoma, right eye, mild stage: Secondary | ICD-10-CM | POA: Diagnosis not present

## 2016-09-12 DIAGNOSIS — H10413 Chronic giant papillary conjunctivitis, bilateral: Secondary | ICD-10-CM | POA: Diagnosis not present

## 2016-09-22 DIAGNOSIS — H2512 Age-related nuclear cataract, left eye: Secondary | ICD-10-CM | POA: Diagnosis not present

## 2016-09-25 DIAGNOSIS — H2512 Age-related nuclear cataract, left eye: Secondary | ICD-10-CM | POA: Diagnosis not present

## 2016-09-25 DIAGNOSIS — H401122 Primary open-angle glaucoma, left eye, moderate stage: Secondary | ICD-10-CM | POA: Diagnosis not present

## 2016-10-01 DIAGNOSIS — I4891 Unspecified atrial fibrillation: Secondary | ICD-10-CM | POA: Diagnosis not present

## 2016-10-01 DIAGNOSIS — E784 Other hyperlipidemia: Secondary | ICD-10-CM | POA: Diagnosis not present

## 2016-10-01 DIAGNOSIS — N08 Glomerular disorders in diseases classified elsewhere: Secondary | ICD-10-CM | POA: Diagnosis not present

## 2016-10-01 DIAGNOSIS — N401 Enlarged prostate with lower urinary tract symptoms: Secondary | ICD-10-CM | POA: Diagnosis not present

## 2016-10-01 DIAGNOSIS — E1149 Type 2 diabetes mellitus with other diabetic neurological complication: Secondary | ICD-10-CM | POA: Diagnosis not present

## 2016-10-01 DIAGNOSIS — E559 Vitamin D deficiency, unspecified: Secondary | ICD-10-CM | POA: Diagnosis not present

## 2016-10-01 DIAGNOSIS — N200 Calculus of kidney: Secondary | ICD-10-CM | POA: Diagnosis not present

## 2016-10-01 DIAGNOSIS — I2510 Atherosclerotic heart disease of native coronary artery without angina pectoris: Secondary | ICD-10-CM | POA: Diagnosis not present

## 2016-10-01 DIAGNOSIS — D649 Anemia, unspecified: Secondary | ICD-10-CM | POA: Diagnosis not present

## 2016-10-01 DIAGNOSIS — E1142 Type 2 diabetes mellitus with diabetic polyneuropathy: Secondary | ICD-10-CM | POA: Diagnosis not present

## 2016-10-01 DIAGNOSIS — Z1389 Encounter for screening for other disorder: Secondary | ICD-10-CM | POA: Diagnosis not present

## 2016-10-08 ENCOUNTER — Encounter (INDEPENDENT_AMBULATORY_CARE_PROVIDER_SITE_OTHER): Payer: Self-pay

## 2016-10-08 ENCOUNTER — Encounter: Payer: Self-pay | Admitting: Internal Medicine

## 2016-10-08 ENCOUNTER — Ambulatory Visit (INDEPENDENT_AMBULATORY_CARE_PROVIDER_SITE_OTHER): Payer: Medicare HMO | Admitting: Internal Medicine

## 2016-10-08 VITALS — BP 136/68 | HR 76 | Resp 18 | Ht 71.0 in | Wt 165.0 lb

## 2016-10-08 DIAGNOSIS — Z86010 Personal history of colonic polyps: Secondary | ICD-10-CM | POA: Diagnosis not present

## 2016-10-08 DIAGNOSIS — R194 Change in bowel habit: Secondary | ICD-10-CM | POA: Diagnosis not present

## 2016-10-08 DIAGNOSIS — K625 Hemorrhage of anus and rectum: Secondary | ICD-10-CM | POA: Diagnosis not present

## 2016-10-08 DIAGNOSIS — R140 Abdominal distension (gaseous): Secondary | ICD-10-CM | POA: Diagnosis not present

## 2016-10-08 MED ORDER — NA SULFATE-K SULFATE-MG SULF 17.5-3.13-1.6 GM/177ML PO SOLN: 1.0000 | Freq: Once | ORAL | 0 refills | Status: AC

## 2016-10-08 NOTE — Progress Notes (Signed)
HISTORY OF PRESENT ILLNESS:  Brendan Meyer is a 82 y.o. male, retired from [employer], with multiple significant medical problems including coronary artery disease and insulin requiring diabetes mellitus. He is sent today by his primary care provider Dr. Forde Dandy with a chief complaint of rectal bleeding, change in bowel habits, and increased intestinal gas. He was last seen in this office February 2011 for follow-up regarding GERD and colon polyps. See that dictation. His last upper endoscopy was performed January 2011. He was found to have reflux esophagitis. Was placed on PPI with resolution of symptoms. He now takes PPI sporadically. He denies significant GERD symptoms or recurrent nausea. No dysphagia. His last colonoscopy was January 2011. In addition to diverticulosis E was found to have diminutive colon polyps which were tubular adenomas. Follow-up in 5 years recommended. He has not. He is accompanied today by his wife. He reports a several month history or longer of hard firm bowel movements. Also he notices red blood on the tissue with wiping. Significant increased intestinal gas as manifested by foul-smelling flatus. He tells me that he has tried multiple over-the-counter agents and probiotics without improvement. Some bloating. She tells me that he recently underwent his annual comprehensive evaluation with Dr. Forde Dandy. He tells me that his blood work was comprehensive and unremarkable. He takes insulin at night and metformin twice daily for his diabetes.  REVIEW OF SYSTEMS:  All non-GI ROS negative except for visual change  Past Medical History:  Diagnosis Date  . Bell's palsy   . Coronary artery disease    s/p CABG  . Depression   . Diabetes mellitus   . Hyperlipidemia   . Kidney stones   . Neuropathy [facility])     Past Surgical History:  Procedure Laterality Date  . CIRCUMCISION    . CORONARY ARTERY BYPASS GRAFT  02/2011   x 6 vessel  . CYSTOSCOPY    .  CYSTOSCOPY/RETROGRADE/URETEROSCOPY Bilateral 11/11/2013   Procedure: CYSTOSCOPY, BILATERAL RETROGRADE PYELOGRAM LEFT URETEROSCOPY, COLLECTION OF LEFT URETERAL WASHINGS FOR CYTOLOGY;  Surgeon: Ardis Hughs, MD;  Location: [facility];  Service: Urology;  Laterality: Bilateral;  . eardrum    . HERNIA REPAIR     double  . LITHOTRIPSY    . TONSILLECTOMY      Social History [name]  reports that he has never smoked. He has never used smokeless tobacco. He reports that he drinks alcohol. He reports that he does not use drugs.  family history includes Breast cancer in his maternal aunt.  No Known Allergies     PHYSICAL EXAMINATION: Vital signs: BP 136/68   Pulse 76   Resp 18   Ht 5\' 11"  (1.803 m)   Wt 165 lb (74.8 kg)   BMI 23.01 kg/m   Constitutional: generally well-appearing, no acute distress Psychiatric: alert and oriented x3, cooperative Eyes: extraocular movements intact, anicteric, conjunctiva pink Mouth: oral pharynx moist, no lesions Neck: supple no lymphadenopathy Cardiovascular: heart regular rate and rhythm, no murmur Lungs: clear to auscultation bilaterally Abdomen: soft, nontender, nondistended, no obvious ascites, no peritoneal signs, normal bowel sounds, no organomegaly Rectal:Deferred until colonoscopy Extremities: no clubbing cyanosis or lower extremity edema bilaterally Skin: no lesions on visible extremities Neuro: No focal deficits. Normal DTRs. Cranial nerves intact. No asterixis.   ASSESSMENT:  #1. Change in bowel habits. Tendency toward constipation #2. Foul-smelling flatus #3. History of adenomatous colon polyps. Overdue for surveillance #4. GERD with history of esophagitis. Problem controlled with on demand use of PPI #  5. Multiple medical problems including coronary artery disease and insulin requiring diabetes.   PLAN:  #1. Recommend Metamucil 2 tablespoons daily #2. Discussion on intestinal gas. Literature on intestinal gas as well  as anti-gas and flatulence dietary sheet provided for his review #3. Schedule colonoscopy to evaluate rectal bleeding and provide colorectal neoplasia surveillance. The patient is high-risk given his age, comorbidities, and the need to address his diabetic medications. I have recommended he decrease his evening dose of insulin from 52 units to 40 units. Also, hold morning dose of metformin.The nature of the procedure, as well as the risks, benefits, and alternatives were carefully and thoroughly reviewed with the patient. Ample time for discussion and questions allowed. The patient understood, was satisfied, and agreed to proceed.  A copy of this consultation note has been sent to Dr. Forde Dandy

## 2016-10-08 NOTE — Patient Instructions (Signed)
You have been scheduled for a colonoscopy. Please follow written instructions given to you at your visit today.  Please pick up your prep supplies at the pharmacy within the next 1-3 days. If you use inhalers (even only as needed), please bring them with you on the day of your procedure. Your physician has requested that you go to www.startemmi.com and enter the access code given to you at your visit today. This web site gives a general overview about your procedure. However, you should still follow specific instructions given to you by our office regarding your preparation for the procedure.   You may take 2 tablespoons of Metamucil daily

## 2016-11-17 ENCOUNTER — Encounter: Payer: Self-pay | Admitting: Internal Medicine

## 2016-11-24 ENCOUNTER — Encounter: Payer: Self-pay | Admitting: Internal Medicine

## 2016-11-24 ENCOUNTER — Ambulatory Visit (AMBULATORY_SURGERY_CENTER): Payer: Medicare HMO | Admitting: Internal Medicine

## 2016-11-24 VITALS — BP 124/70 | HR 68 | Temp 97.3°F | Resp 15 | Ht 71.0 in | Wt 165.0 lb

## 2016-11-24 DIAGNOSIS — K625 Hemorrhage of anus and rectum: Secondary | ICD-10-CM | POA: Diagnosis present

## 2016-11-24 DIAGNOSIS — I10 Essential (primary) hypertension: Secondary | ICD-10-CM | POA: Diagnosis not present

## 2016-11-24 DIAGNOSIS — Z86010 Personal history of colonic polyps: Secondary | ICD-10-CM

## 2016-11-24 DIAGNOSIS — C20 Malignant neoplasm of rectum: Secondary | ICD-10-CM | POA: Diagnosis not present

## 2016-11-24 DIAGNOSIS — K219 Gastro-esophageal reflux disease without esophagitis: Secondary | ICD-10-CM | POA: Diagnosis not present

## 2016-11-24 DIAGNOSIS — I2510 Atherosclerotic heart disease of native coronary artery without angina pectoris: Secondary | ICD-10-CM | POA: Diagnosis not present

## 2016-11-24 DIAGNOSIS — D124 Benign neoplasm of descending colon: Secondary | ICD-10-CM | POA: Diagnosis not present

## 2016-11-24 DIAGNOSIS — E119 Type 2 diabetes mellitus without complications: Secondary | ICD-10-CM | POA: Diagnosis not present

## 2016-11-24 DIAGNOSIS — D125 Benign neoplasm of sigmoid colon: Secondary | ICD-10-CM | POA: Diagnosis not present

## 2016-11-24 DIAGNOSIS — K6289 Other specified diseases of anus and rectum: Secondary | ICD-10-CM

## 2016-11-24 DIAGNOSIS — D128 Benign neoplasm of rectum: Secondary | ICD-10-CM

## 2016-11-24 DIAGNOSIS — Z951 Presence of aortocoronary bypass graft: Secondary | ICD-10-CM | POA: Diagnosis not present

## 2016-11-24 MED ORDER — SODIUM CHLORIDE 0.9 % IV SOLN: 500.0000 mL | INTRAVENOUS | Status: DC

## 2016-11-24 NOTE — Progress Notes (Signed)
Report to PACU, RN, vss, BBS= Clear.  

## 2016-11-24 NOTE — Op Note (Signed)
[facility] Patient Name: Brendan Meyer Procedure Date: 11/24/2016 8:26 AM MRN: [id number] Endoscopist: Docia Chuck. Henrene Pastor , MD Age: 82 Referring MD:  Date of Birth: [birth year] Gender: Male Account #: [id number] Procedure:                Colonoscopy, with snare polypectomy x 2, biopsies,                            and submucosal injection Indications:              Rectal bleeding. Patient has a history of                            adenomatous polyps. Last exam January 2011. Overdue                            for follow-up Medicines:                Monitored Anesthesia Care Procedure:                Pre-Anesthesia Assessment:                           - Prior to the procedure, a History and Physical                            was performed, and patient medications and                            allergies were reviewed. The patient's tolerance of                            previous anesthesia was also reviewed. The risks                            and benefits of the procedure and the sedation                            options and risks were discussed with the patient.                            All questions were answered, and informed consent                            was obtained. Prior Anticoagulants: The patient has                            taken no previous anticoagulant or antiplatelet                            agents. ASA Grade Assessment: II - A patient with                            mild systemic disease. After reviewing the risks  and benefits, the patient was deemed in                            satisfactory condition to undergo the procedure.                           After obtaining informed consent, the colonoscope                            was passed under direct vision. Throughout the                            procedure, the patient's blood pressure, pulse, and                            oxygen saturations were monitored  continuously. The                            Colonoscope was introduced through the anus and                            advanced to the the cecum, identified by                            appendiceal orifice and ileocecal valve. The                            ileocecal valve, appendiceal orifice, and rectum                            were photographed. The quality of the bowel                            preparation was excellent. The colonoscopy was                            performed without difficulty. The patient tolerated                            the procedure well. The bowel preparation used was                            SUPREP. Scope In: 8:38:05 AM Scope Out: 9:01:23 AM Scope Withdrawal Time: 0 hours 18 minutes 40 seconds  Total Procedure Duration: 0 hours 23 minutes 18 seconds  Findings:                 RECTAL EXAM: No palpable polypoid mass posteriorly                            just right of midline.                           Two polyps were found in the sigmoid colon and  descending colon. The polyps were 4 to 6 mm in                            size. These polyps were removed with a cold snare.                            Resection and retrieval were complete.                           An ulcerated non-obstructing mass was found in the                            distal rectum(Posterior and just right of midline)                            all way to the dentate line. The mass measured two                            cm in length. In addition, its diameter measured                            twenty-five mm. This was biopsied with a cold                            forceps for histology. Area was tattooed with an                            injection of Spot (carbon black) just proximal to                            the lesion. See photograph..                           Multiple diverticula were found in the entire colon.                            Internal hemorrhoids were found during retroflexion. Complications:            No immediate complications. Estimated blood loss:                            None. Estimated Blood Loss:     Estimated blood loss: none. Impression:               - Two 4 to 6 mm polyps in the sigmoid colon and in                            the descending colon, removed with a cold snare.                            Resected and retrieved.                           - Rule out  malignancy, tumor in the distal rectum.                            Biopsied. Tattooed.                           - Diverticulosis in the entire examined colon.                           - Internal hemorrhoids. Recommendation:           - Repeat colonoscopy date to be determined after                            pending pathology results are reviewed for                            surveillance.                           - Patient has a contact number available for                            emergencies. The signs and symptoms of potential                            delayed complications were discussed with the                            patient. Return to normal activities tomorrow.                            Written discharge instructions were provided to the                            patient.                           - Resume previous diet.                           - Continue present medications.                           - Await pathology results. If biopsies reveal                            cancer then he will need CT, EUS, surgical                            consultation. If biopsies negative for cancer that                            he will need surgical referral for transanal                            excision.                           -  We will arrange surgical evaluation with Dr.                            Leighton Ruff Docia Chuck. Henrene Pastor, MD 11/24/2016 9:16:42 AM This report has been signed electronically.

## 2016-11-24 NOTE — Progress Notes (Signed)
Pathology not sent per [facility] per DO

## 2016-11-24 NOTE — Progress Notes (Signed)
Called to room to assist during endoscopic procedure.  Patient ID and intended procedure confirmed with present staff. Received instructions for my participation in the procedure from the performing physician.  

## 2016-11-24 NOTE — Patient Instructions (Signed)
YOU HAD AN ENDOSCOPIC PROCEDURE TODAY AT [facility]:   Refer to the procedure report that was given to you for any specific questions about what was found during the examination.  If the procedure report does not answer your questions, please call your gastroenterologist to clarify.  If you requested that your care partner not be given the details of your procedure findings, then the procedure report has been included in a sealed envelope for you to review at your convenience later.  YOU SHOULD EXPECT: Some feelings of bloating in the abdomen. Passage of more gas than usual.  Walking can help get rid of the air that was put into your GI tract during the procedure and reduce the bloating. If you had a lower endoscopy (such as a colonoscopy or flexible sigmoidoscopy) you may notice spotting of blood in your stool or on the toilet paper. If you underwent a bowel prep for your procedure, you may not have a normal bowel movement for a few days.  Please Note:  You might notice some irritation and congestion in your nose or some drainage.  This is from the oxygen used during your procedure.  There is no need for concern and it should clear up in a day or so.  SYMPTOMS TO REPORT IMMEDIATELY:   Following lower endoscopy (colonoscopy or flexible sigmoidoscopy):  Excessive amounts of blood in the stool  Significant tenderness or worsening of abdominal pains  Swelling of the abdomen that is new, acute  Fever of 100F or higher   For urgent or emergent issues, a gastroenterologist can be reached at any hour by calling [phone].   DIET:  We do recommend a small meal at first, but then you may proceed to your regular diet.  Drink plenty of fluids but you should avoid alcoholic beverages for 24 hours.  ACTIVITY:  You should plan to take it easy for the rest of today and you should NOT DRIVE or use heavy machinery until tomorrow (because of the sedation medicines used during the test).     FOLLOW UP: Our staff will call the number listed on your records the next business day following your procedure to check on you and address any questions or concerns that you may have regarding the information given to you following your procedure. If we do not reach you, we will leave a message.  However, if you are feeling well and you are not experiencing any problems, there is no need to return our call.  We will assume that you have returned to your regular daily activities without incident.  If any biopsies were taken you will be contacted by phone or by letter within the next 1-3 weeks.  Please call us at [phone] if you have not heard about the biopsies in 3 weeks.   Polyps (handout given) Diverticulosis (handout given) Hemorrhoids (handout given) Await for biopsy results to determined next repeat colonoscopy.    SIGNATURES/CONFIDENTIALITY: You and/or your care partner have signed paperwork which will be entered into your electronic medical record.  These signatures attest to the fact that that the information above on your After Visit Summary has been reviewed and is understood.  Full responsibility of the confidentiality of this discharge information lies with you and/or your care-partner.

## 2016-11-25 ENCOUNTER — Telehealth: Payer: Self-pay

## 2016-11-25 NOTE — Telephone Encounter (Signed)
  Follow up Call-  Call back number 11/24/2016  Post procedure Call Back phone  # [phone]  Permission to leave phone message Yes  Some recent data might be hidden     Patient questions:  Do you have a fever, pain , or abdominal swelling? No. Pain Score  0 *  Have you tolerated food without any problems? Yes.    Have you been able to return to your normal activities? Yes.    Do you have any questions about your discharge instructions: Diet   No. Medications  No. Follow up visit  No.  Do you have questions or concerns about your Care? No.  Actions: * If pain score is 4 or above: No action needed, pain <4.

## 2016-11-27 ENCOUNTER — Encounter: Payer: Self-pay | Admitting: Internal Medicine

## 2016-11-27 ENCOUNTER — Other Ambulatory Visit: Payer: Self-pay

## 2016-11-27 DIAGNOSIS — C20 Malignant neoplasm of rectum: Secondary | ICD-10-CM

## 2016-11-28 ENCOUNTER — Other Ambulatory Visit: Payer: Self-pay

## 2016-11-28 ENCOUNTER — Encounter: Payer: Self-pay | Admitting: Radiation Oncology

## 2016-11-28 ENCOUNTER — Other Ambulatory Visit (INDEPENDENT_AMBULATORY_CARE_PROVIDER_SITE_OTHER): Payer: Medicare HMO

## 2016-11-28 DIAGNOSIS — C20 Malignant neoplasm of rectum: Secondary | ICD-10-CM

## 2016-11-28 LAB — BASIC METABOLIC PANEL
BUN: 16 mg/dL (ref 6–23)
CO2: 27 mEq/L (ref 19–32)
Calcium: 9.6 mg/dL (ref 8.4–10.5)
Chloride: 104 mEq/L (ref 96–112)
Creatinine, Ser: 1.13 mg/dL (ref 0.40–1.50)
GFR: 66.46 mL/min (ref >60.00)
Glucose, Bld: 136 mg/dL — ABNORMAL HIGH (ref 70–99)
Potassium: 4.5 mEq/L (ref 3.5–5.1)
SODIUM: 138 meq/L (ref 135–145)

## 2016-12-02 ENCOUNTER — Telehealth: Payer: Self-pay

## 2016-12-02 ENCOUNTER — Other Ambulatory Visit: Payer: Self-pay

## 2016-12-02 ENCOUNTER — Telehealth: Payer: Self-pay | Admitting: Internal Medicine

## 2016-12-02 ENCOUNTER — Telehealth: Payer: Self-pay | Admitting: Oncology

## 2016-12-02 ENCOUNTER — Encounter: Payer: Self-pay | Admitting: Oncology

## 2016-12-02 DIAGNOSIS — K6289 Other specified diseases of anus and rectum: Secondary | ICD-10-CM

## 2016-12-02 NOTE — Telephone Encounter (Signed)
Appt has been scheduled for the pt to see Dr. Benay Spice on 6/7 at 8am. Pt was unable to come to the appt on 6/1 bc he is going out of town. Pt aware to arrive 15 minutes early. Address verified. Letter mailed.

## 2016-12-02 NOTE — Telephone Encounter (Signed)
Spoke with pt and his wife and let them know. Spouse felt better after phone discussion and thanked me for the call.

## 2016-12-02 NOTE — Telephone Encounter (Signed)
I mentioned this to them previously. The approach to rectal cancer is multidisciplinary. I did mention that they may need to speak to radiation oncologist, oncologist, surgeons, and endoscopic ultrasound specialists. Nothing new or different since we last met.

## 2016-12-02 NOTE — Telephone Encounter (Signed)
Called [facility] and they can see pt at [facility] office 12/09/16. Pt states he will not be back from [place] until 12/10/16. Jackie at [facility] will call pt to see if they can get him in sooner.

## 2016-12-02 NOTE — Progress Notes (Addendum)
GI Location of Tumor / Histology: Rectum  Brendan Meyer presented  months ago with symptoms of: Rectal bleeding,change in bowel habits,increased intestinal pain  Biopsies of  (if applicable) revealed: Diagnosis 11/24/2016: 1. Surgical [P], descending, polyp - TUBULAR ADENOMA (ONE FRAGMENT). - NO HIGH GRADE DYSPLASIA OR MALIGNANCY. 2. Surgical [P], sigmoid, polyp - TUBULAR ADENOMA (THREE FRAGMENTS). - NO HIGH GRADE DYSPLASIA OR MALIGNANCY. 3. Surgical [P], rectum - INVASIVE COLORECTAL ADENOCARCINOMA  Past/Anticipated interventions by surgeon, if any: Dr. Scarlette Shorts, MD  11/24/16 Colonoscopy with bx  Past/Anticipated interventions by medical oncology, if any:Dr. Benay Spice, referral placed 11/28/16, not scheduled as yet  Weight changes, if any:No  Bowel/Bladder complaints, if any: no bleeding past week, regular bowel s, has a lot of gas, doesn't completely empty when voiding,   Nausea / Vomiting, if any: NO  Pain issues, if any: No  Any blood per rectum:   NO  SAFETY ISSUES: NO  Prior radiation? NO  Pacemaker/ICD? NO  Is the patient on methotrexate? NO  Current Complaints/Details: 2011 colon polyps last colonoscopy 07/2009; tubular adenomas, Bells palsy,s/p CABG, x 6 8/212;CAD,, A-Fib, coronary artery aneurysm,  DM,Depression,drinks alcohol, no tobacco products or drug use Maternal aunt breast cancer,   12/04/16 CT Abd/Pelvis scheduled @ 945 am BP (!) 143/63 Comment: left arm  Pulse 76   Temp 98.5 F (36.9 C) (Oral)   Resp 18   Ht 5\' 11"  (1.803 m)   Wt 162 lb 9.6 oz (73.8 kg)   SpO2 100%   BMI 22.68 kg/m   Wt Readings from Last 3 Encounters:  12/03/16 162 lb 9.6 oz (73.8 kg)  11/24/16 165 lb (74.8 kg)  10/08/16 165 lb (74.8 kg)     Allergies: NKA

## 2016-12-02 NOTE — Telephone Encounter (Signed)
Pts wife states they would like a call from Dr. Henrene Pastor. Concerned, state they were told he was going to have surgery and now they have Rad/onc appt and med/onc appt. States we must know things are very bad and haven't told them. They understood that he would have surgery, did not expect these other apptts.  Discussed with pts wife that I would send message to Dr. Henrene Pastor to have him call to answer their questions.

## 2016-12-02 NOTE — Progress Notes (Signed)
Radiation Oncology         [phone] ________________________________  Name: Brendan Meyer MRN: [id number]  Date: 12/03/2016  DOB: August 12, 1934  XN:ATFTD, Annie Main, MD  Irene Shipper, MD     REFERRING PHYSICIAN: Irene Shipper, MD   DIAGNOSIS: The encounter diagnosis was Rectal cancer [facility]).   HISTORY OF PRESENT ILLNESS: Brendan Meyer is a 82 y.o. male seen at the request of Dr. Henrene Pastor and Dr. Benay Spice for a new diagnosis of rectal cancer. Thea ptient was found to have increased intestinal gas pain, change in bowel habits, and rectal bleeding. He had a history of tubular adenomas in 2011 but did not follow up for repeat colonooscopy at the 5 year interval. His symptoms of firm stools and pain progressed along with blood in his stools for several months. He proceeded with colonoscopy on 11/24/16 which revealed two polyps in the sigmoid colon and descending colon, and an ulceratge non-obstructing mss in the distal rectum all the way to the dentate line 2 cm in length and 25 mm in diameter. Biopsies of his colon polyps were negative for malignancy or dysplasia but revealed tubular adenoma. The rectal biopsy revealed invasive adenocarcinoma. He's scheduled for staging CT scans tomorrow, and is due to see Dr. [name] but the scheduling must have been miscommunicated as he's scheduled for the first week of July. Plans have also been set for EUS with Dr. Ardis Hughs, though he's not had this scheduled formally. He's due to see Dr. Benay Spice next week on 12/11/16, and comes to review options of radiotherapy as a part of his care.  PREVIOUS RADIATION THERAPY: No   PAST MEDICAL HISTORY:  Past Medical History:  Diagnosis Date  . Bell's palsy   . Cataract 08/18/2016   cataract removal with implant left eye  . Coronary artery disease    s/p CABG  . Depression   . Diabetes mellitus   . Hyperlipidemia   . Kidney stones   . Neuropathy        PAST SURGICAL HISTORY: Past Surgical History:    Procedure Laterality Date  . CIRCUMCISION    . COLONOSCOPY    . CORONARY ARTERY BYPASS GRAFT  02/2011   x 6 vessel  . CYSTOSCOPY    . CYSTOSCOPY/RETROGRADE/URETEROSCOPY Bilateral 11/11/2013   Procedure: CYSTOSCOPY, BILATERAL RETROGRADE PYELOGRAM LEFT URETEROSCOPY, COLLECTION OF LEFT URETERAL WASHINGS FOR CYTOLOGY;  Surgeon: Ardis Hughs, MD;  Location: [facility];  Service: Urology;  Laterality: Bilateral;  . eardrum    . HERNIA REPAIR     double  . LITHOTRIPSY    . POLYPECTOMY    . TONSILLECTOMY    . UPPER GASTROINTESTINAL ENDOSCOPY       FAMILY HISTORY:  Family History  Problem Relation Age of Onset  . Breast cancer Maternal Aunt   . Colon cancer Neg Hx   . Colon polyps Neg Hx   . Esophageal cancer Neg Hx   . Rectal cancer Neg Hx   . Stomach cancer Neg Hx      SOCIAL HISTORY:  reports that he has never smoked. He has never used smokeless tobacco. He reports that he drinks alcohol. He reports that he does not use drugs. The patient is married and lives in [place]. 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 He and his wife moved to [place] a few years ago to be closer to family.  ALLERGIES: Patient has no known allergies.   MEDICATIONS:  Current Outpatient Prescriptions  Medication  Sig Dispense Refill  . aspirin EC 81 MG tablet Take 81 mg by mouth daily.    . Cholecalciferol (VITAMIN D) 2000 UNITS CAPS Take 2,000 Units by mouth daily.    . Insulin Glargine (LANTUS SOLOSTAR) 100 UNIT/ML Solostar Pen Inject 40 Units into the skin every evening.    . latanoprost (XALATAN) 0.005 % ophthalmic solution Place 1 drop into both eyes at bedtime.     . metFORMIN (GLUCOPHAGE) 500 MG tablet Take 500 mg by mouth 2 (two) times daily with a meal.      . metoprolol tartrate (LOPRESSOR) 25 MG tablet Take 25 mg by mouth at bedtime.     . prednisoLONE acetate (PRED FORTE) 1 % ophthalmic suspension Place 1 drop into the left eye daily.     . simvastatin (ZOCOR) 40 MG  tablet Take 40 mg by mouth at bedtime.      Marland Kitchen zolpidem (AMBIEN) 10 MG tablet Take 5 mg by mouth at bedtime as needed for sleep.    Marland Kitchen ketorolac (ACULAR) 0.4 % SOLN     . omeprazole (PRILOSEC) 20 MG capsule Take 20 mg by mouth every other day.      Current Facility-Administered Medications  Medication Dose Route Frequency Provider Last Rate Last Dose  . 0.9 %  sodium chloride infusion  500 mL Intravenous Continuous Irene Shipper, MD         REVIEW OF SYSTEMS: On review of systems, the patient reports that he is doing well overall. He reports he continues to have abdominal gassiness and  denies any chest pain, shortness of breath, cough, fevers, chills, night sweats, unintended weight changes. He denies any new bladder disturbances, and denies abdominal pain, nausea or vomiting. He continues to have intermittent constipation and abdominal gassiness. He denies any current rectal bleeding and reports that the last time he saw this was following his biopsy. He denies any new musculoskeletal or joint aches or pains. A complete review of systems is obtained and is otherwise negative.     PHYSICAL EXAM:  Wt Readings from Last 3 Encounters:  12/03/16 162 lb 9.6 oz (73.8 kg)  11/24/16 165 lb (74.8 kg)  10/08/16 165 lb (74.8 kg)   Temp Readings from Last 3 Encounters:  12/03/16 98.5 F (36.9 C) (Oral)  11/24/16 97.3 F (36.3 C) (Other (Comment))  11/12/13 98.7 F (37.1 C) (Oral)   BP Readings from Last 3 Encounters:  12/03/16 (!) 143/63  11/24/16 124/70  10/08/16 136/68   Pulse Readings from Last 3 Encounters:  12/03/16 76  11/24/16 68  10/08/16 76   Pain Assessment Pain Score: 0-No pain/10  In general this is a well appearing caucasian male in no acute distress. He is alert and oriented x4 and appropriate throughout the examination. HEENT reveals that the patient is normocephalic, atraumatic. EOMs are intact. PERRLA. Skin is intact without any evidence of gross lesions. Cardiovascular  exam reveals a regular rate and rhythm, no clicks rubs or murmurs are auscultated. Chest is clear to auscultation bilaterally. Lymphatic assessment is performed and does not reveal any adenopathy in the cervical, supraclavicular, axillary, or inguinal chains. Abdomen has active bowel sounds in all quadrants and is intact. The abdomen is soft, non tender, non distended. Lower extremities are negative for pretibial pitting edema, deep calf tenderness, cyanosis or clubbing. DRE is deferred to simulation.    ECOG = 1  0 - Asymptomatic (Fully active, able to carry on all predisease activities without restriction)  1 - Symptomatic  but completely ambulatory (Restricted in physically strenuous activity but ambulatory and able to carry out work of a light or sedentary nature. For example, light housework, office work)  2 - Symptomatic, <50% in bed during the day (Ambulatory and capable of all self care but unable to carry out any work activities. Up and about more than 50% of waking hours)  3 - Symptomatic, >50% in bed, but not bedbound (Capable of only limited self-care, confined to bed or chair 50% or more of waking hours)  4 - Bedbound (Completely disabled. Cannot carry on any self-care. Totally confined to bed or chair)  5 - Death   Eustace Pen MM, Creech RH, Tormey DC, et al. [phone]). "Toxicity and response criteria of the [facility]". [place] Oncol. 5 (6): 649-55    LABORATORY DATA:  Lab Results  Component Value Date   WBC 4.8 11/08/2013   HGB 14.6 11/08/2013   HCT 43.9 11/08/2013   MCV 85.6 11/08/2013   PLT 222 11/08/2013   Lab Results  Component Value Date   NA 138 11/28/2016   K 4.5 11/28/2016   CL 104 11/28/2016   CO2 27 11/28/2016   Lab Results  Component Value Date   ALT 13 02/05/2010   AST 20 02/05/2010   ALKPHOS 40 02/05/2010   BILITOT 0.7 02/05/2010      RADIOGRAPHY: No results found.     IMPRESSION/PLAN: 1. Unstaged adenocarcinoma of the  rectum. Dr. Lisbeth Renshaw discusses the pathology findings and reviews the nature of adenocarcinoma of the rectum. Recommendations include continuing his workup with systemic imaging to rule out metastatic disease. He is going for CT scan of chest abdomen and pelvis tomorrow. He plans to be out of town Friday. Next Wednesday, and will see Dr. Benay Spice on 12/11/2016. We're hopeful that his EUS and visit with Dr. [name] will also be in the very near future. Dr. Lisbeth Renshaw reviews options when patients have T3 disease which we suspect and that would include neoadjuvant chemo and radiotherapy. We discussed the risks, benefits, short, and long term effects of radiotherapy, and the patient is interested in proceeding. Dr. Lisbeth Renshaw discusses the delivery and logistics of radiotherapy and would recommend a 5 1/2-6 week course. We will await the results of this workup, and plan to move forward accordingly. The patient is in agreement and we will touch base with him next week to ensure that additional workup has been completed or in progress.    The above documentation reflects my direct findings during this shared patient visit. Please see the separate note by Dr. Lisbeth Renshaw on this date for the remainder of the patient's plan of care.    Carola Rhine, PAC

## 2016-12-03 ENCOUNTER — Ambulatory Visit
Admission: RE | Admit: 2016-12-03 | Discharge: 2016-12-03 | Disposition: A | Discharge status: Home / Self Care | Payer: Medicare HMO | Source: Ambulatory Visit | Attending: Radiation Oncology | Admitting: Radiation Oncology

## 2016-12-03 ENCOUNTER — Encounter: Payer: Self-pay | Admitting: Radiation Oncology

## 2016-12-03 VITALS — BP 143/63 | HR 76 | Temp 98.5°F | Resp 18 | Ht 71.0 in | Wt 162.6 lb

## 2016-12-03 DIAGNOSIS — Z9889 Other specified postprocedural states: Secondary | ICD-10-CM | POA: Insufficient documentation

## 2016-12-03 DIAGNOSIS — E785 Hyperlipidemia, unspecified: Secondary | ICD-10-CM | POA: Insufficient documentation

## 2016-12-03 DIAGNOSIS — C20 Malignant neoplasm of rectum: Secondary | ICD-10-CM

## 2016-12-03 DIAGNOSIS — E119 Type 2 diabetes mellitus without complications: Secondary | ICD-10-CM | POA: Diagnosis not present

## 2016-12-03 DIAGNOSIS — F329 Major depressive disorder, single episode, unspecified: Secondary | ICD-10-CM | POA: Insufficient documentation

## 2016-12-03 DIAGNOSIS — Z794 Long term (current) use of insulin: Secondary | ICD-10-CM | POA: Diagnosis not present

## 2016-12-03 DIAGNOSIS — Z951 Presence of aortocoronary bypass graft: Secondary | ICD-10-CM | POA: Diagnosis not present

## 2016-12-03 DIAGNOSIS — Z803 Family history of malignant neoplasm of breast: Secondary | ICD-10-CM | POA: Insufficient documentation

## 2016-12-03 DIAGNOSIS — I2510 Atherosclerotic heart disease of native coronary artery without angina pectoris: Secondary | ICD-10-CM | POA: Diagnosis not present

## 2016-12-03 DIAGNOSIS — Z79899 Other long term (current) drug therapy: Secondary | ICD-10-CM | POA: Insufficient documentation

## 2016-12-03 DIAGNOSIS — Z87442 Personal history of urinary calculi: Secondary | ICD-10-CM | POA: Diagnosis not present

## 2016-12-03 DIAGNOSIS — Z510 Encounter for antineoplastic radiation therapy: Secondary | ICD-10-CM | POA: Insufficient documentation

## 2016-12-03 DIAGNOSIS — Z7982 Long term (current) use of aspirin: Secondary | ICD-10-CM | POA: Insufficient documentation

## 2016-12-03 NOTE — Progress Notes (Signed)
John, I have heard about Mr. Jerilynn Mages.  Waiting to know what the CT scans show before scheduling EUS.  Thanks  DJ

## 2016-12-03 NOTE — Progress Notes (Signed)
Please see the Nurse Progress Note in the MD Initial Consult Encounter for this patient. 

## 2016-12-04 ENCOUNTER — Ambulatory Visit (INDEPENDENT_AMBULATORY_CARE_PROVIDER_SITE_OTHER)
Admission: RE | Admit: 2016-12-04 | Discharge: 2016-12-04 | Disposition: A | Discharge status: Home / Self Care | Payer: Medicare HMO | Source: Ambulatory Visit | Attending: Internal Medicine | Admitting: Internal Medicine

## 2016-12-04 DIAGNOSIS — C189 Malignant neoplasm of colon, unspecified: Secondary | ICD-10-CM | POA: Diagnosis not present

## 2016-12-04 DIAGNOSIS — C20 Malignant neoplasm of rectum: Secondary | ICD-10-CM

## 2016-12-04 DIAGNOSIS — K6289 Other specified diseases of anus and rectum: Secondary | ICD-10-CM

## 2016-12-04 DIAGNOSIS — K629 Disease of anus and rectum, unspecified: Secondary | ICD-10-CM

## 2016-12-04 DIAGNOSIS — K5730 Diverticulosis of large intestine without perforation or abscess without bleeding: Secondary | ICD-10-CM | POA: Diagnosis not present

## 2016-12-04 MED ORDER — IOPAMIDOL (ISOVUE-300) INJECTION 61%
100.0000 mL | Freq: Once | INTRAVENOUS | Status: AC | PRN
  Administered 2016-12-04: 100 mL via INTRAVENOUS

## 2016-12-08 ENCOUNTER — Other Ambulatory Visit: Payer: Self-pay

## 2016-12-08 DIAGNOSIS — C20 Malignant neoplasm of rectum: Secondary | ICD-10-CM

## 2016-12-11 ENCOUNTER — Encounter: Payer: Medicare HMO | Admitting: Internal Medicine

## 2016-12-11 ENCOUNTER — Ambulatory Visit (HOSPITAL_BASED_OUTPATIENT_CLINIC_OR_DEPARTMENT_OTHER): Payer: Medicare HMO | Admitting: Oncology

## 2016-12-11 ENCOUNTER — Telehealth: Payer: Self-pay | Admitting: Oncology

## 2016-12-11 VITALS — BP 145/62 | HR 64 | Temp 97.7°F | Resp 18 | Ht 71.0 in | Wt 160.4 lb

## 2016-12-11 DIAGNOSIS — C20 Malignant neoplasm of rectum: Secondary | ICD-10-CM

## 2016-12-11 DIAGNOSIS — E119 Type 2 diabetes mellitus without complications: Secondary | ICD-10-CM

## 2016-12-11 NOTE — Telephone Encounter (Signed)
Appointments scheduled per 12/11/16. Patient was given a copy of the AVS report and appointment schedule, per 12/11/16 los.

## 2016-12-11 NOTE — Progress Notes (Signed)
[facility] Patient Consult   Referring MD: Layla Gramm 82 y.o.  February 01, 1935    Reason for Referral: Rectal cancer   HPI: He reports rectal bleeding beginning in November 2017. He also developed discharge with flatus. He was referred to Dr. Henrene Pastor and underwent a colonoscopy 11/24/2016. Small polyps were noted in the sigmoid colon and descending colon. The polyps were removed. A mass was noted in the distal rectum. The mass was biopsied and tattooed. The pathology revealed tubular adenomas involving the descending and sigmoid polyps. The rectum biopsy returned as invasive adenocarcinoma.  Mr.Routh saw Dr. Lisbeth Renshaw 12/02/2016. Neoadjuvant radiation is recommended if he has a T3 lesion. He is scheduled to see Dr. [name] on 12/16/2016. He is scheduled for an endoscopic ultrasound by Dr. Ardis Hughs on 12/18/2016.  He was referred for staging CTs of the chest, abdomen, and pelvis on 12/04/2016. A soft tissue mass was noted at the right anterolateral wall of the inferior rectum. No pathologically enlarged lymph nodes. No evidence of metastatic disease in the chest, abdomen, or pelvis.    Past Medical History:  Diagnosis Date  . Bell's palsy   . Cataract 08/18/2016   cataract removal with implant left eye  . Coronary artery disease    s/p CABG  . Depression   . Diabetes mellitus   . Hyperlipidemia   . Kidney stones   . Neuropathy     . History of a urinary tract infection    Past Surgical History:  Procedure Laterality Date  . CIRCUMCISION    . COLONOSCOPY    . CORONARY ARTERY BYPASS GRAFT  02/2011   x 6 vessel  . CYSTOSCOPY    . CYSTOSCOPY/RETROGRADE/URETEROSCOPY Bilateral 11/11/2013   Procedure: CYSTOSCOPY, BILATERAL RETROGRADE PYELOGRAM LEFT URETEROSCOPY, COLLECTION OF LEFT URETERAL WASHINGS FOR CYTOLOGY;  Surgeon: Ardis Hughs, MD;  Location: [facility];  Service: Urology;  Laterality: Bilateral;  . eardrum    . HERNIA REPAIR  H 27    double-Inguinal   . LITHOTRIPSY    . POLYPECTOMY    . TONSILLECTOMY    . UPPER GASTROINTESTINAL ENDOSCOPY      Medications: Reviewed  Allergies: No Known Allergies  Family history: No family history of cancer  Social History:   He lives with his wife in [place]. He is retired improves he worked in [employer]. He does not use cigarettes or alcohol. No transfusion history. No risk factor for HIV or hepatitis.  ROS: Positives include: Rectal bleeding since November 2017, chronic "sciatica "-several years, worse with sitting, rectal discharge when he passes gas   A complete ROS was otherwise negative.  Physical Exam:  Blood pressure (!) 145/62, pulse 64, temperature 97.7 F (36.5 C), temperature source Oral, resp. rate 18, height 5\' 11"  (1.803 m), weight 160 lb 6.4 oz (72.8 kg), SpO2 100 %.  HEENT: Upper and lower denture plate, oropharynx without visible mass, neck without mass Lungs: Distant breath sounds, no respiratory distress Cardiac: Regular rate and rhythm Abdomen: No hepatosplenomegaly, no mass, nontender  Vascular: No leg edema Lymph nodes: No cervical, supraclavicular, axillary, or inguinal nodes Neurologic: Alert and oriented, the motor exam appears intact in the upper and lower extremities Skin: No rash Musculoskeletal: No spine tenderness Rectal: Normal tone, mass at the right posterior lateral rectum beginning approximately 1 cm from the anal verge   LAB: CBC, chemistry panel, and CEA to be obtained day of chemotherapy teaching class   Imaging: CT  12/04/2016-images reviewed  Assessment/Plan:   1. Rectal cancer, colonoscopy 11/24/2016 revealed a rectal mass extending to the dentate line, biopsy confirmed invasive adenocarcinoma  CTs of the chest, abdomen, and pelvis 12/04/2016-negative for metastatic disease, no lymphadenopathy, rectal mass visualized 2. Descending and sigmoid polyps removed at colonoscopy 11/24/2016-tubular  adenomas  3.   History of coronary artery disease, status post coronary artery bypass surgery  4.   Diabetes   Disposition:   Mr. Lamp has been diagnosed with rectal cancer. He appears to have early stage disease based on the staging evaluation to date. He will undergo an EUS next week. The plan is to proceed with neoadjuvant Xeloda/radiation if he has at least a T3 lesion or node positive lesion.  He is scheduled see Dr. [name] for surgical planning.  I discussed the treatment of rectal cancer with him. We discussed the rationale for neoadjuvant therapy. I recommend concurrent Xeloda and radiation. We reviewed the potential toxicities associated with Xeloda including the chance for nausea, mucositis, diarrhea, and hematologic toxicity. We discussed the sun sensitivity, rash, hyperpigmentation, and hand/foot syndrome associated with Xeloda. He agrees to proceed.  He will be scheduled for baseline laboratory studies and a chemotherapy teaching class when he returns for an office visit on 12/19/2016.  50 minutes were spent with the patient today. The majority of the time was used for counseling and coordination of care.  Donneta Romberg, MD  12/11/2016, 8:30 AM

## 2016-12-16 ENCOUNTER — Telehealth: Payer: Self-pay | Admitting: Pharmacist

## 2016-12-16 DIAGNOSIS — C20 Malignant neoplasm of rectum: Secondary | ICD-10-CM

## 2016-12-16 MED ORDER — CAPECITABINE 500 MG PO TABS: 1500.0000 mg | ORAL_TABLET | Freq: Two times a day (BID) | ORAL | 0 refills | Status: DC

## 2016-12-16 NOTE — Telephone Encounter (Signed)
Oral Chemotherapy Pharmacist Encounter  Received new Xeloda prescription to be taken with concurrent radiation for the treatment of rectal cancer  11/28/16 BMET reviewed, OK for treatment No other recent labs in Epic Patient with scheduled CBC and CMET for 12/19/16  Current medication list in Epic assessed, no DDIs with Xeloda identified  Prior authorization submitted on CoverMyMeds Key [id number] Status is pending  Prescription has been e-scribed to [facility] will continue to follow.  Johny Drilling, PharmD, BCPS, BCOP 12/16/2016  9:23 AM Oral Oncology Clinic [phone]

## 2016-12-17 ENCOUNTER — Encounter: Payer: Self-pay | Admitting: *Deleted

## 2016-12-18 ENCOUNTER — Ambulatory Visit (HOSPITAL_COMMUNITY): Payer: Medicare HMO | Admitting: Registered Nurse

## 2016-12-18 ENCOUNTER — Encounter (HOSPITAL_COMMUNITY)
Admission: RE | Disposition: A | Discharge status: Home / Self Care | Payer: Self-pay | Source: Ambulatory Visit | Attending: Gastroenterology

## 2016-12-18 ENCOUNTER — Encounter (HOSPITAL_COMMUNITY): Payer: Self-pay | Admitting: *Deleted

## 2016-12-18 ENCOUNTER — Ambulatory Visit (HOSPITAL_COMMUNITY)
Admission: RE | Admit: 2016-12-18 | Discharge: 2016-12-18 | Disposition: A | Discharge status: Home / Self Care | Payer: Medicare HMO | Source: Ambulatory Visit | Attending: Gastroenterology | Admitting: Gastroenterology

## 2016-12-18 DIAGNOSIS — I2510 Atherosclerotic heart disease of native coronary artery without angina pectoris: Secondary | ICD-10-CM | POA: Insufficient documentation

## 2016-12-18 DIAGNOSIS — Z951 Presence of aortocoronary bypass graft: Secondary | ICD-10-CM | POA: Diagnosis not present

## 2016-12-18 DIAGNOSIS — Z7982 Long term (current) use of aspirin: Secondary | ICD-10-CM | POA: Diagnosis not present

## 2016-12-18 DIAGNOSIS — C20 Malignant neoplasm of rectum: Secondary | ICD-10-CM | POA: Insufficient documentation

## 2016-12-18 DIAGNOSIS — Z86010 Personal history of colonic polyps: Secondary | ICD-10-CM | POA: Diagnosis not present

## 2016-12-18 DIAGNOSIS — E1140 Type 2 diabetes mellitus with diabetic neuropathy, unspecified: Secondary | ICD-10-CM | POA: Insufficient documentation

## 2016-12-18 DIAGNOSIS — K219 Gastro-esophageal reflux disease without esophagitis: Secondary | ICD-10-CM | POA: Insufficient documentation

## 2016-12-18 DIAGNOSIS — Z79899 Other long term (current) drug therapy: Secondary | ICD-10-CM | POA: Diagnosis not present

## 2016-12-18 DIAGNOSIS — K5730 Diverticulosis of large intestine without perforation or abscess without bleeding: Secondary | ICD-10-CM | POA: Diagnosis not present

## 2016-12-18 DIAGNOSIS — I255 Ischemic cardiomyopathy: Secondary | ICD-10-CM | POA: Diagnosis not present

## 2016-12-18 DIAGNOSIS — E785 Hyperlipidemia, unspecified: Secondary | ICD-10-CM | POA: Insufficient documentation

## 2016-12-18 DIAGNOSIS — Z7984 Long term (current) use of oral hypoglycemic drugs: Secondary | ICD-10-CM | POA: Insufficient documentation

## 2016-12-18 HISTORY — PX: EUS: SHX5427

## 2016-12-18 LAB — GLUCOSE, CAPILLARY: Glucose-Capillary: 86 mg/dL (ref 65–99)

## 2016-12-18 SURGERY — ULTRASOUND, LOWER GI TRACT, ENDOSCOPIC
Anesthesia: Monitor Anesthesia Care

## 2016-12-18 MED ORDER — LIDOCAINE 2% (20 MG/ML) 5 ML SYRINGE
INTRAMUSCULAR | Status: AC
  Filled 2016-12-18: qty 5

## 2016-12-18 MED ORDER — LIDOCAINE 2% (20 MG/ML) 5 ML SYRINGE
INTRAMUSCULAR | Status: DC | PRN
  Administered 2016-12-18: 50 mg via INTRAVENOUS

## 2016-12-18 MED ORDER — PROPOFOL 10 MG/ML IV BOLUS
INTRAVENOUS | Status: AC
  Filled 2016-12-18: qty 40

## 2016-12-18 MED ORDER — LACTATED RINGERS IV SOLN
INTRAVENOUS | Status: DC
  Administered 2016-12-18: 11:00:00 via INTRAVENOUS

## 2016-12-18 MED ORDER — SODIUM CHLORIDE 0.9 % IV SOLN: INTRAVENOUS | Status: DC

## 2016-12-18 MED ORDER — PROPOFOL 10 MG/ML IV BOLUS
INTRAVENOUS | Status: DC | PRN
  Administered 2016-12-18: 20 mg via INTRAVENOUS

## 2016-12-18 MED ORDER — PROPOFOL 500 MG/50ML IV EMUL
INTRAVENOUS | Status: DC | PRN
  Administered 2016-12-18: 120 ug/kg/min via INTRAVENOUS

## 2016-12-18 NOTE — Anesthesia Postprocedure Evaluation (Signed)
Anesthesia Post Note  Patient: Brendan Meyer  Procedure(s) Performed: Procedure(s) (LRB): LOWER ENDOSCOPIC ULTRASOUND (EUS) (N/A)     Patient location during evaluation: PACU Anesthesia Type: MAC Level of consciousness: awake and alert and oriented Pain management: pain level controlled Vital Signs Assessment: post-procedure vital signs reviewed and stable Respiratory status: spontaneous breathing, nonlabored ventilation and respiratory function stable Cardiovascular status: stable and blood pressure returned to baseline Anesthetic complications: no    Last Vitals:  Vitals:   12/18/16 1100 12/18/16 1140  BP: (!) 189/80 (!) 107/58  Pulse: 93 69  Resp: 17 14  Temp: 36.7 C 36.8 C    Last Pain:  Vitals:   12/18/16 1100  TempSrc: Oral                 Grettel Rames A.

## 2016-12-18 NOTE — Transfer of Care (Signed)
Immediate Anesthesia Transfer of Care Note  Patient: Brendan Meyer  Procedure(s) Performed: Procedure(s): LOWER ENDOSCOPIC ULTRASOUND (EUS) (N/A)  Patient Location: PACU and Endoscopy Unit  Anesthesia Type:MAC  Level of Consciousness: awake, alert , oriented and patient cooperative  Airway & Oxygen Therapy: Patient Spontanous Breathing and Patient connected to face mask oxygen  Post-op Assessment: Report given to RN, Post -op Vital signs reviewed and stable and Patient moving all extremities  Post vital signs: Reviewed and stable  Last Vitals:  Vitals:   12/18/16 1100  BP: (!) 189/80  Pulse: 93  Resp: 17  Temp: 36.7 C    Last Pain:  Vitals:   12/18/16 1100  TempSrc: Oral         Complications: No apparent anesthesia complications

## 2016-12-18 NOTE — Anesthesia Preprocedure Evaluation (Addendum)
Anesthesia Evaluation  Patient identified by MRN, date of birth, ID band Patient awake    Reviewed: Allergy & Precautions, NPO status , Patient's Chart, lab work & pertinent test results, reviewed documented beta blocker date and time   Airway Mallampati: II  TM Distance: >3 FB Neck ROM: Full    Dental  (+) Lower Dentures, Upper Dentures   Pulmonary shortness of breath and with exertion,    Pulmonary exam normal breath sounds clear to auscultation       Cardiovascular + angina + CAD and + CABG  Normal cardiovascular exam+ dysrhythmias Atrial Fibrillation  Rhythm:Regular Rate:Normal  Ischemic CM LVEF 45-50%   Neuro/Psych PSYCHIATRIC DISORDERS Depression  Neuromuscular disease    GI/Hepatic GERD  Medicated and Controlled,Rectal Ca Diverticulosis   Endo/Other  diabetes, Poorly Controlled, Type 2, Insulin Dependent, Oral Hypoglycemic AgentsHyperlipidemia  Renal/GU Renal diseaseHx/o renal calculi  negative genitourinary   Musculoskeletal negative musculoskeletal ROS (+)   Abdominal   Peds  Hematology negative hematology ROS (+)   Anesthesia Other Findings   Reproductive/Obstetrics                             Anesthesia Physical Anesthesia Plan  ASA: III  Anesthesia Plan: MAC   Post-op Pain Management:    Induction: Intravenous  PONV Risk Score and Plan: 1 and Ondansetron and Propofol  Airway Management Planned: Natural Airway and Simple Face Mask  Additional Equipment:   Intra-op Plan:   Post-operative Plan:   Informed Consent: I have reviewed the patients History and Physical, chart, labs and discussed the procedure including the risks, benefits and alternatives for the proposed anesthesia with the patient or authorized representative who has indicated his/her understanding and acceptance.     Plan Discussed with: CRNA, Anesthesiologist and Surgeon  Anesthesia Plan Comments:          Anesthesia Quick Evaluation

## 2016-12-18 NOTE — Op Note (Signed)
[facility] Patient Name: Brendan Meyer Procedure Date: 12/18/2016 MRN: [id number] Attending MD: Milus Banister , MD Date of Birth: [birth year] CSN: [id number] Age: 82 Admit Type: Outpatient Procedure:                Lower EUS Indications:              staging of recently diagnosed rectal                            adenocarcinoma, no sign of metastatic disease on                            recent imaging Providers:                Milus Banister, MD, Cleda Daub, RN, [place]elle                            Alday CRNA, CRNA, Cherylynn Ridges, Technician Referring MD:             Scarlette Shorts, MD Medicines:                Monitored Anesthesia Care Complications:            No immediate complications. Estimated blood loss:                            None. Estimated Blood Loss:     Estimated blood loss: none. Procedure:                Pre-Anesthesia Assessment:                           - Prior to the procedure, a History and Physical                            was performed, and patient medications and                            allergies were reviewed. The patient's tolerance of                            previous anesthesia was also reviewed. The risks                            and benefits of the procedure and the sedation                            options and risks were discussed with the patient.                            All questions were answered, and informed consent                            was obtained. Prior Anticoagulants: The patient has  taken no previous anticoagulant or antiplatelet                            agents. ASA Grade Assessment: II - A patient with                            mild systemic disease. After reviewing the risks                            and benefits, the patient was deemed in                            satisfactory condition to undergo the procedure.                           After obtaining informed  consent, the endoscope was                            passed under direct vision. Throughout the                            procedure, the patient's blood pressure, pulse, and                            oxygen saturations were monitored continuously. The                            [id number] ([id number]) scope was introduced through                            the anus and advanced to the the sigmoid colon. The                            lower EUS was accomplished without difficulty. The                            patient tolerated the procedure well. The quality                            of the bowel preparation was good. Scope In: Scope Out: Findings:      Sigmoidoscopic findings:      1. Clearly malignant mass in the distal rectum along posterior wall,       measuring 1-2cm cranial-caudal length with distal edge 0.5cm from the       anal verge. This is not obstructive.      Endosonographic Finding :      1. The mass above correlates with a hypoechoic, heterogeneous lesion.       The distal edge of the mass lays 0.5cm from the anal verge. The mass was       non-circumferential and located predominantly at the posterior rectal       wall. The endosonographic borders were irregular. The mass measured 18       mm (in maximum length) by 4 mm (in maximum thickness). There was  sonographic evidence suggesting invasion into the submucosa (Layer 3)       without extension into the muscularis propria (uT1)      2. No perirectal adenopathy (uN0). Impression:               - 1.8cm non-obstructive, non-circumferential uT1N0                            adenocarcinoma located along posterior wall of the                            distal rectum with distal edge located 0.5cm from                            the anal verge Moderate Sedation:      N/A- Per Anesthesia Care Recommendation:           - Discharge patient to home (ambulatory). Procedure Code(s):        --- Professional ---                            [phone], Sigmoidoscopy, flexible; with endoscopic                            ultrasound examination Diagnosis Code(s):        --- Professional ---                           K62.89, Other specified diseases of anus and rectum                           C21.8, Malignant neoplasm of overlapping sites of                            rectum, anus and anal canal CPT copyright 2016 [employer] Association. All rights reserved. The codes documented in this report are preliminary and upon coder review may  be revised to meet current compliance requirements. Milus Banister, MD 12/18/2016 11:50:44 AM This report has been signed electronically. Number of Addenda: 0

## 2016-12-18 NOTE — Interval H&P Note (Signed)
History and Physical Interval Note:  12/18/2016 11:09 AM  Brendan Meyer  has presented today for surgery, with the diagnosis of rectal cancer staging   The various methods of treatment have been discussed with the patient and family. After consideration of risks, benefits and other options for treatment, the patient has consented to  Procedure(s): LOWER ENDOSCOPIC ULTRASOUND (EUS) (N/A) as a surgical intervention .  The patient's history has been reviewed, patient examined, no change in status, stable for surgery.  I have reviewed the patient's chart and labs.  Questions were answered to the patient's satisfaction.     Milus Banister

## 2016-12-18 NOTE — H&P (View-Only) (Signed)
[facility] Patient Consult   Referring MD: Dawsyn Ramsaran 82 y.o.  December 08, 1934    Reason for Referral: Rectal cancer   HPI: He reports rectal bleeding beginning in November 2017. He also developed discharge with flatus. He was referred to Dr. Henrene Pastor and underwent a colonoscopy 11/24/2016. Small polyps were noted in the sigmoid colon and descending colon. The polyps were removed. A mass was noted in the distal rectum. The mass was biopsied and tattooed. The pathology revealed tubular adenomas involving the descending and sigmoid polyps. The rectum biopsy returned as invasive adenocarcinoma.  Mr.Iglesias saw Dr. Lisbeth Renshaw 12/02/2016. Neoadjuvant radiation is recommended if he has a T3 lesion. He is scheduled to see Dr. [name] on 12/16/2016. He is scheduled for an endoscopic ultrasound by Dr. Ardis Hughs on 12/18/2016.  He was referred for staging CTs of the chest, abdomen, and pelvis on 12/04/2016. A soft tissue mass was noted at the right anterolateral wall of the inferior rectum. No pathologically enlarged lymph nodes. No evidence of metastatic disease in the chest, abdomen, or pelvis.    Past Medical History:  Diagnosis Date  . Bell's palsy   . Cataract 08/18/2016   cataract removal with implant left eye  . Coronary artery disease    s/p CABG  . Depression   . Diabetes mellitus   . Hyperlipidemia   . Kidney stones   . Neuropathy     . History of a urinary tract infection    Past Surgical History:  Procedure Laterality Date  . CIRCUMCISION    . COLONOSCOPY    . CORONARY ARTERY BYPASS GRAFT  02/2011   x 6 vessel  . CYSTOSCOPY    . CYSTOSCOPY/RETROGRADE/URETEROSCOPY Bilateral 11/11/2013   Procedure: CYSTOSCOPY, BILATERAL RETROGRADE PYELOGRAM LEFT URETEROSCOPY, COLLECTION OF LEFT URETERAL WASHINGS FOR CYTOLOGY;  Surgeon: Ardis Hughs, MD;  Location: [facility];  Service: Urology;  Laterality: Bilateral;  . eardrum    . HERNIA REPAIR  H 27    double-Inguinal   . LITHOTRIPSY    . POLYPECTOMY    . TONSILLECTOMY    . UPPER GASTROINTESTINAL ENDOSCOPY      Medications: Reviewed  Allergies: No Known Allergies  Family history: No family history of cancer  Social History:   He lives with his wife in [place]. He is retired improves he worked in [employer]. He does not use cigarettes or alcohol. No transfusion history. No risk factor for HIV or hepatitis.  ROS: Positives include: Rectal bleeding since November 2017, chronic "sciatica "-several years, worse with sitting, rectal discharge when he passes gas   A complete ROS was otherwise negative.  Physical Exam:  Blood pressure (!) 145/62, pulse 64, temperature 97.7 F (36.5 C), temperature source Oral, resp. rate 18, height 5\' 11"  (1.803 m), weight 160 lb 6.4 oz (72.8 kg), SpO2 100 %.  HEENT: Upper and lower denture plate, oropharynx without visible mass, neck without mass Lungs: Distant breath sounds, no respiratory distress Cardiac: Regular rate and rhythm Abdomen: No hepatosplenomegaly, no mass, nontender  Vascular: No leg edema Lymph nodes: No cervical, supraclavicular, axillary, or inguinal nodes Neurologic: Alert and oriented, the motor exam appears intact in the upper and lower extremities Skin: No rash Musculoskeletal: No spine tenderness Rectal: Normal tone, mass at the right posterior lateral rectum beginning approximately 1 cm from the anal verge   LAB: CBC, chemistry panel, and CEA to be obtained day of chemotherapy teaching class   Imaging: CT  12/04/2016-images reviewed  Assessment/Plan:   1. Rectal cancer, colonoscopy 11/24/2016 revealed a rectal mass extending to the dentate line, biopsy confirmed invasive adenocarcinoma  CTs of the chest, abdomen, and pelvis 12/04/2016-negative for metastatic disease, no lymphadenopathy, rectal mass visualized 2. Descending and sigmoid polyps removed at colonoscopy 11/24/2016-tubular  adenomas  3.   History of coronary artery disease, status post coronary artery bypass surgery  4.   Diabetes   Disposition:   Mr. Critzer has been diagnosed with rectal cancer. He appears to have early stage disease based on the staging evaluation to date. He will undergo an EUS next week. The plan is to proceed with neoadjuvant Xeloda/radiation if he has at least a T3 lesion or node positive lesion.  He is scheduled see Dr. [name] for surgical planning.  I discussed the treatment of rectal cancer with him. We discussed the rationale for neoadjuvant therapy. I recommend concurrent Xeloda and radiation. We reviewed the potential toxicities associated with Xeloda including the chance for nausea, mucositis, diarrhea, and hematologic toxicity. We discussed the sun sensitivity, rash, hyperpigmentation, and hand/foot syndrome associated with Xeloda. He agrees to proceed.  He will be scheduled for baseline laboratory studies and a chemotherapy teaching class when he returns for an office visit on 12/19/2016.  50 minutes were spent with the patient today. The majority of the time was used for counseling and coordination of care.  Donneta Romberg, MD  12/11/2016, 8:30 AM

## 2016-12-18 NOTE — Discharge Instructions (Signed)

## 2016-12-19 ENCOUNTER — Encounter (HOSPITAL_COMMUNITY): Payer: Self-pay | Admitting: Gastroenterology

## 2016-12-19 ENCOUNTER — Other Ambulatory Visit (HOSPITAL_BASED_OUTPATIENT_CLINIC_OR_DEPARTMENT_OTHER): Payer: Medicare HMO

## 2016-12-19 ENCOUNTER — Telehealth: Payer: Self-pay | Admitting: Oncology

## 2016-12-19 ENCOUNTER — Other Ambulatory Visit: Payer: Medicare HMO

## 2016-12-19 ENCOUNTER — Ambulatory Visit (HOSPITAL_BASED_OUTPATIENT_CLINIC_OR_DEPARTMENT_OTHER): Payer: Medicare HMO | Admitting: Oncology

## 2016-12-19 ENCOUNTER — Telehealth: Payer: Self-pay | Admitting: *Deleted

## 2016-12-19 VITALS — BP 128/89 | HR 66 | Temp 98.0°F | Resp 18 | Ht 71.0 in | Wt 161.3 lb

## 2016-12-19 DIAGNOSIS — C20 Malignant neoplasm of rectum: Secondary | ICD-10-CM

## 2016-12-19 DIAGNOSIS — E119 Type 2 diabetes mellitus without complications: Secondary | ICD-10-CM

## 2016-12-19 LAB — CBC WITH DIFFERENTIAL/PLATELET
BASO%: 0.2 % (ref 0.0–2.0)
Basophils Absolute: 0 10*3/uL (ref 0.0–0.1)
EOS ABS: 0.2 10*3/uL (ref 0.0–0.5)
EOS%: 4.4 % (ref 0.0–7.0)
HCT: 42.8 % (ref 38.4–49.9)
HGB: 14.1 g/dL (ref 13.0–17.1)
LYMPH%: 32.8 % (ref 14.0–49.0)
MCH: 28.8 pg (ref 27.2–33.4)
MCHC: 32.9 g/dL (ref 32.0–36.0)
MCV: 87.3 fL (ref 79.3–98.0)
MONO#: 0.5 10*3/uL (ref 0.1–0.9)
MONO%: 11.2 % (ref 0.0–14.0)
NEUT#: 2.2 10*3/uL (ref 1.5–6.5)
NEUT%: 51.4 % (ref 39.0–75.0)
PLATELETS: 200 10*3/uL (ref 140–400)
RBC: 4.9 10*6/uL (ref 4.20–5.82)
RDW: 13.7 % (ref 11.0–14.6)
WBC: 4.3 10*3/uL (ref 4.0–10.3)
lymph#: 1.4 10*3/uL (ref 0.9–3.3)

## 2016-12-19 LAB — COMPREHENSIVE METABOLIC PANEL
ALT: 16 U/L (ref 0–55)
ANION GAP: 10 meq/L (ref 3–11)
AST: 19 U/L (ref 5–34)
Albumin: 3.8 g/dL (ref 3.5–5.0)
Alkaline Phosphatase: 44 U/L (ref 40–150)
BILIRUBIN TOTAL: 0.58 mg/dL (ref 0.20–1.20)
BUN: 13.3 mg/dL (ref 7.0–26.0)
CHLORIDE: 105 meq/L (ref 98–109)
CO2: 26 meq/L (ref 22–29)
Calcium: 9.9 mg/dL (ref 8.4–10.4)
Creatinine: 1.2 mg/dL (ref 0.7–1.3)
EGFR: 56 mL/min/{1.73_m2} — AB (ref >90)
GLUCOSE: 150 mg/dL — AB (ref 70–140)
POTASSIUM: 5 meq/L (ref 3.5–5.1)
SODIUM: 141 meq/L (ref 136–145)
Total Protein: 7 g/dL (ref 6.4–8.3)

## 2016-12-19 LAB — CEA (IN HOUSE-CHCC): CEA (CHCC-In House): 10.16 ng/mL — ABNORMAL HIGH (ref 0.00–5.00)

## 2016-12-19 MED ORDER — CAPECITABINE 500 MG PO TABS: 1500.0000 mg | ORAL_TABLET | Freq: Two times a day (BID) | ORAL | 0 refills | Status: DC

## 2016-12-19 NOTE — Progress Notes (Signed)
  [facility] OFFICE PROGRESS NOTE   Diagnosis:  Rectal cancer  INTERVAL HISTORY:   Brendan Meyer returns as scheduled. No new complaint. Minimal rectal discomfort following the EUS procedure yesterday. The EUS by Dr. Ardis Hughs 12/18/2016 revealed a mass at the distal posterior rectum measuring 1-2 centimeters. The distal edge of the mass is at 0.5 cm from the anal verge. Ultrasound confirmed a non-circumferential posterior rectal mass with invasion into the submucosa without extension into the muscularis propria. No perirectal adenopathy. The tumor was staged as a uT1N0 lesion.  Objective:  Vital signs in last 24 hours:  Blood pressure 128/89, pulse 66, temperature 98 F (36.7 C), temperature source Oral, resp. rate 18, height '5\' 11"'$  (1.803 m), weight 161 lb 4.8 oz (73.2 kg), SpO2 100 %.    Physical examination: Not performed today  Lab Results:  Lab Results  Component Value Date   WBC 4.3 12/19/2016   HGB 14.1 12/19/2016   HCT 42.8 12/19/2016   MCV 87.3 12/19/2016   PLT 200 12/19/2016   NEUTROABS 2.2 12/19/2016    CMP     Component Value Date/Time   NA 138 11/28/2016 1310   K 4.5 11/28/2016 1310   CL 104 11/28/2016 1310   CO2 27 11/28/2016 1310   GLUCOSE 136 (H) 11/28/2016 1310   BUN 16 11/28/2016 1310   CREATININE 1.13 11/28/2016 1310   CALCIUM 9.6 11/28/2016 1310   PROT 6.9 02/05/2010 1154   ALBUMIN 3.9 02/05/2010 1154   AST 20 02/05/2010 1154   ALT 13 02/05/2010 1154   ALKPHOS 40 02/05/2010 1154   BILITOT 0.7 02/05/2010 1154   GFRNONAA >60 02/14/2010 0540   GFRAA  02/14/2010 0540    >60        The eGFR has been calculated using the MDRD equation. This calculation has not been validated in all clinical situations. eGFR's persistently <60 mL/min signify possible Chronic Kidney Disease.     Medications: I have reviewed the patient's current medications.  Assessment/Plan: 1. Rectal cancer, colonoscopy 11/24/2016 revealed a rectal mass  extending to the dentate line, biopsy confirmed invasive adenocarcinoma ? CTs of the chest, abdomen, and pelvis 12/04/2016-negative for metastatic disease, no lymphadenopathy, rectal mass visualized ? EUS 12/18/2016,uT1N0 tumor with the distal edge at 0.5 cm from the anal verge ?  2. Descending and sigmoid polyps removed at colonoscopy 11/24/2016-tubular adenomas  3.   History of coronary artery disease, status post coronary artery bypass surgery  4.   Diabetes   Disposition:  Brendan Meyer has been diagnosed with a clinical stage I distal rectal cancer. The lesion is staged at T1 by ultrasound. He may be a candidate for transanal excision. We will cancel plans for neoadjuvant Stated been and radiation.  He request a second surgical opinion. We will ask Dr. Johney Maine to see him.  He will return for an office visit in approximately 6 weeks to review the surgical pathology report.  15 minutes were spent with the patient today. The majority of the time was used for counseling and coordination of care.  Donneta Romberg, MD  12/19/2016  9:37 AM

## 2016-12-19 NOTE — Telephone Encounter (Signed)
Called [facility]: Scheduler requested we send over a new referral to Dr. Johney Maine. Same done. MD will need to review and agree to accept patient. Office will contact us.  Referral faxed to [phone].

## 2016-12-19 NOTE — Telephone Encounter (Signed)
Scheduled appt per 6/15 los - Gave patient AVS and calender per LOS.  

## 2016-12-19 NOTE — Telephone Encounter (Signed)
Oral Chemotherapy Pharmacist Encounter  Received notification from MD to cancel Xeloda prescription. Patient with small tumor size and chemoradiation is not needed. I called [facility] and cancelled prescription.  No further needs from [facility] identified at this time. Oral Oncology Clinic will sign off. Please let us know if we can be of assistance in the future.   Johny Drilling, PharmD, BCPS, BCOP 12/19/2016  10:26 AM Oral Oncology Clinic [phone]

## 2016-12-19 NOTE — Telephone Encounter (Signed)
Oral Chemotherapy Pharmacist Encounter  Received notification from [facility] that [place] prior authorization has been approved under patient's Part B benefits. I notified [facility] to process prescription. Xeloda prescription will need to be sent to [facility] per insurance requirement.  I called [facility] at [phone] to set up patient account. Account set up in pharmacy system.  Xeloda prescription has been e-scribed to [facility] in [place], [place].  Oral Oncology Clinic will continue to follow.  Johny Drilling, PharmD, BCPS, BCOP 12/19/2016  10:02 AM Oral Oncology Clinic [phone]

## 2016-12-24 ENCOUNTER — Telehealth: Payer: Self-pay | Admitting: Internal Medicine

## 2016-12-24 ENCOUNTER — Telehealth: Payer: Self-pay | Admitting: *Deleted

## 2016-12-24 NOTE — Telephone Encounter (Signed)
Pt states he has seen Dr. [name] for surgery.He wants a second opinion with Dr. Johney Maine, states Dr. Benay Spice was going to refer him for that. Pt voiced frustration because he has called [facility] and has not been able to get appt with Dr. Johney Maine. Discussed with pt that he should call Dr. Bernette Redbird office and inquire regarding appt as it is mentioned in the office note from Dr. Bernette Redbird OV with the pt. Pt states he has a hard time getting through on the phone to that office and that sometimes the phone call just drops. Encouraged pt to show up in the lobby is he is unable to get someone over the phone. Pt verbalized understanding and thanked me for returning his call.

## 2016-12-24 NOTE — Telephone Encounter (Signed)
Message received from patient requesting a call back.  Call placed back to patient and patient states that he was calling to check on his appt with Dr. Johney Maine, but that Dr. Johney Maine' office has called him this afternoon and appt has been made. Patient appreciative of call back and has no further questions at this time.

## 2016-12-29 ENCOUNTER — Ambulatory Visit: Payer: Self-pay | Admitting: Surgery

## 2016-12-29 DIAGNOSIS — C20 Malignant neoplasm of rectum: Secondary | ICD-10-CM | POA: Diagnosis not present

## 2016-12-29 DIAGNOSIS — Z01818 Encounter for other preprocedural examination: Secondary | ICD-10-CM | POA: Diagnosis not present

## 2016-12-29 NOTE — H&P (Signed)
Brendan Meyer 12/29/2016 11:38 AM Location: [facility] Patient #: [id number] DOB: 1934/12/29 Married / Language: English / Race: Refused to Report/Unreported Male  History of Present Illness Brendan Hector MD; 12/29/2016 12:45 PM) The patient is a 82 year old male who presents with colorectal cancer. Note for "Colorectal cancer": ` ` ` Patient sent for surgical consultation at the request of the patient. Second opinion.  Chief Complaint: Very low rectal cancer. Wishes to avoid abdominal perineal resection.  The patient is a 82 year old elderly male found to have a very distal rectal mass. Biopsy consistent with adenocarcinoma. Surgical consultation requested. Dr. Marcello Meyer saw. She was concerned that may require abdominoperineal resection to remove if T3/T4 but most likely would do transanal resection first given did not being particularly large and patient's advanced age. She did give options, but patient apparently heard APR as only option. There have been a lot of deaths by family members and close friends. They were terrified of that option. Therefore, they wanted to get a second opinion.   Since he saw Dr. Marcello Meyer, he underwent endorectal ultrasound. Staged as uT1uN0 1.8cm cancer right posterior rectal wall. 5 mm from anal verge. CT scan showed no evidence of any distant metastatic disease. Discussed the GI tumor Board. Patient requested second opinion to see transanal excision an option.  (Review of systems as stated in this history (HPI) or in the review of systems. Otherwise all other 12 point ROS are negative)   Allergies Brendan Meyer, CMA; 12/29/2016 11:39 AM) No Known Allergies 12/16/2016 Allergies Reconciled  Medication History Brendan Meyer, CMA; 12/29/2016 11:40 AM) Zolpidem Tartrate (10MG  Tablet, Oral) Active. Latanoprost (0.005% Solution, Ophthalmic) Active. MetFORMIN HCl (500MG  Tablet, Oral) Active. Metoprolol Tartrate (25MG   Tablet, Oral) Active. PrednisoLONE Acetate (1% Suspension, Ophthalmic) Active. Simvastatin (40MG  Tablet, Oral) Active. Aspirin (81MG  Tablet DR, Oral) Active. Vitamin D (2000UNIT Tablet, Oral) Active. Medications Reconciled    Vitals Brendan Meyer CMA; 12/29/2016 11:41 AM) 12/29/2016 11:40 AM Weight: 161.8 lb Height: 71in Body Surface Area: 1.93 m Body Mass Index: 22.57 kg/m  Temp.: 97.13F  Pulse: 75 (Regular)  BP: 130/74 (Sitting, Left Arm, Standard)      Physical Exam Brendan Hector MD; 12/29/2016 12:27 PM)  General Mental Status-Alert. General Appearance-Not in acute distress, Not Sickly. Orientation-Oriented X3. Hydration-Well hydrated. Voice-Normal.  Integumentary Global Assessment Upon inspection and palpation of skin surfaces of the - Axillae: non-tender, no inflammation or ulceration, no drainage. and Distribution of scalp and body hair is normal. General Characteristics Temperature - normal warmth is noted.  Head and Neck Head-normocephalic, atraumatic with no lesions or palpable masses. Face Global Assessment - atraumatic, no absence of expression. Neck Global Assessment - no abnormal movements, no bruit auscultated on the right, no bruit auscultated on the left, no decreased range of motion, non-tender. Trachea-midline. Thyroid Gland Characteristics - non-tender.  Eye Eyeball - Left-Extraocular movements intact, No Nystagmus. Eyeball - Right-Extraocular movements intact, No Nystagmus. Cornea - Left-No Hazy. Cornea - Right-No Hazy. Sclera/Conjunctiva - Left-No scleral icterus, No Discharge. Sclera/Conjunctiva - Right-No scleral icterus, No Discharge. Pupil - Left-Direct reaction to light normal. Pupil - Right-Direct reaction to light normal.  ENMT Ears Pinna - Left - no drainage observed, no generalized tenderness observed. Right - no drainage observed, no generalized tenderness observed. Nose and  Sinuses External Inspection of the Nose - no destructive lesion observed. Inspection of the nares - Left - quiet respiration. Right - quiet respiration. Mouth and Throat Lips - Upper Lip - no fissures  observed, no pallor noted. Lower Lip - no fissures observed, no pallor noted. Nasopharynx - no discharge present. Oral Cavity/Oropharynx - Tongue - no dryness observed. Oral Mucosa - no cyanosis observed. Hypopharynx - no evidence of airway distress observed.  Chest and Lung Exam Inspection Movements - Normal and Symmetrical. Accessory muscles - No use of accessory muscles in breathing. Palpation Palpation of the chest reveals - Non-tender. Auscultation Breath sounds - Normal and Clear.  Cardiovascular Auscultation Rhythm - Regular. Murmurs & Other Heart Sounds - Auscultation of the heart reveals - No Murmurs and No Systolic Clicks.  Abdomen Inspection Inspection of the abdomen reveals - No Visible peristalsis and No Abnormal pulsations. Umbilicus - No Bleeding, No Urine drainage. Palpation/Percussion Palpation and Percussion of the abdomen reveal - Soft, Non Tender, No Rebound tenderness, No Rigidity (guarding) and No Cutaneous hyperesthesia. Note: Abdomen soft. Nontender, nondistended. No guarding. No diastasis. 5 mm umbilical hernia on cough only. Reducible. No other hernias  Male Genitourinary Sexual Maturity Tanner 5 - Adult hair pattern and Adult penile size and shape. Note: No inguinal hernias. Normal external genitalia. Epididymi, testes, and spermatic cords normal without any masses.  Rectal Note: Fungating mobile mass right posterior rectal wall. 7:00. 2 cm wide by 3 cm long. The distal aspect comes right down to the sphincters. Given friable. Mildly enlarged left lateral and right anterior internal hemorrhoids but no thrombosis.  Perianal skin clean with good hygiene. No pruritis ani. No pilonidal disease. No fissure. No abscess/fistula. Normal sphincter tone. No external  hemorrhoids. No condyloma warts. Tolerates digital rectal exam. No other rectal masses. Prostate smooth and not enlarged.  Peripheral Vascular Upper Extremity Inspection - Left - No Cyanotic nailbeds, Not Ischemic. Right - No Cyanotic nailbeds, Not Ischemic.  Neurologic Neurologic evaluation reveals -normal attention span and ability to concentrate, able to name objects and repeat phrases. Appropriate fund of knowledge , normal sensation and normal coordination. Mental Status Affect - not angry, not paranoid. Cranial Nerves-Normal Bilaterally. Gait-Normal.  Neuropsychiatric Mental status exam performed with findings of-able to articulate well with normal speech/language, rate, volume and coherence, thought content normal with ability to perform basic computations and apply abstract reasoning and no evidence of hallucinations, delusions, obsessions or homicidal/suicidal ideation.  Musculoskeletal Global Assessment Spine, Ribs and Pelvis - no instability, subluxation or laxity. Right Upper Extremity - no instability, subluxation or laxity.  Lymphatic Head & Neck  General Head & Neck Lymphatics: Bilateral - Description - No Localized lymphadenopathy. Axillary  General Axillary Region: Bilateral - Description - No Localized lymphadenopathy. Femoral & Inguinal  Generalized Femoral & Inguinal Lymphatics: Left - Description - No Localized lymphadenopathy. Right - Description - No Localized lymphadenopathy.    Assessment & Plan Brendan Hector MD; 12/29/2016 12:48 PM)  RECTAL CANCER (C20) Impression: 81 year old active male with T1 rectal cancer. Very distal.  Standard of care would be segmental colonic resection of draining lymph nodes. I'm concerned that he would be hard to get decent margins to do sphincter sparing surgery and therefore abdominal  Surgery with colostomy.  However, because this is a T1N0 cancer without any distant disease, and is reasonable to do an  aggressive TEM partial proctectomy transanal resection. There is no good data for neoadjuvant chemoradiation therapy preoperatively. Regroup after margins with a more aggressive survivable pathway.  He would like to try transanal resection first and is willing to have a closer survival pathway in the hopes that he can still have cure without having a permanent colostomy. He has  excellent exercise tolerance. I did caution him that should he have evidence of recurrence, I would be adamant that he will require abdominal perineal resection. I understand that he does not want a permanent colostomy, I noted that after 6-12 months he will come to terms with that and me glad that he is not dead. Hopefully he will not get recurrence and will not come to that.  Current Plans Pt Education - [facility] TEM Education (Jahmiya Guidotti): discussed with patient and provided information. PREOP COLON - ENCOUNTER FOR PREOPERATIVE EXAMINATION FOR GENERAL SURGICAL PROCEDURE (Z01.818)  Current Plans You are being scheduled for surgery- Our schedulers will call you.  You should hear from our office's scheduling department within 5 working days about the location, date, and time of surgery. We try to make accommodations for patient's preferences in scheduling surgery, but sometimes the OR schedule or the surgeon's schedule prevents [place] from making those accommodations.  If you have not heard from our office [phone]) in 5 working days, call the office and ask for your surgeon's nurse.  If you have other questions about your diagnosis, plan, or surgery, call the office and ask for your surgeon's nurse.  Written instructions provided Pt Education - Pamphlet Given - Laparoscopic Colorectal Surgery: discussed with patient and provided information. Pt Education - [facility] Colon Bowel Prep 2015 Miralax/Antibiotics Started Flagyl 500MG , 2 (two) Tablet SEE NOTE, #6, 12/29/2016, No Refill. Local Order: Take at 2pm, 3pm, and 10pm the day  prior to your colon operation Started Neomycin Sulfate 500MG , 2 (two) Tablet SEE NOTE, #6, 12/29/2016, No Refill. Local Order: TAKE TWO TABLETS AT 2 PM, 3 PM, AND 10 PM THE DAY PRIOR TO SURGERY

## 2016-12-30 ENCOUNTER — Encounter (HOSPITAL_COMMUNITY): Payer: Self-pay

## 2016-12-30 ENCOUNTER — Encounter (HOSPITAL_COMMUNITY)
Admission: RE | Admit: 2016-12-30 | Discharge: 2016-12-30 | Disposition: A | Discharge status: Home / Self Care | Payer: Medicare HMO | Source: Ambulatory Visit | Attending: Surgery | Admitting: Surgery

## 2016-12-30 DIAGNOSIS — Z01818 Encounter for other preprocedural examination: Secondary | ICD-10-CM | POA: Diagnosis not present

## 2016-12-30 DIAGNOSIS — Z01810 Encounter for preprocedural cardiovascular examination: Secondary | ICD-10-CM

## 2016-12-30 DIAGNOSIS — I25709 Atherosclerosis of coronary artery bypass graft(s), unspecified, with unspecified angina pectoris: Secondary | ICD-10-CM | POA: Insufficient documentation

## 2016-12-30 DIAGNOSIS — I2541 Coronary artery aneurysm: Secondary | ICD-10-CM

## 2016-12-30 DIAGNOSIS — I5030 Unspecified diastolic (congestive) heart failure: Secondary | ICD-10-CM | POA: Diagnosis not present

## 2016-12-30 DIAGNOSIS — E785 Hyperlipidemia, unspecified: Secondary | ICD-10-CM | POA: Insufficient documentation

## 2016-12-30 DIAGNOSIS — I252 Old myocardial infarction: Secondary | ICD-10-CM | POA: Insufficient documentation

## 2016-12-30 DIAGNOSIS — Z01812 Encounter for preprocedural laboratory examination: Secondary | ICD-10-CM | POA: Insufficient documentation

## 2016-12-30 DIAGNOSIS — I080 Rheumatic disorders of both mitral and aortic valves: Secondary | ICD-10-CM | POA: Diagnosis not present

## 2016-12-30 DIAGNOSIS — I77810 Thoracic aortic ectasia: Secondary | ICD-10-CM | POA: Diagnosis not present

## 2016-12-30 DIAGNOSIS — E119 Type 2 diabetes mellitus without complications: Secondary | ICD-10-CM | POA: Insufficient documentation

## 2016-12-30 HISTORY — DX: Personal history of urinary calculi: Z87.442

## 2016-12-30 HISTORY — DX: Benign prostatic hyperplasia without lower urinary tract symptoms: N40.0

## 2016-12-30 HISTORY — DX: Cardiac arrhythmia, unspecified: I49.9

## 2016-12-30 LAB — ABO/RH: ABO/RH(D): A POS

## 2016-12-30 NOTE — Patient Instructions (Signed)
Brendan Meyer  12/30/2016   Your procedure is scheduled on: 12/31/2016   Report to [facility] Main  Entrance Take [place]  elevators to 3rd floor to  [place] at   1230pm    Call this number if you have problems the morning of surgery [phone]    Remember: ONLY 1 PERSON MAY GO WITH YOU TO SHORT STAY TO GET  READY MORNING OF YOUR SURGERY.  Do not eat food or drink liquids :After Midnight.     Take these medicines the morning of surgery with A SIP OF WATER: none  DO NOT TAKE ANY DIABETIC MEDICATIONS DAY OF YOUR SURGERY              Take 1/2 of evening dose of Lantus Insuilin nite before surgery.                                You may not have any metal on your body including hair pins and              piercings  Do not wear jewelry,  lotions, powders or perfumes, deodorant                        Men may shave face and neck.   Do not bring valuables to the hospital. [place].  Contacts, dentures or bridgework may not be worn into surgery.  Leave suitcase in the car. After surgery it may be brought to your room.                       Please read over the following fact sheets you were given: _____________________________________________________________________                CLEAR LIQUID DIET   Foods Allowed                                                                     Foods Excluded  Coffee and tea, regular and decaf                             liquids that you cannot  Plain Jell-O in any flavor                                             see through such as: Fruit ices (not with fruit pulp)                                     milk, soups, orange juice  Iced Popsicles  All solid food Carbonated beverages, regular and diet                                    Cranberry, grape and apple juices Sports drinks like Gatorade Lightly seasoned  clear broth or consume(fat free) Sugar, honey syrup  Sample Menu Breakfast                                Lunch                                     Supper Cranberry juice                    Beef broth                            Chicken broth Jell-O                                     Grape juice                           Apple juice Coffee or tea                        Jell-O                                      Popsicle                                                Coffee or tea                        Coffee or tea  _____________________________________________________________________  [facility] - Preparing for Surgery Before surgery, you can play an important role.  Because skin is not sterile, your skin needs to be as free of germs as possible.  You can reduce the number of germs on your skin by washing with CHG (chlorahexidine gluconate) soap before surgery.  CHG is an antiseptic cleaner which kills germs and bonds with the skin to continue killing germs even after washing. Please DO NOT use if you have an allergy to CHG or antibacterial soaps.  If your skin becomes reddened/irritated stop using the CHG and inform your nurse when you arrive at Short Stay. Do not shave (including legs and underarms) for at least 48 hours prior to the first CHG shower.  You may shave your face/neck. Please follow these instructions carefully:  1.  Shower with CHG Soap the night before surgery and the  morning of Surgery.  2.  If you choose to wash your hair, wash your hair first as usual with your  normal  shampoo.  3.  After you shampoo, rinse your hair and body thoroughly to remove the  shampoo.  4.  Use CHG as you would any other liquid soap.  You can apply chg directly  to the skin and wash                       Gently with a scrungie or clean washcloth.  5.  Apply the CHG Soap to your body ONLY FROM THE NECK DOWN.   Do not use on face/ open                           Wound or  open sores. Avoid contact with eyes, ears mouth and genitals (private parts).                       Wash face,  Genitals (private parts) with your normal soap.             6.  Wash thoroughly, paying special attention to the area where your surgery  will be performed.  7.  Thoroughly rinse your body with warm water from the neck down.  8.  DO NOT shower/wash with your normal soap after using and rinsing off  the CHG Soap.                9.  Pat yourself dry with a clean towel.            10.  Wear clean pajamas.            11.  Place clean sheets on your bed the night of your first shower and do not  sleep with pets. Day of Surgery : Do not apply any lotions/deodorants the morning of surgery.  Please wear clean clothes to the hospital/surgery center.  FAILURE TO FOLLOW THESE INSTRUCTIONS MAY RESULT IN THE CANCELLATION OF YOUR SURGERY PATIENT SIGNATURE_________________________________  NURSE SIGNATURE__________________________________  ________________________________________________________________________  WHAT IS A BLOOD TRANSFUSION? Blood Transfusion Information  A transfusion is the replacement of blood or some of its parts. Blood is made up of multiple cells which provide different functions.  Red blood cells carry oxygen and are used for blood loss replacement.  White blood cells fight against infection.  Platelets control bleeding.  Plasma helps clot blood.  Other blood products are available for specialized needs, such as hemophilia or other clotting disorders. BEFORE THE TRANSFUSION  Who gives blood for transfusions?   Healthy volunteers who are fully evaluated to make sure their blood is safe. This is blood bank blood. Transfusion therapy is the safest it has ever been in the practice of medicine. Before blood is taken from a donor, a complete history is taken to make sure that person has no history of diseases nor engages in risky social behavior (examples are  intravenous drug use or sexual activity with multiple partners). The donor's travel history is screened to minimize risk of transmitting infections, such as malaria. The donated blood is tested for signs of infectious diseases, such as HIV and hepatitis. The blood is then tested to be sure it is compatible with you in order to minimize the chance of a transfusion reaction. If you or a relative donates blood, this is often done in anticipation of surgery and is not appropriate for emergency situations. It takes many days to process the donated blood. RISKS AND COMPLICATIONS Although transfusion therapy is very safe and saves many lives, the main dangers of transfusion include:   Getting an infectious disease.  Developing a transfusion reaction.  This is an allergic reaction to something in the blood you were given. Every precaution is taken to prevent this. The decision to have a blood transfusion has been considered carefully by your caregiver before blood is given. Blood is not given unless the benefits outweigh the risks. AFTER THE TRANSFUSION  Right after receiving a blood transfusion, you will usually feel much better and more energetic. This is especially true if your red blood cells have gotten low (anemic). The transfusion raises the level of the red blood cells which carry oxygen, and this usually causes an energy increase.  The nurse administering the transfusion will monitor you carefully for complications. HOME CARE INSTRUCTIONS  No special instructions are needed after a transfusion. You may find your energy is better. Speak with your caregiver about any limitations on activity for underlying diseases you may have. SEEK MEDICAL CARE IF:   Your condition is not improving after your transfusion.  You develop redness or irritation at the intravenous (IV) site. SEEK IMMEDIATE MEDICAL CARE IF:  Any of the following symptoms occur over the next 12 hours:  Shaking chills.  You have a  temperature by mouth above 102 F (38.9 C), not controlled by medicine.  Chest, back, or muscle pain.  People around you feel you are not acting correctly or are confused.  Shortness of breath or difficulty breathing.  Dizziness and fainting.  You get a rash or develop hives.  You have a decrease in urine output.  Your urine turns a dark color or changes to pink, red, or brown. Any of the following symptoms occur over the next 10 days:  You have a temperature by mouth above 102 F (38.9 C), not controlled by medicine.  Shortness of breath.  Weakness after normal activity.  The white part of the eye turns yellow (jaundice).  You have a decrease in the amount of urine or are urinating less often.  Your urine turns a dark color or changes to pink, red, or brown. Document Released: 06/20/2000 Document Revised: 09/15/2011 Document Reviewed: 02/07/2008 [facility] Patient Information 2014 [place], [place].  _______________________________________________________________________

## 2016-12-30 NOTE — Progress Notes (Signed)
Requested HGBA1C done 12/2016 placed on chart.  Requested most recent office visit note from Dr Roma Schanz.   Patient not followed by cardiologist.

## 2016-12-30 NOTE — Progress Notes (Signed)
Made Dr Lissa Hoard ( anesthesia ) aware of patient history , EKG done 12/30/16 and EKG done 2015 from Dr Forde Dandy along with [place] note from [place] ( PCP) -09/2016.  Patient had OHS in 2012 with no cardiology followup since 2012.  ECHO from 2011 on chart.  DR Lissa Hoard stated to place call to [facility] and to have Dr Johney [place] call him regarding patient.  Placed call to [facility] and spoke with Triage, April , and she stated she would send Dr Johney [place] a message immediately for him to Call Dr Lissa Hoard .  Gave to April the cell number for Dr Lissa Hoard and the [facility] phone number.

## 2016-12-30 NOTE — Progress Notes (Addendum)
10/01/16-LOV- Dr Reynold Bowen on chart  ECHO-05/13/10-on chart 11/08/2013-EKG on chart  05/12/2011-LOV- Cardiology - epic  Stress Test - 2012-epic  CT chest - 12/04/16-epic  CBC/DIFF/CMP-12/19/16-epic

## 2016-12-31 ENCOUNTER — Telehealth: Payer: Self-pay | Admitting: Cardiovascular Disease

## 2016-12-31 LAB — TYPE AND SCREEN
ABO/RH(D): A POS
ANTIBODY SCREEN: NEGATIVE

## 2016-12-31 NOTE — Telephone Encounter (Signed)
Received records from [facility] for appointment on 01/02/17 with Dr Gwenlyn Found.  Records put with Dr Kennon Holter schedule for 01/02/17. lp

## 2017-01-02 ENCOUNTER — Encounter: Payer: Self-pay | Admitting: Cardiovascular Disease

## 2017-01-02 ENCOUNTER — Ambulatory Visit (HOSPITAL_COMMUNITY)
Admission: RE | Admit: 2017-01-02 | Discharge: 2017-01-02 | Disposition: A | Discharge status: Home / Self Care | Payer: Medicare HMO | Source: Ambulatory Visit | Attending: Cardiology | Admitting: Cardiology

## 2017-01-02 ENCOUNTER — Other Ambulatory Visit: Payer: Self-pay

## 2017-01-02 ENCOUNTER — Ambulatory Visit (INDEPENDENT_AMBULATORY_CARE_PROVIDER_SITE_OTHER): Payer: Medicare HMO | Admitting: Cardiovascular Disease

## 2017-01-02 ENCOUNTER — Ambulatory Visit (HOSPITAL_BASED_OUTPATIENT_CLINIC_OR_DEPARTMENT_OTHER): Payer: Medicare HMO

## 2017-01-02 VITALS — BP 132/68 | HR 76 | Ht 71.0 in | Wt 160.0 lb

## 2017-01-02 DIAGNOSIS — I77810 Thoracic aortic ectasia: Secondary | ICD-10-CM | POA: Insufficient documentation

## 2017-01-02 DIAGNOSIS — I5030 Unspecified diastolic (congestive) heart failure: Secondary | ICD-10-CM | POA: Diagnosis not present

## 2017-01-02 DIAGNOSIS — I080 Rheumatic disorders of both mitral and aortic valves: Secondary | ICD-10-CM | POA: Insufficient documentation

## 2017-01-02 DIAGNOSIS — Z01818 Encounter for other preprocedural examination: Secondary | ICD-10-CM | POA: Insufficient documentation

## 2017-01-02 LAB — EXERCISE TOLERANCE TEST
CHL CUP MPHR: 141 {beats}/min
CHL CUP RESTING HR STRESS: 67 {beats}/min
CHL CUP STRESS STAGE 1 DBP: 82 mmHg
CHL CUP STRESS STAGE 1 HR: 81 {beats}/min
CHL CUP STRESS STAGE 1 SBP: 167 mmHg
CHL CUP STRESS STAGE 1 SPEED: 0 mph
CHL CUP STRESS STAGE 2 GRADE: 0 %
CHL CUP STRESS STAGE 2 HR: 76 {beats}/min
CHL CUP STRESS STAGE 3 GRADE: 0.2 %
CHL CUP STRESS STAGE 4 GRADE: 10 %
CHL CUP STRESS STAGE 4 HR: 118 {beats}/min
CHL CUP STRESS STAGE 4 SBP: 163 mmHg
CHL CUP STRESS STAGE 4 SPEED: 1.7 mph
CHL CUP STRESS STAGE 5 DBP: 124 mmHg
CHL CUP STRESS STAGE 5 GRADE: 12 %
CHL CUP STRESS STAGE 5 HR: 155 {beats}/min
CHL CUP STRESS STAGE 5 SBP: 208 mmHg
CHL CUP STRESS STAGE 5 SPEED: 2.5 mph
CHL CUP STRESS STAGE 6 GRADE: 12 %
CHL CUP STRESS STAGE 6 HR: 160 {beats}/min
CHL CUP STRESS STAGE 7 HR: 137 {beats}/min
CHL CUP STRESS STAGE 7 SPEED: 0 mph
CHL CUP STRESS STAGE 8 HR: 88 {beats}/min
CHL CUP STRESS STAGE 8 SPEED: 0 mph
CSEPPHR: 160 {beats}/min
CSEPPMHR: 113 %
Estimated workload: 7 METS
Exercise duration (min): 6 min
Exercise duration (sec): 0 s
Percent HR: 113 %
RPE: 18
Stage 1 Grade: 0 %
Stage 2 Speed: 1 mph
Stage 3 HR: 76 {beats}/min
Stage 3 Speed: 1 mph
Stage 4 DBP: 106 mmHg
Stage 6 Speed: 2.5 mph
Stage 7 DBP: 66 mmHg
Stage 7 Grade: 0 %
Stage 7 SBP: 211 mmHg
Stage 8 DBP: 78 mmHg
Stage 8 Grade: 0 %
Stage 8 SBP: 184 mmHg

## 2017-01-02 LAB — ECHOCARDIOGRAM COMPLETE
HEIGHTINCHES: 71 in
WEIGHTICAEL: 2560 [oz_av]

## 2017-01-02 NOTE — Patient Instructions (Signed)
Medication Instructions: Your physician recommends that you continue on your current medications as directed. Please refer to the Current Medication list given to you today.  Testing/Procedures: Your physician has requested that you have an exercise tolerance test. For further information please visit [email or web address]. Please also follow instruction sheet, as given.  Your physician has requested that you have an echocardiogram. Echocardiography is a painless test that uses sound waves to create images of your heart. It provides your doctor with information about the size and shape of your heart and how well your heart's chambers and valves are working. This procedure takes approximately one hour. There are no restrictions for this procedure.  ---Pt is scheduled for surgery next Tuesday. Would like to have these tests done as soon as possible.  Follow-Up: Your physician wants you to follow-up in: 1 year with Dr. Gwenlyn Found. You will receive a reminder letter in the mail two months in advance. If you don't receive a letter, please call our office to schedule the follow-up appointment.  If you need a refill on your cardiac medications before your next appointment, please call your pharmacy.

## 2017-01-02 NOTE — Assessment & Plan Note (Signed)
History of CAD status post coronary bypass grafting 68/11/11 with a LIMA to the LAD, vein sequentially to the diagonal 1 and L and 1, vein to the fourth diagonal branch of the proximal PDA and distal PDA. He had a closure of a patent foramen ovale at that time. His initial EF was 35-40% which increased to 50-55% by subsequent 2-D echo. He has not been seen back since that time although he is fairly active and denies chest pain or shortness of breath.

## 2017-01-02 NOTE — Assessment & Plan Note (Signed)
History of hyperlipidemia on statin therapy followed by his PCP 

## 2017-01-02 NOTE — Assessment & Plan Note (Signed)
History of ischemic cardiomyopathy with initial ejection fraction of 35-40% at the time of bypass surgery which increased to 50 at 55% by subsequent to the echo. We will recheck a 2-D echocardiogram. He has no signs or symptoms of CHF at this time.

## 2017-01-02 NOTE — Progress Notes (Signed)
01/02/2017 Brendan Meyer   1934-10-15  [id number]  Primary Physician Reynold Bowen, MD Primary Cardiologist: Lorretta Harp MD Renae Gloss  HPI:   Brendan Meyer Is a pleasant 82 year old married Caucasian male father of 2 living children (1 deceased daughter), grandfather of 4 grandchildren referred for cardiovascular clearance before surgery next week for rectal cancer. His primary care provider is Dr. Roque Cash and surgeon Dr. Michael Boston. He has a history of diabetes and hyperlipidemia. He accordingly bypass grafting 02/14/10 with 6 grafts placed at that time. He had a LIMA to his LAD, a vein to a diagonal branch, obtuse marginal branch, and PDA. He also had a patent foraminal valley closed. His initial EF was 35-40% which had increased on subsequent echo to 50-55%. He has not seen a cardiologist since. He is fairly active and denies chest pain or shortness of breath. He does require rectal Cancer surgery next week and was referred here for cardiovascular clearance.   Current Outpatient Prescriptions  Medication Sig Dispense Refill  . aspirin EC 81 MG tablet Take 81 mg by mouth daily.    . Cholecalciferol (VITAMIN D) 2000 UNITS CAPS Take 2,000 Units by mouth daily.    Marland Kitchen latanoprost (XALATAN) 0.005 % ophthalmic solution Place 1 drop into both eyes at bedtime.     . metFORMIN (GLUCOPHAGE) 500 MG tablet Take 500 mg by mouth 2 (two) times daily with a meal.      . metoprolol tartrate (LOPRESSOR) 25 MG tablet Take 25 mg by mouth at bedtime.     . metroNIDAZOLE (FLAGYL) 500 MG tablet Take 500 mg by mouth 3 (three) times daily. On the day before surgery, take 1000mg  by mouth three times daily.    Marland Kitchen neomycin (MYCIFRADIN) 500 MG tablet Take 1,000 mg by mouth 3 (three) times daily. On the day before of surgery, take 1000mg  by mouth three times daily    . prednisoLONE acetate (PRED FORTE) 1 % ophthalmic suspension Place 1 drop into the left eye daily as needed.     .  research study medication Inject into the skin every evening. Investigational study drug-Insulin 48 units every evening at 8 pm    . simvastatin (ZOCOR) 40 MG tablet Take 40 mg by mouth every evening.     . zolpidem (AMBIEN) 10 MG tablet Take 10 mg by mouth at bedtime as needed for sleep.      Current Facility-Administered Medications  Medication Dose Route Frequency Provider Last Rate Last Dose  . 0.9 %  sodium chloride infusion  500 mL Intravenous Continuous Irene Shipper, MD        No Known Allergies  Social History   Social History  . Marital status: Married    Spouse name: N/A  . Number of children: N/A  . Years of education: N/A   Occupational History  . Not on file.   Social History Main Topics  . Smoking status: Never Smoker  . Smokeless tobacco: Never Used  . Alcohol use No  . Drug use: No  . Sexual activity: Not on file   Other Topics Concern  . Not on file   Social History Narrative   Retired-   Married with one son and two daughters   Patient has never smoked   Alcohol use-no   Daily caffeine use 2 per day   Illicit drug use -no     Review of Systems: General: negative for chills, fever, night sweats or weight  changes.  Cardiovascular: negative for chest pain, dyspnea on exertion, edema, orthopnea, palpitations, paroxysmal nocturnal dyspnea or shortness of breath Dermatological: negative for rash Respiratory: negative for cough or wheezing Urologic: negative for hematuria Abdominal: negative for nausea, vomiting, diarrhea, bright red blood per rectum, melena, or hematemesis Neurologic: negative for visual changes, syncope, or dizziness All other systems reviewed and are otherwise negative except as noted above.    Blood pressure 132/68, pulse 76, height 5\' 11"  (1.803 m), weight 160 lb (72.6 kg).  General appearance: alert and no distress Neck: no adenopathy, no carotid bruit, no JVD, supple, symmetrical, trachea midline and thyroid not enlarged,  symmetric, no tenderness/mass/nodules Lungs: clear to auscultation bilaterally Heart: regular rate and rhythm, S1, S2 normal, no murmur, click, rub or gallop Extremities: extremities normal, atraumatic, no cyanosis or edema  EKG Sinus rhythm at 76 with septal Q waves. I personally reviewed this EKG.  ASSESSMENT AND PLAN:   HYPERLIPIDEMIA-MIXED History of hyperlipidemia on statin therapy followed by his PCP.  CAD, ARTERY BYPASS GRAFT History of CAD status post coronary bypass grafting 68/11/11 with a LIMA to the LAD, vein sequentially to the diagonal 1 and L and 1, vein to the fourth diagonal branch of the proximal PDA and distal PDA. He had a closure of a patent foramen ovale at that time. His initial EF was 35-40% which increased to 50-55% by subsequent 2-D echo. He has not been seen back since that time although he is fairly active and denies chest pain or shortness of breath.  CARDIOMYOPATHY, ISCHEMIC History of ischemic cardiomyopathy with initial ejection fraction of 35-40% at the time of bypass surgery which increased to 50 at 55% by subsequent to the echo. We will recheck a 2-D echocardiogram. He has no signs or symptoms of CHF at this time.      Lorretta Harp MD FACP,FACC,FAHA, [facility] 01/02/2017 9:32 AM

## 2017-01-05 NOTE — Progress Notes (Signed)
Patient called and notified of time change for surgery and to report to third floor Short Stay at [facility] at 1115 am. May have clear liquids from midnight up until 0715 am morning of surgery then nothing after 0715 am. Patient verbalized understanding.

## 2017-01-05 NOTE — Progress Notes (Signed)
OR time changed to arrive at 10:15 patient notified.

## 2017-01-06 ENCOUNTER — Encounter (HOSPITAL_COMMUNITY): Payer: Self-pay | Admitting: Anesthesiology

## 2017-01-06 ENCOUNTER — Ambulatory Visit (HOSPITAL_COMMUNITY): Payer: Medicare HMO | Admitting: Certified Registered Nurse Anesthetist

## 2017-01-06 ENCOUNTER — Observation Stay (HOSPITAL_COMMUNITY)
Admission: RE | Admit: 2017-01-06 | Discharge: 2017-01-07 | Disposition: A | Discharge status: Home / Self Care | Payer: Medicare HMO | Source: Ambulatory Visit | Attending: Surgery | Admitting: Surgery

## 2017-01-06 ENCOUNTER — Encounter (HOSPITAL_COMMUNITY)
Admission: RE | Disposition: A | Discharge status: Home / Self Care | Payer: Self-pay | Source: Ambulatory Visit | Attending: Surgery

## 2017-01-06 DIAGNOSIS — I2510 Atherosclerotic heart disease of native coronary artery without angina pectoris: Secondary | ICD-10-CM | POA: Insufficient documentation

## 2017-01-06 DIAGNOSIS — E785 Hyperlipidemia, unspecified: Secondary | ICD-10-CM | POA: Diagnosis not present

## 2017-01-06 DIAGNOSIS — K5730 Diverticulosis of large intestine without perforation or abscess without bleeding: Secondary | ICD-10-CM | POA: Diagnosis not present

## 2017-01-06 DIAGNOSIS — I255 Ischemic cardiomyopathy: Secondary | ICD-10-CM | POA: Diagnosis not present

## 2017-01-06 DIAGNOSIS — C20 Malignant neoplasm of rectum: Secondary | ICD-10-CM | POA: Diagnosis not present

## 2017-01-06 DIAGNOSIS — Z951 Presence of aortocoronary bypass graft: Secondary | ICD-10-CM | POA: Diagnosis not present

## 2017-01-06 DIAGNOSIS — Z79899 Other long term (current) drug therapy: Secondary | ICD-10-CM | POA: Insufficient documentation

## 2017-01-06 DIAGNOSIS — N400 Enlarged prostate without lower urinary tract symptoms: Secondary | ICD-10-CM | POA: Insufficient documentation

## 2017-01-06 DIAGNOSIS — Z794 Long term (current) use of insulin: Secondary | ICD-10-CM | POA: Insufficient documentation

## 2017-01-06 DIAGNOSIS — K625 Hemorrhage of anus and rectum: Secondary | ICD-10-CM

## 2017-01-06 DIAGNOSIS — E119 Type 2 diabetes mellitus without complications: Secondary | ICD-10-CM | POA: Insufficient documentation

## 2017-01-06 DIAGNOSIS — I4891 Unspecified atrial fibrillation: Secondary | ICD-10-CM | POA: Insufficient documentation

## 2017-01-06 DIAGNOSIS — Z7982 Long term (current) use of aspirin: Secondary | ICD-10-CM | POA: Insufficient documentation

## 2017-01-06 DIAGNOSIS — Z87442 Personal history of urinary calculi: Secondary | ICD-10-CM | POA: Insufficient documentation

## 2017-01-06 HISTORY — PX: TRANSANAL ENDOSCOPIC MICROSURGERY: SHX5281

## 2017-01-06 LAB — GLUCOSE, CAPILLARY
GLUCOSE-CAPILLARY: 101 mg/dL — AB (ref 65–99)
GLUCOSE-CAPILLARY: 98 mg/dL (ref 65–99)
GLUCOSE-CAPILLARY: 98 mg/dL (ref 65–99)
GLUCOSE-CAPILLARY: 120 mg/dL — AB (ref 65–99)
Glucose-Capillary: 187 mg/dL — ABNORMAL HIGH (ref 65–99)

## 2017-01-06 SURGERY — MICROSURGERY, ENDOSCOPIC, ANAL APPROACH
Anesthesia: General

## 2017-01-06 MED ORDER — ONDANSETRON HCL 4 MG/2ML IJ SOLN
4.0000 mg | Freq: Four times a day (QID) | INTRAMUSCULAR | Status: DC | PRN
  Administered 2017-01-06: 4 mg via INTRAVENOUS
  Filled 2017-01-06: qty 2

## 2017-01-06 MED ORDER — SODIUM CHLORIDE 0.9% FLUSH: 3.0000 mL | INTRAVENOUS | Status: DC | PRN

## 2017-01-06 MED ORDER — METHOCARBAMOL 500 MG PO TABS: 500.0000 mg | ORAL_TABLET | Freq: Four times a day (QID) | ORAL | Status: DC | PRN

## 2017-01-06 MED ORDER — SODIUM CHLORIDE 0.9 % IJ SOLN
INTRAMUSCULAR | Status: AC
  Filled 2017-01-06: qty 10

## 2017-01-06 MED ORDER — DIAZEPAM 5 MG PO TABS: 5.0000 mg | ORAL_TABLET | Freq: Four times a day (QID) | ORAL | Status: DC | PRN

## 2017-01-06 MED ORDER — DIPHENHYDRAMINE HCL 12.5 MG/5ML PO ELIX: 12.5000 mg | ORAL_SOLUTION | Freq: Four times a day (QID) | ORAL | Status: DC | PRN

## 2017-01-06 MED ORDER — LIP MEDEX EX OINT: 1.0000 "application " | TOPICAL_OINTMENT | Freq: Two times a day (BID) | CUTANEOUS | Status: DC

## 2017-01-06 MED ORDER — DIAZEPAM 5 MG PO TABS: 5.0000 mg | ORAL_TABLET | Freq: Four times a day (QID) | ORAL | 2 refills | Status: DC | PRN

## 2017-01-06 MED ORDER — PROMETHAZINE HCL 25 MG/ML IJ SOLN: 6.2500 mg | INTRAMUSCULAR | Status: DC | PRN

## 2017-01-06 MED ORDER — LACTATED RINGERS IV BOLUS (SEPSIS): 1000.0000 mL | Freq: Three times a day (TID) | INTRAVENOUS | Status: DC | PRN

## 2017-01-06 MED ORDER — ACETAMINOPHEN 500 MG PO TABS
1000.0000 mg | ORAL_TABLET | ORAL | Status: AC
  Administered 2017-01-06: 1000 mg via ORAL
  Filled 2017-01-06: qty 2

## 2017-01-06 MED ORDER — PROPOFOL 10 MG/ML IV BOLUS
INTRAVENOUS | Status: DC | PRN
  Administered 2017-01-06: 150 mg via INTRAVENOUS

## 2017-01-06 MED ORDER — ACETAMINOPHEN 500 MG PO TABS
1000.0000 mg | ORAL_TABLET | Freq: Three times a day (TID) | ORAL | Status: DC
  Administered 2017-01-06 – 2017-01-07 (×2): 1000 mg via ORAL
  Filled 2017-01-06 (×2): qty 2

## 2017-01-06 MED ORDER — METOPROLOL TARTRATE 25 MG PO TABS
25.0000 mg | ORAL_TABLET | Freq: Every day | ORAL | Status: DC
  Administered 2017-01-06: 25 mg via ORAL
  Filled 2017-01-06: qty 1

## 2017-01-06 MED ORDER — SODIUM CHLORIDE 0.9 % IV SOLN
INTRAVENOUS | Status: DC
  Administered 2017-01-06: 18:00:00 via INTRAVENOUS

## 2017-01-06 MED ORDER — HYDRALAZINE HCL 20 MG/ML IJ SOLN
5.0000 mg | INTRAMUSCULAR | Status: DC | PRN
  Filled 2017-01-06: qty 1

## 2017-01-06 MED ORDER — ONDANSETRON 4 MG PO TBDP: 4.0000 mg | ORAL_TABLET | Freq: Four times a day (QID) | ORAL | Status: DC | PRN

## 2017-01-06 MED ORDER — ONDANSETRON HCL 4 MG/2ML IJ SOLN: 4.0000 mg | Freq: Once | INTRAMUSCULAR | Status: DC | PRN

## 2017-01-06 MED ORDER — BISACODYL 10 MG RE SUPP: 10.0000 mg | Freq: Every day | RECTAL | Status: DC | PRN

## 2017-01-06 MED ORDER — DIBUCAINE 1 % RE OINT
TOPICAL_OINTMENT | RECTAL | Status: DC | PRN
  Administered 2017-01-06: 1 via RECTAL

## 2017-01-06 MED ORDER — BUPIVACAINE-EPINEPHRINE 0.25% -1:200000 IJ SOLN
INTRAMUSCULAR | Status: DC | PRN
  Administered 2017-01-06: 30 mL

## 2017-01-06 MED ORDER — ROCURONIUM BROMIDE 50 MG/5ML IV SOSY
PREFILLED_SYRINGE | INTRAVENOUS | Status: AC
  Filled 2017-01-06: qty 5

## 2017-01-06 MED ORDER — SODIUM CHLORIDE 0.9 % IV SOLN: 500.0000 mL | INTRAVENOUS | Status: DC

## 2017-01-06 MED ORDER — GLYCOPYRROLATE 0.2 MG/ML IJ SOLN
INTRAMUSCULAR | Status: DC | PRN
  Administered 2017-01-06: 0.2 mg via INTRAVENOUS

## 2017-01-06 MED ORDER — ZOLPIDEM TARTRATE 10 MG PO TABS
5.0000 mg | ORAL_TABLET | Freq: Every evening | ORAL | Status: DC | PRN
  Administered 2017-01-06: 5 mg via ORAL
  Filled 2017-01-06: qty 1

## 2017-01-06 MED ORDER — DIBUCAINE 1 % RE OINT
TOPICAL_OINTMENT | RECTAL | Status: AC
  Filled 2017-01-06: qty 28

## 2017-01-06 MED ORDER — BUPIVACAINE LIPOSOME 1.3 % IJ SUSP
20.0000 mL | INTRAMUSCULAR | Status: DC
  Filled 2017-01-06: qty 20

## 2017-01-06 MED ORDER — LIDOCAINE 2% (20 MG/ML) 5 ML SYRINGE
INTRAMUSCULAR | Status: DC | PRN
Start: 2017-01-06 — End: 2017-01-06
  Administered 2017-01-06: 1.5 mg/kg/h via INTRAVENOUS

## 2017-01-06 MED ORDER — ONDANSETRON HCL 4 MG/2ML IJ SOLN
INTRAMUSCULAR | Status: DC | PRN
Start: 2017-01-06 — End: 2017-01-06
  Administered 2017-01-06: 4 mg via INTRAVENOUS

## 2017-01-06 MED ORDER — FENTANYL CITRATE (PF) 250 MCG/5ML IJ SOLN
INTRAMUSCULAR | Status: AC
  Filled 2017-01-06: qty 5

## 2017-01-06 MED ORDER — LACTATED RINGERS IV SOLN
INTRAVENOUS | Status: DC | PRN
  Administered 2017-01-06 (×2): via INTRAVENOUS

## 2017-01-06 MED ORDER — VITAMIN D 1000 UNITS PO TABS
2000.0000 [IU] | ORAL_TABLET | Freq: Every day | ORAL | Status: DC
  Administered 2017-01-07: 2000 [IU] via ORAL
  Filled 2017-01-06: qty 2

## 2017-01-06 MED ORDER — ROCURONIUM BROMIDE 50 MG/5ML IV SOSY
PREFILLED_SYRINGE | INTRAVENOUS | Status: DC | PRN
  Administered 2017-01-06: 50 mg via INTRAVENOUS

## 2017-01-06 MED ORDER — BUPIVACAINE-EPINEPHRINE (PF) 0.25% -1:200000 IJ SOLN
INTRAMUSCULAR | Status: AC
  Filled 2017-01-06: qty 30

## 2017-01-06 MED ORDER — HYDROCORTISONE ACE-PRAMOXINE 2.5-1 % RE CREA: 1.0000 "application " | TOPICAL_CREAM | Freq: Four times a day (QID) | RECTAL | Status: DC | PRN

## 2017-01-06 MED ORDER — KETAMINE HCL 10 MG/ML IJ SOLN
INTRAMUSCULAR | Status: DC | PRN
Start: 2017-01-06 — End: 2017-01-06
  Administered 2017-01-06: 38 mg via INTRAVENOUS

## 2017-01-06 MED ORDER — POLYETHYLENE GLYCOL 3350 17 G PO PACK: 17.0000 g | PACK | Freq: Every day | ORAL | Status: DC | PRN

## 2017-01-06 MED ORDER — ONDANSETRON HCL 4 MG/2ML IJ SOLN
INTRAMUSCULAR | Status: AC
  Filled 2017-01-06: qty 2

## 2017-01-06 MED ORDER — LATANOPROST 0.005 % OP SOLN
1.0000 [drp] | Freq: Every day | OPHTHALMIC | Status: DC
  Administered 2017-01-06: 1 [drp] via OPHTHALMIC
  Filled 2017-01-06: qty 2.5

## 2017-01-06 MED ORDER — ASPIRIN EC 81 MG PO TBEC
81.0000 mg | DELAYED_RELEASE_TABLET | Freq: Every day | ORAL | Status: DC
  Administered 2017-01-07: 81 mg via ORAL
  Filled 2017-01-06: qty 1

## 2017-01-06 MED ORDER — PHENYLEPHRINE 40 MCG/ML (10ML) SYRINGE FOR IV PUSH (FOR BLOOD PRESSURE SUPPORT)
PREFILLED_SYRINGE | INTRAVENOUS | Status: AC
  Filled 2017-01-06: qty 10

## 2017-01-06 MED ORDER — ENOXAPARIN SODIUM 40 MG/0.4ML ~~LOC~~ SOLN
40.0000 mg | SUBCUTANEOUS | Status: DC
  Administered 2017-01-07: 40 mg via SUBCUTANEOUS
  Filled 2017-01-06: qty 0.4

## 2017-01-06 MED ORDER — EPHEDRINE 5 MG/ML INJ
INTRAVENOUS | Status: AC
  Filled 2017-01-06: qty 10

## 2017-01-06 MED ORDER — CEFOTETAN DISODIUM-DEXTROSE 2-2.08 GM-% IV SOLR
2.0000 g | INTRAVENOUS | Status: AC
  Administered 2017-01-06: 2 g via INTRAVENOUS
  Filled 2017-01-06: qty 50

## 2017-01-06 MED ORDER — SUCCINYLCHOLINE CHLORIDE 200 MG/10ML IV SOSY
PREFILLED_SYRINGE | INTRAVENOUS | Status: AC
  Filled 2017-01-06: qty 10

## 2017-01-06 MED ORDER — TAMSULOSIN HCL 0.4 MG PO CAPS: 0.4000 mg | ORAL_CAPSULE | Freq: Every day | ORAL | Status: DC

## 2017-01-06 MED ORDER — SUCCINYLCHOLINE CHLORIDE 200 MG/10ML IV SOSY
PREFILLED_SYRINGE | INTRAVENOUS | Status: DC | PRN
  Administered 2017-01-06: 100 mg via INTRAVENOUS

## 2017-01-06 MED ORDER — ALUM & MAG HYDROXIDE-SIMETH 200-200-20 MG/5ML PO SUSP: 30.0000 mL | Freq: Four times a day (QID) | ORAL | Status: DC | PRN

## 2017-01-06 MED ORDER — OXYCODONE HCL 5 MG PO TABS: 5.0000 mg | ORAL_TABLET | ORAL | Status: DC | PRN

## 2017-01-06 MED ORDER — 0.9 % SODIUM CHLORIDE (POUR BTL) OPTIME
TOPICAL | Status: DC | PRN
  Administered 2017-01-06: 2000 mL

## 2017-01-06 MED ORDER — SODIUM CHLORIDE 0.9 % IV SOLN: 250.0000 mL | INTRAVENOUS | Status: DC | PRN

## 2017-01-06 MED ORDER — LIDOCAINE 2% (20 MG/ML) 5 ML SYRINGE
INTRAMUSCULAR | Status: AC
  Filled 2017-01-06: qty 10

## 2017-01-06 MED ORDER — SIMVASTATIN 40 MG PO TABS: 40.0000 mg | ORAL_TABLET | Freq: Every evening | ORAL | Status: DC

## 2017-01-06 MED ORDER — SODIUM CHLORIDE 0.9% FLUSH
3.0000 mL | Freq: Two times a day (BID) | INTRAVENOUS | Status: DC
  Administered 2017-01-06: 3 mL via INTRAVENOUS

## 2017-01-06 MED ORDER — HYDROMORPHONE HCL 2 MG/ML IJ SOLN: 0.2500 mg | INTRAMUSCULAR | Status: DC | PRN

## 2017-01-06 MED ORDER — GLYCOPYRROLATE 0.2 MG/ML IV SOSY
PREFILLED_SYRINGE | INTRAVENOUS | Status: AC
  Filled 2017-01-06: qty 5

## 2017-01-06 MED ORDER — DEXAMETHASONE SODIUM PHOSPHATE 10 MG/ML IJ SOLN
INTRAMUSCULAR | Status: DC | PRN
  Administered 2017-01-06: 7 mg via INTRAVENOUS

## 2017-01-06 MED ORDER — MENTHOL 3 MG MT LOZG: 1.0000 | LOZENGE | OROMUCOSAL | Status: DC | PRN

## 2017-01-06 MED ORDER — SUGAMMADEX SODIUM 200 MG/2ML IV SOLN
INTRAVENOUS | Status: AC
  Filled 2017-01-06: qty 2

## 2017-01-06 MED ORDER — LACTATED RINGERS IV SOLN: 1000.0000 mL | Freq: Three times a day (TID) | INTRAVENOUS | Status: DC | PRN

## 2017-01-06 MED ORDER — SIMETHICONE 80 MG PO CHEW: 40.0000 mg | CHEWABLE_TABLET | Freq: Four times a day (QID) | ORAL | Status: DC | PRN

## 2017-01-06 MED ORDER — HYDROMORPHONE HCL 1 MG/ML IJ SOLN: 0.5000 mg | INTRAMUSCULAR | Status: DC | PRN

## 2017-01-06 MED ORDER — MAGIC MOUTHWASH
15.0000 mL | Freq: Four times a day (QID) | ORAL | Status: DC | PRN
  Filled 2017-01-06: qty 15

## 2017-01-06 MED ORDER — ENSURE SURGERY PO LIQD
237.0000 mL | Freq: Two times a day (BID) | ORAL | Status: DC
  Filled 2017-01-06 (×3): qty 237

## 2017-01-06 MED ORDER — LIDOCAINE 2% (20 MG/ML) 5 ML SYRINGE
INTRAMUSCULAR | Status: DC | PRN
  Administered 2017-01-06: 100 mg via INTRAVENOUS

## 2017-01-06 MED ORDER — DIPHENHYDRAMINE HCL 50 MG/ML IJ SOLN: 12.5000 mg | Freq: Four times a day (QID) | INTRAMUSCULAR | Status: DC | PRN

## 2017-01-06 MED ORDER — PROCHLORPERAZINE EDISYLATE 5 MG/ML IJ SOLN: 5.0000 mg | INTRAMUSCULAR | Status: DC | PRN

## 2017-01-06 MED ORDER — MEPERIDINE HCL 50 MG/ML IJ SOLN: 6.2500 mg | INTRAMUSCULAR | Status: DC | PRN

## 2017-01-06 MED ORDER — ENOXAPARIN SODIUM 40 MG/0.4ML ~~LOC~~ SOLN
40.0000 mg | Freq: Once | SUBCUTANEOUS | Status: AC
  Administered 2017-01-06: 40 mg via SUBCUTANEOUS
  Filled 2017-01-06: qty 0.4

## 2017-01-06 MED ORDER — DEXAMETHASONE SODIUM PHOSPHATE 10 MG/ML IJ SOLN
INTRAMUSCULAR | Status: AC
  Filled 2017-01-06: qty 1

## 2017-01-06 MED ORDER — OXYCODONE HCL 5 MG PO TABS: 5.0000 mg | ORAL_TABLET | ORAL | 0 refills | Status: DC | PRN

## 2017-01-06 MED ORDER — FENTANYL CITRATE (PF) 250 MCG/5ML IJ SOLN
INTRAMUSCULAR | Status: DC | PRN
  Administered 2017-01-06 (×2): 50 ug via INTRAVENOUS

## 2017-01-06 MED ORDER — METFORMIN HCL 500 MG PO TABS
500.0000 mg | ORAL_TABLET | Freq: Two times a day (BID) | ORAL | Status: DC
  Administered 2017-01-07: 500 mg via ORAL
  Filled 2017-01-06: qty 1

## 2017-01-06 MED ORDER — HYDROCORTISONE 1 % EX CREA: 1.0000 "application " | TOPICAL_CREAM | Freq: Three times a day (TID) | CUTANEOUS | Status: DC | PRN

## 2017-01-06 MED ORDER — WITCH HAZEL-GLYCERIN EX PADS: 1.0000 "application " | MEDICATED_PAD | CUTANEOUS | Status: DC | PRN

## 2017-01-06 MED ORDER — SUGAMMADEX SODIUM 200 MG/2ML IV SOLN
INTRAVENOUS | Status: DC | PRN
  Administered 2017-01-06: 200 mg via INTRAVENOUS

## 2017-01-06 MED ORDER — HYDROCORTISONE 2.5 % RE CREA: 1.0000 "application " | TOPICAL_CREAM | Freq: Four times a day (QID) | RECTAL | Status: DC | PRN

## 2017-01-06 MED ORDER — BUPIVACAINE LIPOSOME 1.3 % IJ SUSP
INTRAMUSCULAR | Status: DC | PRN
  Administered 2017-01-06: 20 mL

## 2017-01-06 MED ORDER — GUAIFENESIN-DM 100-10 MG/5ML PO SYRP: 10.0000 mL | ORAL_SOLUTION | ORAL | Status: DC | PRN

## 2017-01-06 MED ORDER — INSULIN ASPART 100 UNIT/ML ~~LOC~~ SOLN: 0.0000 [IU] | Freq: Three times a day (TID) | SUBCUTANEOUS | Status: DC

## 2017-01-06 MED ORDER — PROPOFOL 10 MG/ML IV BOLUS
INTRAVENOUS | Status: AC
Start: 2017-01-06 — End: 2017-01-06
  Filled 2017-01-06: qty 20

## 2017-01-06 MED ORDER — TAMSULOSIN HCL 0.4 MG PO CAPS: 0.4000 mg | ORAL_CAPSULE | Freq: Every day | ORAL | 1 refills | Status: AC

## 2017-01-06 MED ORDER — PREDNISOLONE ACETATE 1 % OP SUSP: 1.0000 [drp] | Freq: Every day | OPHTHALMIC | Status: DC | PRN

## 2017-01-06 MED ORDER — EPHEDRINE SULFATE 50 MG/ML IJ SOLN
INTRAMUSCULAR | Status: DC | PRN
  Administered 2017-01-06 (×3): 10 mg via INTRAVENOUS

## 2017-01-06 MED ORDER — LIDOCAINE 2% (20 MG/ML) 5 ML SYRINGE
INTRAMUSCULAR | Status: AC
  Filled 2017-01-06: qty 5

## 2017-01-06 MED ORDER — INSULIN ASPART 100 UNIT/ML ~~LOC~~ SOLN: 0.0000 [IU] | Freq: Every day | SUBCUTANEOUS | Status: DC

## 2017-01-06 MED ORDER — GABAPENTIN 300 MG PO CAPS
300.0000 mg | ORAL_CAPSULE | ORAL | Status: AC
  Administered 2017-01-06: 300 mg via ORAL
  Filled 2017-01-06: qty 1

## 2017-01-06 MED ORDER — PHENOL 1.4 % MT LIQD: 1.0000 | OROMUCOSAL | Status: DC | PRN

## 2017-01-06 MED ORDER — PHENYLEPHRINE HCL 10 MG/ML IJ SOLN
INTRAMUSCULAR | Status: DC | PRN
  Administered 2017-01-06: 80 ug via INTRAVENOUS

## 2017-01-06 SURGICAL SUPPLY — 50 items
BLADE SURG 15 STRL LF DISP TIS (BLADE) IMPLANT
BLADE SURG 15 STRL SS (BLADE)
BRIEF STRETCH FOR OB PAD LRG (UNDERPADS AND DIAPERS) IMPLANT
CABLE HIGH FREQUENCY MONO STRZ (ELECTRODE) ×3 IMPLANT
DRAPE LAPAROTOMY T 102X78X121 (DRAPES) IMPLANT
DRAPE SURG IRRIG POUCH 19X23 (DRAPES) ×3 IMPLANT
DRAPE WARM FLUID 44X44 (DRAPE) ×3 IMPLANT
DRSG PAD ABDOMINAL 8X10 ST (GAUZE/BANDAGES/DRESSINGS) IMPLANT
ELECT PENCIL ROCKER SW 15FT (MISCELLANEOUS) IMPLANT
ELECT REM PT RETURN 15FT ADLT (MISCELLANEOUS) ×3 IMPLANT
GAUZE SPONGE 4X4 12PLY STRL (GAUZE/BANDAGES/DRESSINGS) ×3 IMPLANT
GAUZE SPONGE 4X4 16PLY XRAY LF (GAUZE/BANDAGES/DRESSINGS) ×3 IMPLANT
GLOVE ECLIPSE 8.0 STRL XLNG CF (GLOVE) ×6 IMPLANT
GLOVE INDICATOR 8.0 STRL GRN (GLOVE) ×6 IMPLANT
GOWN STRL REUS W/TWL XL LVL3 (GOWN DISPOSABLE) ×9 IMPLANT
KIT BASIN OR (CUSTOM PROCEDURE TRAY) ×3 IMPLANT
LEGGING LITHOTOMY PAIR STRL (DRAPES) ×3 IMPLANT
LUBRICANT JELLY K Y 4OZ (MISCELLANEOUS) ×3 IMPLANT
NEEDLE HYPO 22GX1.5 SAFETY (NEEDLE) ×3 IMPLANT
PACK BASIC VI WITH GOWN DISP (CUSTOM PROCEDURE TRAY) ×3 IMPLANT
PAD POSITIONING PINK XL (MISCELLANEOUS) ×3 IMPLANT
RETRACTOR LONE STAR DISPOSABLE (INSTRUMENTS) ×3 IMPLANT
RETRACTOR STAY HOOK 5MM (MISCELLANEOUS) ×3 IMPLANT
SCISSORS LAP 5X35 DISP (ENDOMECHANICALS) ×3 IMPLANT
SET IRRIG TUBING LAPAROSCOPIC (IRRIGATION / IRRIGATOR) ×3 IMPLANT
SHEARS HARMONIC ACE PLUS 36CM (ENDOMECHANICALS) ×3 IMPLANT
STOPCOCK 4 WAY LG BORE MALE ST (IV SETS) IMPLANT
SUT CHROMIC 3 0 SH 27 (SUTURE) IMPLANT
SUT PDS AB 2-0 CT2 27 (SUTURE) IMPLANT
SUT PDS AB 3-0 SH 27 (SUTURE) IMPLANT
SUT SILK 2 0 (SUTURE)
SUT SILK 2 0 SH CR/8 (SUTURE) IMPLANT
SUT SILK 2-0 18XBRD TIE 12 (SUTURE) IMPLANT
SUT SILK 3 0 SH 30 (SUTURE) IMPLANT
SUT SILK 3 0 SH CR/8 (SUTURE) IMPLANT
SUT V-LOC BARB 180 2/0GR6 GS22 (SUTURE)
SUT VIC AB 2-0 SH 18 (SUTURE) ×3 IMPLANT
SUT VIC AB 2-0 UR6 27 (SUTURE) ×18 IMPLANT
SUT VIC AB 3-0 SH 27 (SUTURE)
SUT VIC AB 3-0 SH 27XBRD (SUTURE) IMPLANT
SUTURE V-LC BRB 180 2/0GR6GS22 (SUTURE) IMPLANT
SYR 20CC LL (SYRINGE) ×3 IMPLANT
SYR BULB IRRIGATION 50ML (SYRINGE) IMPLANT
TOWEL OR 17X26 10 PK STRL BLUE (TOWEL DISPOSABLE) ×3 IMPLANT
TOWEL OR NON WOVEN STRL DISP B (DISPOSABLE) ×3 IMPLANT
TRAY FOLEY W/METER SILVER 16FR (SET/KITS/TRAYS/PACK) ×3 IMPLANT
TUBING CONNECTING 10 (TUBING) IMPLANT
TUBING CONNECTING 10' (TUBING)
TUBING INSUF HEATED (TUBING) ×3 IMPLANT
YANKAUER SUCT BULB TIP 10FT TU (MISCELLANEOUS) IMPLANT

## 2017-01-06 NOTE — Transfer of Care (Signed)
Immediate Anesthesia Transfer of Care Note  Patient: Brendan Meyer  Procedure(s) Performed: Procedure(s) with comments: PARTIAL PROCTECTOMY OF EARLY RECTAL CANCER, TRANSANAL ENDOSCOPIC MICROSURGERY  ERAS PATHWAY (N/A) - ERAS PATHWAY EXAM UNDER ANESTHESIA (N/A)  Patient Location: PACU  Anesthesia Type:General  Level of Consciousness:  sedated, patient cooperative and responds to stimulation  Airway & Oxygen Therapy:Patient Spontanous Breathing and Patient connected to face mask oxgen  Post-op Assessment:  Report given to PACU RN and Post -op Vital signs reviewed and stable  Post vital signs:  Reviewed and stable  Last Vitals:  Vitals:   01/06/17 0914  BP: 139/68  Pulse: 84  Resp: 18  Temp: 77.8 C    Complications: No apparent anesthesia complications

## 2017-01-06 NOTE — Interval H&P Note (Signed)
History and Physical Interval Note:  01/06/2017 12:17 PM  Brendan Meyer  has presented today for surgery, with the diagnosis of T1 rectal cancer  The various methods of treatment have been discussed with the patient and family. After consideration of risks, benefits and other options for treatment, the patient has consented to  Procedure(s) with comments: PARTIAL PROCTECTOMY TO REMOVE PATHOLOGY USING TRANSANAL ENDOSCOPIC MICROSURGERY  ERAS PATHWAY (N/A) - ERAS PATHWAY EXAM UNDER ANESTHESIA (N/A) as a surgical intervention .  The patient's history has been reviewed, patient examined, no change in status, stable for surgery.  I have reviewed the patient's chart and labs.  Questions were answered to the patient's satisfaction.     Sheriann Newmann C.

## 2017-01-06 NOTE — H&P (View-Only) (Signed)
Brendan Meyer 12/29/2016 11:38 AM Location: [facility] Patient #: [id number] DOB: [birth year] Married / Language: English / Race: Refused to Report/Unreported Male  History of Present Illness Brendan Meyer; 12/29/2016 12:45 PM) The patient is a 82 year old male who presents with colorectal cancer. Note for "Colorectal cancer": ` ` ` Patient sent for surgical consultation at the request of the patient. Second opinion.  Chief Complaint: Very low rectal cancer. Wishes to avoid abdominal perineal resection.  The patient is a 82 year old elderly male found to have a very distal rectal mass. Biopsy consistent with adenocarcinoma. Surgical consultation requested. Dr. [name] saw. She was concerned that may require abdominoperineal resection to remove if T3/T4 but most likely would do transanal resection first given did not being particularly large and patient's advanced age. She did give options, but patient apparently heard APR as only option. There have been a lot of deaths by family members and close friends. They were terrified of that option. Therefore, they wanted to get a second opinion.   Since he saw Dr. [name], he underwent endorectal ultrasound. Staged as uT1uN0 1.8cm cancer right posterior rectal wall. 5 mm from anal verge. CT scan showed no evidence of any distant metastatic disease. Discussed the GI tumor Board. Patient requested second opinion to see transanal excision an option.  (Review of systems as stated in this history (HPI) or in the review of systems. Otherwise all other 12 point ROS are negative)   Allergies Brendan Meyer, CMA; 12/29/2016 11:39 AM) No Known Allergies 12/16/2016 Allergies Reconciled  Medication History Brendan Meyer, CMA; 12/29/2016 11:40 AM) Zolpidem Tartrate (10MG  Tablet, Oral) Active. Latanoprost (0.005% Solution, Ophthalmic) Active. MetFORMIN HCl (500MG  Tablet, Oral) Active. Metoprolol Tartrate (25MG   Tablet, Oral) Active. PrednisoLONE Acetate (1% Suspension, Ophthalmic) Active. Simvastatin (40MG  Tablet, Oral) Active. Aspirin (81MG  Tablet DR, Oral) Active. Vitamin D (2000UNIT Tablet, Oral) Active. Medications Reconciled    Vitals Brendan Meyer CMA; 12/29/2016 11:41 AM) 12/29/2016 11:40 AM Weight: 161.8 lb Height: 71in Body Surface Area: 1.93 m Body Mass Index: 22.57 kg/m  Temp.: 97.59F  Pulse: 75 (Regular)  BP: 130/74 (Sitting, Left Arm, Standard)      Physical Exam Brendan Meyer; 12/29/2016 12:27 PM)  General Mental Status-Alert. General Appearance-Not in acute distress, Not Sickly. Orientation-Oriented X3. Hydration-Well hydrated. Voice-Normal.  Integumentary Global Assessment Upon inspection and palpation of skin surfaces of the - Axillae: non-tender, no inflammation or ulceration, no drainage. and Distribution of scalp and body hair is normal. General Characteristics Temperature - normal warmth is noted.  Head and Neck Head-normocephalic, atraumatic with no lesions or palpable masses. Face Global Assessment - atraumatic, no absence of expression. Neck Global Assessment - no abnormal movements, no bruit auscultated on the right, no bruit auscultated on the left, no decreased range of motion, non-tender. Trachea-midline. Thyroid Gland Characteristics - non-tender.  Eye Eyeball - Left-Extraocular movements intact, No Nystagmus. Eyeball - Right-Extraocular movements intact, No Nystagmus. Cornea - Left-No Hazy. Cornea - Right-No Hazy. Sclera/Conjunctiva - Left-No scleral icterus, No Discharge. Sclera/Conjunctiva - Right-No scleral icterus, No Discharge. Pupil - Left-Direct reaction to light normal. Pupil - Right-Direct reaction to light normal.  ENMT Ears Pinna - Left - no drainage observed, no generalized tenderness observed. Right - no drainage observed, no generalized tenderness observed. Nose and  Sinuses External Inspection of the Nose - no destructive lesion observed. Inspection of the nares - Left - quiet respiration. Right - quiet respiration. Mouth and Throat Lips - Upper Lip - no fissures  observed, no pallor noted. Lower Lip - no fissures observed, no pallor noted. Nasopharynx - no discharge present. Oral Cavity/Oropharynx - Tongue - no dryness observed. Oral Mucosa - no cyanosis observed. Hypopharynx - no evidence of airway distress observed.  Chest and Lung Exam Inspection Movements - Normal and Symmetrical. Accessory muscles - No use of accessory muscles in breathing. Palpation Palpation of the chest reveals - Non-tender. Auscultation Breath sounds - Normal and Clear.  Cardiovascular Auscultation Rhythm - Regular. Murmurs & Other Heart Sounds - Auscultation of the heart reveals - No Murmurs and No Systolic Clicks.  Abdomen Inspection Inspection of the abdomen reveals - No Visible peristalsis and No Abnormal pulsations. Umbilicus - No Bleeding, No Urine drainage. Palpation/Percussion Palpation and Percussion of the abdomen reveal - Soft, Non Tender, No Rebound tenderness, No Rigidity (guarding) and No Cutaneous hyperesthesia. Note: Abdomen soft. Nontender, nondistended. No guarding. No diastasis. 5 mm umbilical hernia on cough only. Reducible. No other hernias  Male Genitourinary Sexual Maturity Tanner 5 - Adult hair pattern and Adult penile size and shape. Note: No inguinal hernias. Normal external genitalia. Epididymi, testes, and spermatic cords normal without any masses.  Rectal Note: Fungating mobile mass right posterior rectal wall. 7:00. 2 cm wide by 3 cm long. The distal aspect comes right down to the sphincters. Given friable. Mildly enlarged left lateral and right anterior internal hemorrhoids but no thrombosis.  Perianal skin clean with good hygiene. No pruritis ani. No pilonidal disease. No fissure. No abscess/fistula. Normal sphincter tone. No external  hemorrhoids. No condyloma warts. Tolerates digital rectal exam. No other rectal masses. Prostate smooth and not enlarged.  Peripheral Vascular Upper Extremity Inspection - Left - No Cyanotic nailbeds, Not Ischemic. Right - No Cyanotic nailbeds, Not Ischemic.  Neurologic Neurologic evaluation reveals -normal attention span and ability to concentrate, able to name objects and repeat phrases. Appropriate fund of knowledge , normal sensation and normal coordination. Mental Status Affect - not angry, not paranoid. Cranial Nerves-Normal Bilaterally. Gait-Normal.  Neuropsychiatric Mental status exam performed with findings of-able to articulate well with normal speech/language, rate, volume and coherence, thought content normal with ability to perform basic computations and apply abstract reasoning and no evidence of hallucinations, delusions, obsessions or homicidal/suicidal ideation.  Musculoskeletal Global Assessment Spine, Ribs and Pelvis - no instability, subluxation or laxity. Right Upper Extremity - no instability, subluxation or laxity.  Lymphatic Head & Neck  General Head & Neck Lymphatics: Bilateral - Description - No Localized lymphadenopathy. Axillary  General Axillary Region: Bilateral - Description - No Localized lymphadenopathy. Femoral & Inguinal  Generalized Femoral & Inguinal Lymphatics: Left - Description - No Localized lymphadenopathy. Right - Description - No Localized lymphadenopathy.    Assessment & Plan Brendan Meyer; 12/29/2016 12:48 PM)  RECTAL CANCER (C20) Impression: 82 year old active male with T1 rectal cancer. Very distal.  Standard of care would be segmental colonic resection of draining lymph nodes. I'm concerned that he would be hard to get decent margins to do sphincter sparing surgery and therefore abdominal  Surgery with colostomy.  However, because this is a T1N0 cancer without any distant disease, and is reasonable to do an  aggressive TEM partial proctectomy transanal resection. There is no good data for neoadjuvant chemoradiation therapy preoperatively. Regroup after margins with a more aggressive survivable pathway.  He would like to try transanal resection first and is willing to have a closer survival pathway in the hopes that he can still have cure without having a permanent colostomy. He has  excellent exercise tolerance. I did caution him that should he have evidence of recurrence, I would be adamant that he will require abdominal perineal resection. I understand that he does not want a permanent colostomy, I noted that after 6-12 months he will come to terms with that and me glad that he is not dead. Hopefully he will not get recurrence and will not come to that.  Current Plans Pt Education - [facility] TEM Education (Ingrid Shifrin): discussed with patient and provided information. PREOP COLON - ENCOUNTER FOR PREOPERATIVE EXAMINATION FOR GENERAL SURGICAL PROCEDURE (Z01.818)  Current Plans You are being scheduled for surgery- Our schedulers will call you.  You should hear from our office's scheduling department within 5 working days about the location, date, and time of surgery. We try to make accommodations for patient's preferences in scheduling surgery, but sometimes the OR schedule or the surgeon's schedule prevents [place] from making those accommodations.  If you have not heard from our office [phone]) in 5 working days, call the office and ask for your surgeon's nurse.  If you have other questions about your diagnosis, plan, or surgery, call the office and ask for your surgeon's nurse.  Written instructions provided Pt Education - Pamphlet Given - Laparoscopic Colorectal Surgery: discussed with patient and provided information. Pt Education - [facility] Colon Bowel Prep 2015 Miralax/Antibiotics Started Flagyl 500MG , 2 (two) Tablet SEE NOTE, #6, 12/29/2016, No Refill. Local Order: Take at 2pm, 3pm, and 10pm the day  prior to your colon operation Started Neomycin Sulfate 500MG , 2 (two) Tablet SEE NOTE, #6, 12/29/2016, No Refill. Local Order: TAKE TWO TABLETS AT 2 PM, 3 PM, AND 10 PM THE DAY PRIOR TO SURGERY

## 2017-01-06 NOTE — Anesthesia Postprocedure Evaluation (Signed)
Anesthesia Post Note  Patient: Brendan Meyer  Procedure(s) Performed: Procedure(s) (LRB): PARTIAL PROCTECTOMY OF EARLY RECTAL CANCER, TRANSANAL ENDOSCOPIC MICROSURGERY  ERAS PATHWAY (N/A) EXAM UNDER ANESTHESIA (N/A)     Patient location during evaluation: PACU Anesthesia Type: General Level of consciousness: awake and alert Pain management: pain level controlled Vital Signs Assessment: post-procedure vital signs reviewed and stable Respiratory status: spontaneous breathing, nonlabored ventilation, respiratory function stable and patient connected to nasal cannula oxygen Cardiovascular status: blood pressure returned to baseline and stable Postop Assessment: no signs of nausea or vomiting Anesthetic complications: no    Last Vitals:  Vitals:   01/06/17 1606 01/06/17 1610  BP: (!) 143/64 (!) 143/64  Pulse: 72 72  Resp: 16 16  Temp: 36.7 C 36.7 C    Last Pain:  Vitals:   01/06/17 1610  TempSrc: Oral  PainSc: 0-No pain                 [name] DAVID

## 2017-01-06 NOTE — Op Note (Signed)
01/06/2017  2:38 PM  PATIENT:  Brendan Meyer  82 y.o. male  Patient Care Team: Reynold Bowen, MD as PCP - General (Endocrinology) Lenoard Aden, RN as Registered Nurse Ardis Hughs Melene Plan, MD as Consulting Physician (Gastroenterology)  PRE-OPERATIVE DIAGNOSIS:  T1 rectal cancer  POST-OPERATIVE DIAGNOSIS:  T1 very distal rectal cancer  PROCEDURE:   PARTIAL PROCTECTOMY OF EARLY RECTAL CANCER,  TRANSANAL ENDOSCOPIC MICROSURGERY   EXAM UNDER ANESTHESIA  SURGEON:  [name], MD  ASSISTANT: RN   ANESTHESIA:   local and general  EBL:  Total I/O In: 1000 [I.V.:1000] Out: 70 [Urine:250; Blood:10]  Delay start of Pharmacological VTE agent (>24hrs) due to surgical blood loss or risk of bleeding:  no  DRAINS: none   SPECIMEN:  Source of Specimen:  Posterior rectal wall mass.  See operative findings for details.  DISPOSITION OF SPECIMEN:  PATHOLOGY  COUNTS:  YES  PLAN OF CARE: Admit for overnight observation  PATIENT DISPOSITION:  PACU - hemodynamically stable.  INDICATION: Pleasant patient with worsening rectal bleeding and pain.  Found to have very distal rectal cancer.  Options discussed.  Ultrasound staged as T1 N0.  Patient declined segmental resection.  Wished to have local excision and close follow-up.  Issues with defecation and urination discussed in detail.  Patient understands local recurrence rate higher and risk of failure requiring abdominoperineal resection.  Questions answered patient agreed to proceed.  The anatomy & physiology of the digestive tract was discussed.  The pathophysiology of the rectal pathology was discussed.  Natural history risks without surgery was discussed.   I feel the risks of no intervention will lead to serious problems that outweigh the operative risks; therefore, I recommended surgery.    Laparoscopic & open abdominal techniques were discussed.  I recommended we start with a partial proctectomy by transanal endoscopic  microsurgery (TEM) for excisional biopsy to remove the pathology and hopefully cure and/or control the pathology.  This technique can offer less operative risk and faster post-operative recovery.  Possible need for immediate or later abdominal surgery for further treatment was discussed.   Risks such as bleeding, abscess, reoperation, ostomy, heart attack, death, and other risks were discussed.   I noted a good likelihood this will help address the problem.  Goals of post-operative recovery were discussed as well.  We will work to minimize complications.  An educational handout was given as well.  Questions were answered.  The patient expresses understanding & wishes to proceed with surgery.  OR FINDINGS: Distal fungating 4 x 3 cm mass with 2 x 2 centimeter base.  Anorectal junction are posterior.   Pin placement on pathology specimen: Proximal margin: Red Distal margin: Blue Right Lateral margin: White Left lateral margin: Black  The closure rests 0cm from the anal verge in the posterior 40% circumference location  DESCRIPTION: Informed consent was confirmed.  Patient received general anesthesia without difficulty.  Foley catheter sterilely placed.  Sequential compression devices active during the entire case.  The patient was placed in the lithotomy position, taking extra care to secure and protect the patient appropriately.  The perineum and perianal regions were prepped and draped in a sterile fashion.  Surgical timeout confirmed our plan.  I did a gentle digital rectal examination with gradual anal dilation to allow placement of the 4 cm TEO Stortz scope.  This was secured to the bed using the TEO clamping system.  We induced carbon dioxide insufflation intraluminally.  I oriented the Stortz scope and the  patient such that the specimen rested towards the floor at the 6:00 position.  I could easily identify the mass.  I went ahead and used tip point cautery to mark 1 cm margins  circumferentially, starting distally at the anal verge.  I then did a full thickness transection at the distal margin.  I came over the sphincter complex to spare it.  I came around laterally.  Switched over to harmonic dissection.  Lifted the specimen off the pelvic canal for a good deep margin.  I gradually came more proximally to help mobilize the rectum posteriorly and help bring it down to the anal verge better..  Transected at the proximal margin.  I ensured hemostasis.  I inspected the main specimen and pinned it on thick cork board.  Pins as noted above.  I walked the specimen down to pathology and showed the [facility] pathology team for proper orientation.    I went back in and scrubbed in.  Hemostasis excellent.  I reapproximated the wound with a 2-0 Vicryl horizontal mattress interrupted sutures at the corners and at the middle part of the rectum down to help close the wound down.  I continued interrupted horizontal mattress sutures until the wound was fully closed down in a nice curve transversely.  About 40% of the posterior circumference.  This brought things together well.  I did meticulous inspection with fine tip instruments to confirm good watertight closure   Hemostasis excellent.  The lumen was quite patent.  Carbon dioxide evacuated & instruments removed.  The patient is being extubated go to recovery room.  I discussed operative findings, updated the patient's status, discussed probable steps to recovery, and gave postoperative recommendations to the patient's spouse.  Recommendations were made.  Questions were answered.  She expressed understanding & appreciation.  [name], M.D., F.A.C.S. Gastrointestinal and Minimally Invasive Surgery [facility] [address], [place], [place] [address] [phone] Main / Paging

## 2017-01-06 NOTE — Anesthesia Procedure Notes (Signed)
Procedure Name: Intubation Date/Time: 01/06/2017 12:38 PM Performed by: Maxwell Caul Pre-anesthesia Checklist: Patient identified, Emergency Drugs available, Suction available and Patient being monitored Patient Re-evaluated:Patient Re-evaluated prior to inductionOxygen Delivery Method: Circle system utilized Preoxygenation: Pre-oxygenation with 100% oxygen Intubation Type: IV induction Ventilation: Mask ventilation without difficulty Laryngoscope Size: Mac and 4 Grade View: Grade I Tube type: Oral Tube size: 7.5 mm Number of attempts: 1 Airway Equipment and Method: Stylet Placement Confirmation: ETT inserted through vocal cords under direct vision,  positive ETCO2 and breath sounds checked- equal and bilateral Secured at: 21 cm Tube secured with: Tape Dental Injury: Teeth and Oropharynx as per pre-operative assessment

## 2017-01-06 NOTE — Anesthesia Preprocedure Evaluation (Addendum)
Anesthesia Evaluation  Patient identified by MRN, date of birth, ID band Patient awake    Reviewed: Allergy & Precautions, NPO status , Patient's Chart, lab work & pertinent test results, reviewed documented beta blocker date and time   Airway Mallampati: II  TM Distance: >3 FB Neck ROM: Full    Dental no notable dental hx. (+) Upper Dentures, Lower Dentures   Pulmonary shortness of breath and with exertion,    Pulmonary exam normal breath sounds clear to auscultation       Cardiovascular + angina with exertion + CAD and + Past MI  Normal cardiovascular exam+ dysrhythmias Atrial Fibrillation  Rhythm:Regular Rate:Normal     Neuro/Psych negative neurological ROS  negative psych ROS   GI/Hepatic GERD  Medicated and Controlled,Rectal Ca   Endo/Other  diabetes, Well Controlled, Type 2Hyperlipidemia  Renal/GU Renal diseaseHx/o renal calculi     Musculoskeletal negative musculoskeletal ROS (+)   Abdominal   Peds  Hematology negative hematology ROS (+)   Anesthesia Other Findings   Reproductive/Obstetrics                             Anesthesia Physical Anesthesia Plan  ASA: III  Anesthesia Plan: General   Post-op Pain Management:    Induction: Intravenous  PONV Risk Score and Plan:   Airway Management Planned: Oral ETT  Additional Equipment:   Intra-op Plan:   Post-operative Plan: Extubation in OR  Informed Consent: I have reviewed the patients History and Physical, chart, labs and discussed the procedure including the risks, benefits and alternatives for the proposed anesthesia with the patient or authorized representative who has indicated his/her understanding and acceptance.   Dental advisory given  Plan Discussed with: CRNA, Anesthesiologist and Surgeon  Anesthesia Plan Comments:        Anesthesia Quick Evaluation

## 2017-01-07 DIAGNOSIS — C20 Malignant neoplasm of rectum: Secondary | ICD-10-CM | POA: Diagnosis not present

## 2017-01-07 LAB — GLUCOSE, CAPILLARY: Glucose-Capillary: 117 mg/dL — ABNORMAL HIGH (ref 65–99)

## 2017-01-07 NOTE — Discharge Instructions (Signed)
[facility] _______[facility]  RECTAL SURGERY POST OP INSTRUCTIONS: POST OP INSTRUCTIONS  Always review your discharge instruction sheet given to you by the facility where your surgery was performed. IF YOU HAVE DISABILITY OR FAMILY LEAVE FORMS, YOU MUST BRING THEM TO THE OFFICE FOR PROCESSING.   DO NOT GIVE THEM TO YOUR DOCTOR.  1. A  prescription for pain medication may be given to you upon discharge.  Take your pain medication as prescribed, if needed.  If narcotic pain medicine is not needed, then you may take acetaminophen (Tylenol) or ibuprofen (Advil) as needed. 2. Take your usually prescribed medications unless otherwise directed. 3. If you need a refill on your pain medication, please contact your pharmacy.  They will contact our office to request authorization. Prescriptions will not be filled after 5 pm or on week-ends. 4. You should follow a soft diet the first 48 hours after arrival home.  Be sure to include lots of fluids daily.  Resume your normal diet 2-3 days after surgery.. 5. Most patients will experience some swelling and discomfort in the rectal area. Ice packs, reclining and warm tub soaks will help.  Swelling and discomfort can take several days to resolve.  6. It is common to experience some constipation if taking pain medication after surgery.  Increasing fluid intake and taking a stool softener (such as Colace) will usually help or prevent this problem from occurring.  A mild laxative (Milk of Magnesia or Miralax) should be taken according to package directions if there are no bowel movements after 48 hours. 7. Unless discharge instructions indicate otherwise, leave your bandage dry and in place for 24 hours, or remove the bandage if you have a bowel movement. You may notice a small amount of bleeding with bowel movements for the first few days. You may have some packing in the rectum which will come out over the first day or two. You will need to wear an absorbent  pad or soft cotton gauze in your underwear until the drainage stops.it. 8. ACTIVITIES:  You may resume regular (light) daily activities beginning the next day--such as daily self-care, walking, climbing stairs--gradually increasing activities as tolerated.  You may have sexual intercourse when it is comfortable.  Refrain from any heavy lifting or straining until approved by your doctor. a. You may drive when you are no longer taking prescription pain medication, you can comfortably wear a seatbelt, and you can safely maneuver your car and apply brakes. b. RETURN TO WORK: : ____________________ c.  9. You should see your doctor in the office for a follow-up appointment approximately 2-3 weeks after your surgery.  Make sure that you call for this appointment within a day or two after you arrive home to insure a convenient appointment time. 10. OTHER INSTRUCTIONS:  __________________________________________________________________________________________________________________________________________________________________________________________  WHEN TO CALL YOUR DOCTOR: 1. Fever over 101.0 2. Inability to urinate 3. Nausea and/or vomiting 4. Extreme swelling or bruising 5. Continued bleeding from rectum. 6. Increased pain, redness, or drainage from the incision 7. Constipation  The clinic staff is available to answer your questions during regular business hours.  Please dont hesitate to call and ask to speak to one of the nurses for clinical concerns.  If you have a medical emergency, go to the nearest emergency room or call 911.  A surgeon from [facility] is always on call at the hospital   [address] [place], [place], [place]  [address] ?  [id number], [place],  [place]   [address] [phone] ? [phone] ? FAX [phone] Web site: [email or web address]

## 2017-01-07 NOTE — Progress Notes (Signed)
Assessment Principal Problem:   Rectal cancer uT1uN0 s/p TEM partial proctectomy 7/3/201stable overnight; no bleeding   Plan:  Discharge later today without foley if voiding ok or with foley if unable to void ok.   LOS: 0 days     1 Day Post-Op  Chief Complaint/Subjective: No pain.  Foley removed earlier this morning.  Has not voided yet.  Objective: Vital signs in last 24 hours: Temp:  [97.5 F (36.4 C)-98.6 F (37 C)] 97.7 F (36.5 C) (07/04 0550) Pulse Rate:  [65-95] 65 (07/04 0550) Resp:  [14-20] 16 (07/04 0550) BP: (132-157)/(59-75) 147/59 (07/04 0550) SpO2:  [98 %-100 %] 99 % (07/04 0550) Weight:  [72.6 kg (160 lb)] 72.6 kg (160 lb) (07/03 1610) Last BM Date: 01/05/17  Intake/Output from previous day: 07/03 0701 - 07/04 0700 In: 2280 [P.O.:480; I.V.:1800] Out: 1485 [Urine:1475; Blood:10] Intake/Output this shift: No intake/output data recorded.  PE: General- In NAD.  Awake and alert. Abdomen-soft, not tender, no anorectal bleeding  Lab Results:  No results for input(s): WBC, HGB, HCT, PLT in the last 72 hours. BMET No results for input(s): NA, K, CL, CO2, GLUCOSE, BUN, CREATININE, CALCIUM in the last 72 hours. PT/INR No results for input(s): LABPROT, INR in the last 72 hours. Comprehensive Metabolic Panel:    Component Value Date/Time   NA 141 12/19/2016 0857   NA 138 11/28/2016 1310   NA 137 02/14/2010 0540   K 5.0 12/19/2016 0857   K 4.5 11/28/2016 1310   K 4.3 02/14/2010 0540   CL 104 11/28/2016 1310   CL 105 02/14/2010 0540   CO2 26 12/19/2016 0857   CO2 27 11/28/2016 1310   CO2 26 02/14/2010 0540   BUN 13.3 12/19/2016 0857   BUN 16 11/28/2016 1310   BUN 17 02/14/2010 0540   CREATININE 1.2 12/19/2016 0857   CREATININE 1.13 11/28/2016 1310   CREATININE 1.06 02/14/2010 0540   GLUCOSE 150 (H) 12/19/2016 0857   GLUCOSE 136 (H) 11/28/2016 1310   GLUCOSE 133 (H) 02/14/2010 0540   CALCIUM 9.9 12/19/2016 0857   CALCIUM 9.6 11/28/2016 1310   CALCIUM 8.3 (L) 02/14/2010 0540   AST 19 12/19/2016 0857   AST 20 02/05/2010 1154   ALT 16 12/19/2016 0857   ALT 13 02/05/2010 1154   ALKPHOS 44 12/19/2016 0857   ALKPHOS 40 02/05/2010 1154   BILITOT 0.58 12/19/2016 0857   BILITOT 0.7 02/05/2010 1154   PROT 7.0 12/19/2016 0857   PROT 6.9 02/05/2010 1154   ALBUMIN 3.8 12/19/2016 0857   ALBUMIN 3.9 02/05/2010 1154     Studies/Results: No results found.  Anti-infectives: Anti-infectives    Start     Dose/Rate Route Frequency Ordered Stop   01/06/17 0856  cefoTEtan in Dextrose 5% (CEFOTAN) IVPB 2 g     2 g Intravenous On call to O.R. 01/06/17 0856 01/06/17 1312       Jemmie Ledgerwood J 01/07/2017

## 2017-01-07 NOTE — Progress Notes (Signed)
Pt refused to have foley cath removed at midnight. agreed to have it taken out in the AM. Foley removed at 0510. Pt tolerated well. Will continue to monitor at this time.

## 2017-01-08 NOTE — Discharge Summary (Signed)
Physician Discharge Summary  Patient ID: Brendan Meyer MRN: [id number] DOB/AGE: [id number]  82 y.o.  Admit date: 01/06/2017 Discharge date: 01/07/2017  Patient Care Team: Reynold Bowen, MD as PCP - General (Endocrinology) Lenoard Aden, RN as Registered Nurse Milus Banister, MD as Consulting Physician (Gastroenterology)  Discharge Diagnoses:  Principal Problem:   Rectal cancer uT1uN0 s/p TEM partial proctectomy 01/06/2017   POST-OPERATIVE DIAGNOSIS:   T1 rectal cancer  SURGERY:  01/06/2017  Procedure(s): PARTIAL PROCTECTOMY OF EARLY RECTAL CANCER, TRANSANAL ENDOSCOPIC MICROSURGERY  ERAS PATHWAY EXAM UNDER ANESTHESIA  SURGEON:    Surgeon(s): Michael Boston, MD  Consults: None  Hospital Course:   The patient underwent the surgery above.  Postoperatively, the patient gradually mobilized and advanced to a solid diet.  Pain and other symptoms were treated aggressively.    By the time of discharge, the patient was walking well the hallways, eating food, having flatus.  Pain was well-controlled on an oral medications.  Based on meeting discharge criteria and continuing to recover, I felt it was safe for the patient to be discharged from the hospital to further recover with close followup. Postoperative recommendations were discussed in detail.  They are written as well.  Discharged Condition: good  Disposition:   01-Home or Self Care  Discharge Instructions    Call MD for:  hives    Complete by:  As directed    Call MD for:  persistant dizziness or light-headedness    Complete by:  As directed    Call MD for:  persistant nausea and vomiting    Complete by:  As directed    Call MD for:  redness, tenderness, or signs of infection (pain, swelling, redness, odor or green/yellow discharge around incision site)    Complete by:  As directed    You will often notice bleeding with bowel movements.  Expect some yellow or tan drainage.  This can occur for weeks, but should be  mild by the end of the first week of surgery.  Wear an absorbent pad or soft cotton gauze in your underwear until the drainage stops   Call MD for:  severe uncontrolled pain    Complete by:  As directed    Diet - low sodium heart healthy    Complete by:  As directed    Discharge instructions    Complete by:  As directed    See Rectal Surgery instruction sheet   Discharge wound care:    Complete by:  As directed    Wear an absorbent pad or soft cotton gauze in your underwear as needed to catch any drainage and help keep the area  Keep the area clean and dry.  Bathe / shower every day.  Keep the area clean by showering / bathing over the incision / wound.   It is okay to soak an open wound to help wash it.   Wet wipes or showers / gentle washing after bowel movements is often less traumatic than regular toilet paper. You will often notice bleeding with bowel movements.  This should slow down by the end of the first week of surgery Expect some drainage for a few weeks.  This should slow down by the end of the first week of surgery.  Wear an absorbent pad or soft cotton gauze in your underwear until the drainage stops.   Driving Restrictions    Complete by:  As directed    You may drive when you are no longer taking prescription  pain medication, you can comfortably sit for long periods of time, and you can safely maneuver your car and apply brakes.   Increase activity slowly    Complete by:  As directed    Lifting restrictions    Complete by:  As directed    You may resume regular (light) daily activities beginning the next day-such as daily self-care, walking, climbing stairs-gradually increasing activities as tolerated.  If you can walk 30 minutes without difficulty, it is safe to try more intense activity such as jogging, treadmill, bicycling, low-impact aerobics, swimming, etc. Save the most intensive and strenuous activity for last such as sit-ups, heavy lifting, contact sports, etc   Refrain from any heavy lifting or straining until you are off narcotics for pain control.  Remember: if it hurts to do it, don't do it: STOP   May shower / Bathe    Complete by:  As directed    Warm water sitz baths/tub soaks x 20-30 minutes, 4-8 times a day for comfort   May walk up steps    Complete by:  As directed    Sexual Activity Restrictions    Complete by:  As directed    You may have sexual intercourse when it is comfortable. If it hurts to do it, STOP.      Allergies as of 01/07/2017   No Known Allergies     Medication List    STOP taking these medications   metroNIDAZOLE 500 MG tablet Commonly known as:  FLAGYL   neomycin 500 MG tablet Commonly known as:  MYCIFRADIN     TAKE these medications   aspirin EC 81 MG tablet Take 81 mg by mouth daily.   diazepam 5 MG tablet Commonly known as:  VALIUM Take 1 tablet (5 mg total) by mouth every 6 (six) hours as needed for muscle spasms (dufficulty urinating).   latanoprost 0.005 % ophthalmic solution Commonly known as:  XALATAN Place 1 drop into both eyes at bedtime.   metFORMIN 500 MG tablet Commonly known as:  GLUCOPHAGE Take 500 mg by mouth 2 (two) times daily with a meal.   metoprolol tartrate 25 MG tablet Commonly known as:  LOPRESSOR Take 25 mg by mouth at bedtime.   oxyCODONE 5 MG immediate release tablet Commonly known as:  Oxy IR/ROXICODONE Take 1-2 tablets (5-10 mg total) by mouth every 4 (four) hours as needed for moderate pain, severe pain or breakthrough pain.   prednisoLONE acetate 1 % ophthalmic suspension Commonly known as:  PRED FORTE Place 1 drop into the left eye daily as needed.   research study medication Inject into the skin every evening. Investigational study drug-Insulin 48 units every evening at 8 pm   simvastatin 40 MG tablet Commonly known as:  ZOCOR Take 40 mg by mouth every evening.   tamsulosin 0.4 MG Caps capsule Commonly known as:  FLOMAX Take 1 capsule (0.4 mg total) by  mouth daily. To prevent difficulty urinating   Vitamin D 2000 units Caps Take 2,000 Units by mouth daily.   zolpidem 10 MG tablet Commonly known as:  AMBIEN Take 10 mg by mouth at bedtime as needed for sleep.       Significant Diagnostic Studies:  Results for orders placed or performed during the hospital encounter of 01/06/17 (from the past 72 hour(s))  Glucose, capillary     Status: Abnormal   Collection Time: 01/06/17  8:33 AM  Result Value Ref Range   Glucose-Capillary 101 (H) 65 - 99 mg/dL  Comment 1 Notify RN   Glucose, capillary     Status: None   Collection Time: 01/06/17 12:22 PM  Result Value Ref Range   Glucose-Capillary 98 65 - 99 mg/dL  Glucose, capillary     Status: None   Collection Time: 01/06/17  2:48 PM  Result Value Ref Range   Glucose-Capillary 98 65 - 99 mg/dL   Comment 1 Notify RN    Comment 2 Document in Chart   Glucose, capillary     Status: Abnormal   Collection Time: 01/06/17  5:00 PM  Result Value Ref Range   Glucose-Capillary 120 (H) 65 - 99 mg/dL  Glucose, capillary     Status: Abnormal   Collection Time: 01/06/17  9:32 PM  Result Value Ref Range   Glucose-Capillary 187 (H) 65 - 99 mg/dL  Glucose, capillary     Status: Abnormal   Collection Time: 01/07/17  7:56 AM  Result Value Ref Range   Glucose-Capillary 117 (H) 65 - 99 mg/dL    No results found.  Discharge Exam: Blood pressure (!) 147/59, pulse 65, temperature 97.7 F (36.5 C), temperature source Oral, resp. rate 16, height 5\' 11"  (1.803 m), weight 72.6 kg (160 lb), SpO2 99 %.  General: Pt awake/alert/oriented x4 in No acute distress Eyes: PERRL, normal EOM.  Sclera clear.  No icterus Neuro: CN II-XII intact w/o focal sensory/motor deficits. Lymph: No head/neck/groin lymphadenopathy Psych:  No delerium/psychosis/paranoia HENT: Normocephalic, Mucus membranes moist.  No thrush Neck: Supple, No tracheal deviation Chest: No chest wall pain w good excursion CV:  Pulses intact.   Regular rhythm MS: Normal AROM mjr joints.  No obvious deformity Abdomen: Soft.  Nondistended.  Nontender.  No evidence of peritonitis.  No incarcerated hernias. Rectal - swelling but no active bleeding Ext:  SCDs BLE.  No mjr edema.  No cyanosis Skin: No petechiae / purpura  Past Medical History:  Diagnosis Date  . BPH (benign prostatic hyperplasia)   . Cataract 08/18/2016   cataract removal with implant left eye  . Coronary artery disease    s/p CABG  . Diabetes mellitus    type II   . Dysrhythmia    hx of afib post  [facility] in 2012   . History of kidney stones   . Hyperlipidemia   . Kidney stones   . Neuropathy     Past Surgical History:  Procedure Laterality Date  . CIRCUMCISION    . COLONOSCOPY    . CORONARY ARTERY BYPASS GRAFT  02/2011   x 6 vessel  . CYSTOSCOPY    . CYSTOSCOPY/RETROGRADE/URETEROSCOPY Bilateral 11/11/2013   Procedure: CYSTOSCOPY, BILATERAL RETROGRADE PYELOGRAM LEFT URETEROSCOPY, COLLECTION OF LEFT URETERAL WASHINGS FOR CYTOLOGY;  Surgeon: Ardis Hughs, MD;  Location: [facility];  Service: Urology;  Laterality: Bilateral;  . eardrum    . EUS N/A 12/18/2016   Procedure: LOWER ENDOSCOPIC ULTRASOUND (EUS);  Surgeon: Milus Banister, MD;  Location: [facility];  Service: Endoscopy;  Laterality: N/A;  . HERNIA REPAIR     double  . LITHOTRIPSY    . PATENT FORAMEN OVALE CLOSURE  2011  . POLYPECTOMY    . TONSILLECTOMY    . TRANSANAL ENDOSCOPIC MICROSURGERY N/A 01/06/2017   Procedure: PARTIAL PROCTECTOMY OF EARLY RECTAL CANCER, TRANSANAL ENDOSCOPIC MICROSURGERY  ERAS PATHWAY;  Surgeon: Michael Boston, MD;  Location: [facility];  Service: General;  Laterality: N/A;  ERAS PATHWAY  . UPPER GASTROINTESTINAL ENDOSCOPY      Social History   Social History  .  Marital status: Married    Spouse name: N/A  . Number of children: N/A  . Years of education: N/A   Occupational History  . Not on file.   Social History Main Topics  . Smoking status: Never Smoker  .  Smokeless tobacco: Never Used  . Alcohol use No  . Drug use: No  . Sexual activity: Not on file   Other Topics Concern  . Not on file   Social History Narrative   Retired-   Married with one son and two daughters   Patient has never smoked   Alcohol use-no   Daily caffeine use 2 per day   Illicit drug use -no    Family History  Problem Relation Age of Onset  . Breast cancer Maternal Aunt   . Colon cancer Neg Hx   . Colon polyps Neg Hx   . Esophageal cancer Neg Hx   . Rectal cancer Neg Hx   . Stomach cancer Neg Hx     Current Facility-Administered Medications  Medication Dose Route Frequency Provider Last Rate Last Dose  . 0.9 %  sodium chloride infusion  500 mL Intravenous Continuous Irene Shipper, MD       Current Outpatient Prescriptions  Medication Sig Dispense Refill  . aspirin EC 81 MG tablet Take 81 mg by mouth daily.    . Cholecalciferol (VITAMIN D) 2000 UNITS CAPS Take 2,000 Units by mouth daily.    Marland Kitchen latanoprost (XALATAN) 0.005 % ophthalmic solution Place 1 drop into both eyes at bedtime.     . metFORMIN (GLUCOPHAGE) 500 MG tablet Take 500 mg by mouth 2 (two) times daily with a meal.      . metoprolol tartrate (LOPRESSOR) 25 MG tablet Take 25 mg by mouth at bedtime.     . prednisoLONE acetate (PRED FORTE) 1 % ophthalmic suspension Place 1 drop into the left eye daily as needed.     . research study medication Inject into the skin every evening. Investigational study drug-Insulin 48 units every evening at 8 pm    . simvastatin (ZOCOR) 40 MG tablet Take 40 mg by mouth every evening.     . zolpidem (AMBIEN) 10 MG tablet Take 10 mg by mouth at bedtime as needed for sleep.     . diazepam (VALIUM) 5 MG tablet Take 1 tablet (5 mg total) by mouth every 6 (six) hours as needed for muscle spasms (dufficulty urinating). 5 tablet 2  . oxyCODONE (OXY IR/ROXICODONE) 5 MG immediate release tablet Take 1-2 tablets (5-10 mg total) by mouth every 4 (four) hours as needed for  moderate pain, severe pain or breakthrough pain. 40 tablet 0  . tamsulosin (FLOMAX) 0.4 MG CAPS capsule Take 1 capsule (0.4 mg total) by mouth daily. To prevent difficulty urinating 14 capsule 1     No Known Allergies  Signed: Morton Peters, M.D., F.A.C.S. Gastrointestinal and Minimally Invasive Surgery [facility] [address], [place], [place] [address] [phone] Main / Paging   01/08/2017, 7:35 AM

## 2017-01-28 ENCOUNTER — Telehealth: Payer: Self-pay | Admitting: Oncology

## 2017-01-28 ENCOUNTER — Ambulatory Visit (HOSPITAL_BASED_OUTPATIENT_CLINIC_OR_DEPARTMENT_OTHER): Payer: Medicare HMO | Admitting: Oncology

## 2017-01-28 VITALS — BP 150/65 | HR 72 | Temp 98.2°F | Resp 18 | Wt 159.7 lb

## 2017-01-28 DIAGNOSIS — C20 Malignant neoplasm of rectum: Secondary | ICD-10-CM | POA: Diagnosis not present

## 2017-01-28 DIAGNOSIS — I2510 Atherosclerotic heart disease of native coronary artery without angina pectoris: Secondary | ICD-10-CM

## 2017-01-28 DIAGNOSIS — E119 Type 2 diabetes mellitus without complications: Secondary | ICD-10-CM

## 2017-01-28 NOTE — Telephone Encounter (Signed)
Scheduled appt per 7/25 los - Gave patient AVS and calender per los.  

## 2017-01-28 NOTE — Progress Notes (Addendum)
Brendan Meyer OFFICE PROGRESS NOTE   Diagnosis: Rectal cancer  INTERVAL HISTORY:   He returns as scheduled. He underwent a transanal resection of the rectal tumor by Dr. Johney Maine on 01/06/2017. He reports tolerating the procedure well. A distal rectal mass was noted. The pathology ([id number]) revealed adenocarcinoma, moderately differentiated. Tumor invaded the submucosa. Lymphovascular invasion was noted. The resection margins were negative. No lymph nodes were submitted.  He feels well. No difficulty with bowel function. No pain.  Objective:  Vital signs in last 24 hours:  Blood pressure (!) 150/65, pulse 72, temperature 98.2 F (36.8 C), temperature source Oral, resp. rate 18, weight 159 lb 11.2 oz (72.4 kg).    HEENT: Neck without mass Lymphatics: No cervical, supraclavicular, axillary, or inguinal nodes Resp: Lungs clear bilaterally with bronchial sounds at the upper posterior chest bilaterally, no respiratory distress Cardio: Regular rate and rhythm GI: No hepatomegaly, no mass Vascular: No leg edema   Lab Results:  Lab Results  Component Value Date   WBC 4.3 12/19/2016   HGB 14.1 12/19/2016   HCT 42.8 12/19/2016   MCV 87.3 12/19/2016   PLT 200 12/19/2016   NEUTROABS 2.2 12/19/2016    CMP     Component Value Date/Time   NA 141 12/19/2016 0857   K 5.0 12/19/2016 0857   CL 104 11/28/2016 1310   CO2 26 12/19/2016 0857   GLUCOSE 150 (H) 12/19/2016 0857   BUN 13.3 12/19/2016 0857   CREATININE 1.2 12/19/2016 0857   CALCIUM 9.9 12/19/2016 0857   PROT 7.0 12/19/2016 0857   ALBUMIN 3.8 12/19/2016 0857   AST 19 12/19/2016 0857   ALT 16 12/19/2016 0857   ALKPHOS 44 12/19/2016 0857   BILITOT 0.58 12/19/2016 0857   GFRNONAA >60 02/14/2010 0540   GFRAA  02/14/2010 0540    >60        The eGFR has been calculated using the MDRD equation. This calculation has not been validated in all clinical situations. eGFR's persistently <60 mL/min  signify possible Chronic Kidney Disease.    Lab Results  Component Value Date   CEA1 10.16 (H) 12/19/2016     Medications: I have reviewed the patient's current medications.  Assessment/Plan: 1. Rectal cancer, colonoscopy 11/24/2016 revealed a rectal mass extending to the dentate line, biopsy confirmed invasive adenocarcinoma ? CTs of the chest, abdomen, and pelvis 12/04/2016-negative for metastatic disease, no lymphadenopathy, rectal mass visualized ? EUS 12/18/2016,uT1N0 tumor with the distal edge at 0.5 cm from the anal verge ? Transanal excision 01/06/2017- T2, no lymph nodes submitted, lymphovascular invasion noted 2. Descending and sigmoid polyps removed at colonoscopy 11/24/2016-tubular adenomas  3. History of coronary artery disease, status post coronary artery bypass surgery  4. Diabetes    Disposition:Brendan Meyer has been diagnosed with early stage rectal cancer. I contacted pathology to clarify the stage of the lesion. Regardless of the T1 versus T2 determination he appears to have a stage I cancer.  He will continue close rectal surveillance with Dr. Johney Maine.  He will return in approximately one month for a repeat CEA to be sure this has normalized. He will return for an office visit and CEA in 6 months.  25 minutes were spent with the patient today. The majority of the time was used for counseling and coordination of care.   Donneta Romberg, MD  01/28/2017  10:42 AM I contacted Dr. Melina Copa from pathology. She indicates the lesion should be staged as a T2 tumor. The specimen was disrupted and  she cannot rule out a T3 tumor. I will add his case to the next GI tumor conference for further discussion.  Addendum: The final pathology report was revised in the lesion is staged as a T2 tumor. We presented his case at the GI tumor conference on 02/04/2017. The consensus of the conference attendees is to recommend adjuvant Xeloda/radiation. Brendan Meyer will be  contacted to arrange for radiation oncology and medical oncology appointments.

## 2017-01-29 DIAGNOSIS — I25810 Atherosclerosis of coronary artery bypass graft(s) without angina pectoris: Secondary | ICD-10-CM | POA: Diagnosis not present

## 2017-01-29 DIAGNOSIS — N08 Glomerular disorders in diseases classified elsewhere: Secondary | ICD-10-CM | POA: Diagnosis not present

## 2017-01-29 DIAGNOSIS — N200 Calculus of kidney: Secondary | ICD-10-CM | POA: Diagnosis not present

## 2017-01-29 DIAGNOSIS — N401 Enlarged prostate with lower urinary tract symptoms: Secondary | ICD-10-CM | POA: Diagnosis not present

## 2017-01-29 DIAGNOSIS — C20 Malignant neoplasm of rectum: Secondary | ICD-10-CM | POA: Diagnosis not present

## 2017-01-29 DIAGNOSIS — E1142 Type 2 diabetes mellitus with diabetic polyneuropathy: Secondary | ICD-10-CM | POA: Diagnosis not present

## 2017-01-29 DIAGNOSIS — E1149 Type 2 diabetes mellitus with other diabetic neurological complication: Secondary | ICD-10-CM | POA: Diagnosis not present

## 2017-01-29 DIAGNOSIS — F418 Other specified anxiety disorders: Secondary | ICD-10-CM | POA: Diagnosis not present

## 2017-01-29 DIAGNOSIS — F5104 Psychophysiologic insomnia: Secondary | ICD-10-CM | POA: Diagnosis not present

## 2017-02-05 ENCOUNTER — Telehealth: Payer: Self-pay | Admitting: Radiation Oncology

## 2017-02-05 ENCOUNTER — Telehealth: Payer: Self-pay | Admitting: Oncology

## 2017-02-05 NOTE — Telephone Encounter (Signed)
I also talked to him I will see him 8/15 and plan for xeloda/xrt   Thanks

## 2017-02-05 NOTE — Telephone Encounter (Signed)
Scheduled appt per sch message from [facility] - Left message with appt date and time .

## 2017-02-05 NOTE — Telephone Encounter (Signed)
I spoke with the patient and reviewed the rationale for ChemoRT and we discussed the high risk features on his pathology report. He is in agreement and would like to bump up his CEA testing earlier. We will plan for simulation the week of 8/14 and start the following week with treatment.

## 2017-02-18 ENCOUNTER — Ambulatory Visit (HOSPITAL_BASED_OUTPATIENT_CLINIC_OR_DEPARTMENT_OTHER): Payer: Medicare HMO | Admitting: Oncology

## 2017-02-18 ENCOUNTER — Telehealth: Payer: Self-pay | Admitting: Oncology

## 2017-02-18 VITALS — BP 107/67 | HR 83 | Temp 98.9°F | Resp 20 | Ht 71.0 in | Wt 159.2 lb

## 2017-02-18 DIAGNOSIS — C20 Malignant neoplasm of rectum: Secondary | ICD-10-CM | POA: Diagnosis not present

## 2017-02-18 DIAGNOSIS — E119 Type 2 diabetes mellitus without complications: Secondary | ICD-10-CM

## 2017-02-18 NOTE — Telephone Encounter (Signed)
Scheduled appt per 8/15 los - Gave patient AVS and calender per los.  

## 2017-02-18 NOTE — Progress Notes (Signed)
  Brendan Meyer OFFICE PROGRESS NOTE   Diagnosis: Rectal cancer  INTERVAL HISTORY:   Brendan Meyer returns for further discussion regarding adjuvant treatment for the rectal cancer. He feels well. No difficulty with bowel function. No bleeding or rectal pain.   Objective:  Vital signs in last 24 hours:  Blood pressure 107/67, pulse 83, temperature 98.9 F (37.2 C), temperature source Oral, resp. rate 20, height '5\' 11"'$  (1.803 m), weight 159 lb 3.2 oz (72.2 kg), SpO2 98 %.    Lymphatics:  no inguinal nodes Resp:  lungs clear bilaterally Cardio:  regular rate and rhythm GI:  no hepatosplenomegaly, no mass, nontender Vascular:  no leg edema   Lab Results:  Lab Results  Component Value Date   WBC 4.3 12/19/2016   HGB 14.1 12/19/2016   HCT 42.8 12/19/2016   MCV 87.3 12/19/2016   PLT 200 12/19/2016   NEUTROABS 2.2 12/19/2016    CMP     Component Value Date/Time   NA 141 12/19/2016 0857   K 5.0 12/19/2016 0857   CL 104 11/28/2016 1310   CO2 26 12/19/2016 0857   GLUCOSE 150 (H) 12/19/2016 0857   BUN 13.3 12/19/2016 0857   CREATININE 1.2 12/19/2016 0857   CALCIUM 9.9 12/19/2016 0857   PROT 7.0 12/19/2016 0857   ALBUMIN 3.8 12/19/2016 0857   AST 19 12/19/2016 0857   ALT 16 12/19/2016 0857   ALKPHOS 44 12/19/2016 0857   BILITOT 0.58 12/19/2016 0857   GFRNONAA >60 02/14/2010 0540   GFRAA  02/14/2010 0540    >60        The eGFR has been calculated using the MDRD equation. This calculation has not been validated in all clinical situations. eGFR's persistently <60 mL/min signify possible Chronic Kidney Disease.    Lab Results  Component Value Date   CEA1 10.16 (H) 12/19/2016    Medications: I have reviewed the patient's current medications.  Assessment/Plan: 1. Rectal cancer, colonoscopy 11/24/2016 revealed a rectal mass extending to the dentate line, biopsy confirmed invasive adenocarcinoma ? CTs of the chest, abdomen, and pelvis  12/04/2016-negative for metastatic disease, no lymphadenopathy, rectal mass visualized ? EUS 12/18/2016,uT1N0tumor with the distal edge at 0.5 cm from the anal verge ? Transanal excision 01/06/2017- T2, no lymph nodes submitted, lymphovascular invasion noted 2. Descending and sigmoid polyps removed at colonoscopy 11/24/2016-tubular adenomas  3. History of coronary artery disease, status post coronary artery bypass surgery  4. Diabetes   Disposition:   He appears stable. His case was presented at the GI tumor conference and the consensus recommendation is to proceed with adjuvant Xeloda/radiation. He does not wish to consider an APR procedure.  I discussed the case with Dr. Lisbeth Renshaw. We recommend concurrent Xeloda and radiation. We reviewed the potential toxicities associated with Xeloda including the chance for nausea, mucositis, diarrhea, and hematologic toxicity. We discussed the rash, sun sensitivity, hyperpigmentation, and hand/foot syndrome associated with Xeloda.Brendan Meyer will attend a chemotherapy teaching class. He agrees to proceed.  The plan is to initiate concurrent Xeloda and radiation on 03/02/2017. We will check a baseline CBC, chem panel, and CEA when he is here for a chemotherapy teaching class. He will return for an office visit on 03/16/2017.  25 minutes were spent with the patient today. The majority of the time was used for counseling and coordination of care.  Donneta Romberg, MD  02/18/2017  12:21 PM

## 2017-02-19 ENCOUNTER — Other Ambulatory Visit (HOSPITAL_BASED_OUTPATIENT_CLINIC_OR_DEPARTMENT_OTHER): Payer: Medicare HMO

## 2017-02-19 ENCOUNTER — Telehealth: Payer: Self-pay | Admitting: Pharmacist

## 2017-02-19 ENCOUNTER — Encounter: Payer: Self-pay | Admitting: *Deleted

## 2017-02-19 ENCOUNTER — Other Ambulatory Visit: Payer: Medicare HMO

## 2017-02-19 DIAGNOSIS — C20 Malignant neoplasm of rectum: Secondary | ICD-10-CM | POA: Diagnosis not present

## 2017-02-19 LAB — COMPREHENSIVE METABOLIC PANEL
ALBUMIN: 3.7 g/dL (ref 3.5–5.0)
ALT: 22 U/L (ref 0–55)
AST: 15 U/L (ref 5–34)
Alkaline Phosphatase: 54 U/L (ref 40–150)
Anion Gap: 8 mEq/L (ref 3–11)
BUN: 15.6 mg/dL (ref 7.0–26.0)
CALCIUM: 10 mg/dL (ref 8.4–10.4)
CHLORIDE: 106 meq/L (ref 98–109)
CO2: 26 mEq/L (ref 22–29)
CREATININE: 1 mg/dL (ref 0.7–1.3)
EGFR: 68 mL/min/{1.73_m2} — ABNORMAL LOW (ref >90)
GLUCOSE: 104 mg/dL (ref 70–140)
POTASSIUM: 5 meq/L (ref 3.5–5.1)
SODIUM: 141 meq/L (ref 136–145)
Total Bilirubin: 0.55 mg/dL (ref 0.20–1.20)
Total Protein: 7 g/dL (ref 6.4–8.3)

## 2017-02-19 LAB — CBC WITH DIFFERENTIAL/PLATELET
BASO%: 0.6 % (ref 0.0–2.0)
Basophils Absolute: 0 10*3/uL (ref 0.0–0.1)
EOS ABS: 0.2 10*3/uL (ref 0.0–0.5)
EOS%: 5 % (ref 0.0–7.0)
HCT: 42.3 % (ref 38.4–49.9)
HGB: 14.1 g/dL (ref 13.0–17.1)
LYMPH%: 40.2 % (ref 14.0–49.0)
MCH: 28.6 pg (ref 27.2–33.4)
MCHC: 33.3 g/dL (ref 32.0–36.0)
MCV: 85.7 fL (ref 79.3–98.0)
MONO#: 0.5 10*3/uL (ref 0.1–0.9)
MONO%: 9.9 % (ref 0.0–14.0)
NEUT#: 2.1 10*3/uL (ref 1.5–6.5)
NEUT%: 44.3 % (ref 39.0–75.0)
PLATELETS: 225 10*3/uL (ref 140–400)
RBC: 4.94 10*6/uL (ref 4.20–5.82)
RDW: 14.4 % (ref 11.0–14.6)
WBC: 4.8 10*3/uL (ref 4.0–10.3)
lymph#: 1.9 10*3/uL (ref 0.9–3.3)

## 2017-02-19 LAB — CEA (IN HOUSE-CHCC)

## 2017-02-19 MED ORDER — CAPECITABINE 500 MG PO TABS: ORAL_TABLET | ORAL | 0 refills | Status: DC

## 2017-02-19 MED FILL — CAPECITABINE 500 MG TABLET: 500 | 30 days supply | Qty: 132 | Fill #0

## 2017-02-19 NOTE — Progress Notes (Signed)
Follow up new consult Rectal cancer   Diagnosis 01/06/17: Dr. Alwyn Pea, MD Rectum, resection - ADENOCARCINOMA, MODERATELY DIFFERENTIATED - LYMPHOVASCULAR SPACE INVASION PRESENT - SEE ONCOLOGY TABLE AND COMMENT BELOW   Dr. Benay Spice note 02/18/17: The plan is to initiate concurrent Xeloda and radiation on 03/02/2017. We will check a baseline CBC, chem panel, and CEA when he is here for a chemotherapy teaching class.  He will return for an office visit on 03/16/2017.  No c/o pain or discomfort, occasional abdominal gas,  Well healed, bowels regular, bladder regular, appetite good, fatigued slight in afternoons BP 136/71 Comment: lfa sitting  Pulse 75   Temp 98 F (36.7 C) (Oral)   Resp 20   Ht 5\' 11"  (1.803 m)   Wt 160 lb 6.4 oz (72.8 kg)   BMI 22.37 kg/m   Wt Readings from Last 3 Encounters:  02/20/17 160 lb 6.4 oz (72.8 kg)  02/18/17 159 lb 3.2 oz (72.2 kg)  01/28/17 159 lb 11.2 oz (72.4 kg)

## 2017-02-19 NOTE — Telephone Encounter (Signed)
Oral Oncology Pharmacist Encounter  Received new prescription for Xeloda for the adjuvant treatment of rectal cancer in conjunction with radiation, planned duration 5 and 1/2 - 6 weeks.  Labs from 02/19/17 assessed, OK for treatment.  Current medication list in Epic reviewed, no significant DDIs with Xeloda identified.  Prescription has been e-scribed to the [facility] for benefits analysis and approval. Prescription will be filled in 2 separate fills per insurance requirement:  1st fill for 22 days of treatment (30 calendar days) = 132 tablets (copay ~$150)  2nd fill for remaining 6 days of treatment (8 calendar days) = 36 tablets (copay ~$40)  Patient states he is able to afford his copayments.  Prescription will be filled at the [facility] on Wednesday 02/25/17 per patient request.  I spoke with patient for overview of new oral chemotherapy medication: Xeloda.   Counseled patient on administration, dosing, side effects, safe handling, and monitoring. Patient will take Xeloda 500mg  tablets, 3 tablets (1500mg ) by mouth in AM and 3 tabs (1500mg ) by mouth in PM, within 30 minutes of finishing a meal, on days of radiation only.  Planned Xeloda/radiation start date 03/02/17, however, patient has not yet undergone CT simulation.  Side effects include but not limited to: fatigue, decreased blood counts, GI upset, diarrhea, and hand-foot syndrome.    Reviewed with patient importance of keeping a medication schedule and plan for any missed doses.  Brendan Meyer voiced understanding and appreciation.   All questions answered.  Will follow up with patient regarding radiation schedule and Xeloda start date.   Patient knows to call the office with questions or concerns. Oral Oncology Clinic will continue to follow.  Thank you,  Brendan Meyer, PharmD, BCPS, BCOP 02/19/2017  10:29 AM Oral Oncology Clinic [phone]

## 2017-02-20 ENCOUNTER — Ambulatory Visit
Admission: RE | Admit: 2017-02-20 | Discharge: 2017-02-20 | Disposition: A | Discharge status: Home / Self Care | Payer: Medicare HMO | Source: Ambulatory Visit | Attending: Radiation Oncology | Admitting: Radiation Oncology

## 2017-02-20 ENCOUNTER — Encounter: Payer: Self-pay | Admitting: Radiation Oncology

## 2017-02-20 DIAGNOSIS — Z9889 Other specified postprocedural states: Secondary | ICD-10-CM | POA: Diagnosis not present

## 2017-02-20 DIAGNOSIS — Z951 Presence of aortocoronary bypass graft: Secondary | ICD-10-CM | POA: Diagnosis not present

## 2017-02-20 DIAGNOSIS — C20 Malignant neoplasm of rectum: Secondary | ICD-10-CM

## 2017-02-20 DIAGNOSIS — F329 Major depressive disorder, single episode, unspecified: Secondary | ICD-10-CM | POA: Diagnosis not present

## 2017-02-20 DIAGNOSIS — Z510 Encounter for antineoplastic radiation therapy: Secondary | ICD-10-CM | POA: Diagnosis not present

## 2017-02-20 DIAGNOSIS — E119 Type 2 diabetes mellitus without complications: Secondary | ICD-10-CM | POA: Diagnosis not present

## 2017-02-20 DIAGNOSIS — E785 Hyperlipidemia, unspecified: Secondary | ICD-10-CM | POA: Diagnosis not present

## 2017-02-20 DIAGNOSIS — I2510 Atherosclerotic heart disease of native coronary artery without angina pectoris: Secondary | ICD-10-CM | POA: Diagnosis not present

## 2017-02-20 DIAGNOSIS — Z87442 Personal history of urinary calculi: Secondary | ICD-10-CM | POA: Diagnosis not present

## 2017-02-20 NOTE — Progress Notes (Signed)
Radiation Oncology         (336) [phone] ________________________________  Name: BILAAL LEIB MRN: [id number]  Date: 02/20/2017  DOB: 12-14-1934  Follow-Up Visit Note  CC: Reynold Bowen, MD  Ladell Pier, MD  Diagnosis:      ICD-10-CM   1. Rectal cancer ([place]) C20       Narrative:  The patient returns today for follow-up.  His case has been discussed in multidisciplinary GI clinic after undergoing a transanal excision. Based on the depth of invasion, postoperative chemoradiation treatment has been recommended for him if the patient does not proceed with an open surgical procedure. The patient does not wish to do this and therefore has discussed chemoradiation treatment with medical oncology and I have been asked to see the patient today for this as well. The patient states that he is doing well in terms of recovering from resection and has no new complaints today.                              ALLERGIES:  has No Known Allergies.  Meds: Current Outpatient Prescriptions  Medication Sig Dispense Refill  . aspirin EC 81 MG tablet Take 81 mg by mouth daily.    . Cholecalciferol (VITAMIN D) 2000 UNITS CAPS Take 2,000 Units by mouth daily.    Marland Kitchen latanoprost (XALATAN) 0.005 % ophthalmic solution Place 1 drop into both eyes at bedtime.     . metFORMIN (GLUCOPHAGE) 500 MG tablet Take 500 mg by mouth 2 (two) times daily with a meal.      . metoprolol tartrate (LOPRESSOR) 25 MG tablet Take 25 mg by mouth at bedtime.     . prednisoLONE acetate (PRED FORTE) 1 % ophthalmic suspension Place 1 drop into the left eye daily as needed.     . research study medication Inject into the skin every evening. Investigational study drug-Insulin 48 units every evening at 8 pm    . simvastatin (ZOCOR) 40 MG tablet Take 40 mg by mouth every evening.     . zolpidem (AMBIEN) 10 MG tablet Take 10 mg by mouth at bedtime as needed for sleep.     . capecitabine (XELODA) 500 MG tablet Take 3 tablets (1500mg ) by  mouth in AM and 3 tabs (1500mg ) in PM, within 30 minutes of meals, on days of radiation only (Patient not taking: Reported on 02/20/2017) 168 tablet 0  . diazepam (VALIUM) 5 MG tablet Take 1 tablet (5 mg total) by mouth every 6 (six) hours as needed for muscle spasms (dufficulty urinating). (Patient not taking: Reported on 02/18/2017) 5 tablet 2  . oxycodone (OXY-IR) 5 MG capsule Take 5 mg by mouth every 4 (four) hours as needed.     Current Facility-Administered Medications  Medication Dose Route Frequency Provider Last Rate Last Dose  . 0.9 %  sodium chloride infusion  500 mL Intravenous Continuous Irene Shipper, MD        Physical Findings: The patient is in no acute distress. Patient is alert and oriented.  height is 5\' 11"  (1.803 m) and weight is 160 lb 6.4 oz (72.8 kg). His oral temperature is 98 F (36.7 C). His blood pressure is 136/71 and his pulse is 75. His respiration is 20. Marland Kitchen   Alert, no acute distress  Lab Findings: Lab Results  Component Value Date   WBC 4.8 02/19/2017   HGB 14.1 02/19/2017   HCT 42.3 02/19/2017  MCV 85.7 02/19/2017   PLT 225 02/19/2017     Radiographic Findings: No results found.  Impression:    The patient is appropriate to proceed with a course of chemoradiation treatment after transanal excision. This was once again discussed with him and all of his questions were answered. I believe that this would decrease the chance of local failure. He does wish to proceed with simulation.  Plan:  Simulation today for treatment planning. The patient will undergo a 5-1/2 week course of radiation treatment with concurrent Xeloda chemotherapy. Father.  The patient was seen today for 15 minutes, with the majority of the time spent counseling the patient on his diagnosis of cancer and coordinating his care.     Jodelle Gross, M.D., Ph.D.

## 2017-02-20 NOTE — Progress Notes (Signed)
Please see the Nurse Progress Note in the MD Initial Consult Encounter for this patient. 

## 2017-02-23 ENCOUNTER — Telehealth: Payer: Self-pay | Admitting: *Deleted

## 2017-02-23 NOTE — Telephone Encounter (Signed)
Notified wife of CEA result. She voiced appreciation for call.

## 2017-02-23 NOTE — Telephone Encounter (Signed)
-----   Message from Ladell Pier, MD sent at 02/19/2017  9:22 PM EDT ----- Please call patient, Brendan Meyer is normal

## 2017-02-26 DIAGNOSIS — Z510 Encounter for antineoplastic radiation therapy: Secondary | ICD-10-CM | POA: Diagnosis not present

## 2017-02-26 DIAGNOSIS — Z87442 Personal history of urinary calculi: Secondary | ICD-10-CM | POA: Diagnosis not present

## 2017-02-26 DIAGNOSIS — I2510 Atherosclerotic heart disease of native coronary artery without angina pectoris: Secondary | ICD-10-CM | POA: Diagnosis not present

## 2017-02-26 DIAGNOSIS — E119 Type 2 diabetes mellitus without complications: Secondary | ICD-10-CM | POA: Diagnosis not present

## 2017-02-26 DIAGNOSIS — Z9889 Other specified postprocedural states: Secondary | ICD-10-CM | POA: Diagnosis not present

## 2017-02-26 DIAGNOSIS — F329 Major depressive disorder, single episode, unspecified: Secondary | ICD-10-CM | POA: Diagnosis not present

## 2017-02-26 DIAGNOSIS — Z951 Presence of aortocoronary bypass graft: Secondary | ICD-10-CM | POA: Diagnosis not present

## 2017-02-26 DIAGNOSIS — E785 Hyperlipidemia, unspecified: Secondary | ICD-10-CM | POA: Diagnosis not present

## 2017-02-26 DIAGNOSIS — C20 Malignant neoplasm of rectum: Secondary | ICD-10-CM | POA: Diagnosis not present

## 2017-02-27 ENCOUNTER — Other Ambulatory Visit: Payer: Medicare HMO

## 2017-03-02 ENCOUNTER — Ambulatory Visit
Admission: RE | Admit: 2017-03-02 | Discharge: 2017-03-02 | Disposition: A | Discharge status: Home / Self Care | Payer: Medicare HMO | Source: Ambulatory Visit | Attending: Radiation Oncology | Admitting: Radiation Oncology

## 2017-03-02 DIAGNOSIS — E119 Type 2 diabetes mellitus without complications: Secondary | ICD-10-CM | POA: Diagnosis not present

## 2017-03-02 DIAGNOSIS — E785 Hyperlipidemia, unspecified: Secondary | ICD-10-CM | POA: Diagnosis not present

## 2017-03-02 DIAGNOSIS — F329 Major depressive disorder, single episode, unspecified: Secondary | ICD-10-CM | POA: Diagnosis not present

## 2017-03-02 DIAGNOSIS — Z510 Encounter for antineoplastic radiation therapy: Secondary | ICD-10-CM | POA: Diagnosis not present

## 2017-03-02 DIAGNOSIS — Z9889 Other specified postprocedural states: Secondary | ICD-10-CM | POA: Diagnosis not present

## 2017-03-02 DIAGNOSIS — Z87442 Personal history of urinary calculi: Secondary | ICD-10-CM | POA: Diagnosis not present

## 2017-03-02 DIAGNOSIS — C20 Malignant neoplasm of rectum: Secondary | ICD-10-CM | POA: Diagnosis not present

## 2017-03-02 DIAGNOSIS — I2510 Atherosclerotic heart disease of native coronary artery without angina pectoris: Secondary | ICD-10-CM | POA: Diagnosis not present

## 2017-03-02 DIAGNOSIS — Z951 Presence of aortocoronary bypass graft: Secondary | ICD-10-CM | POA: Diagnosis not present

## 2017-03-03 ENCOUNTER — Ambulatory Visit
Admission: RE | Admit: 2017-03-03 | Discharge: 2017-03-03 | Disposition: A | Discharge status: Home / Self Care | Payer: Medicare HMO | Source: Ambulatory Visit | Attending: Radiation Oncology | Admitting: Radiation Oncology

## 2017-03-03 DIAGNOSIS — E785 Hyperlipidemia, unspecified: Secondary | ICD-10-CM | POA: Diagnosis not present

## 2017-03-03 DIAGNOSIS — Z510 Encounter for antineoplastic radiation therapy: Secondary | ICD-10-CM | POA: Diagnosis not present

## 2017-03-03 DIAGNOSIS — E119 Type 2 diabetes mellitus without complications: Secondary | ICD-10-CM | POA: Insufficient documentation

## 2017-03-03 DIAGNOSIS — Z9889 Other specified postprocedural states: Secondary | ICD-10-CM | POA: Insufficient documentation

## 2017-03-03 DIAGNOSIS — C20 Malignant neoplasm of rectum: Secondary | ICD-10-CM | POA: Insufficient documentation

## 2017-03-03 DIAGNOSIS — Z79899 Other long term (current) drug therapy: Secondary | ICD-10-CM | POA: Insufficient documentation

## 2017-03-03 DIAGNOSIS — Z951 Presence of aortocoronary bypass graft: Secondary | ICD-10-CM | POA: Insufficient documentation

## 2017-03-03 DIAGNOSIS — I2510 Atherosclerotic heart disease of native coronary artery without angina pectoris: Secondary | ICD-10-CM | POA: Insufficient documentation

## 2017-03-03 DIAGNOSIS — Z87442 Personal history of urinary calculi: Secondary | ICD-10-CM | POA: Insufficient documentation

## 2017-03-03 DIAGNOSIS — Z803 Family history of malignant neoplasm of breast: Secondary | ICD-10-CM | POA: Insufficient documentation

## 2017-03-03 DIAGNOSIS — F329 Major depressive disorder, single episode, unspecified: Secondary | ICD-10-CM | POA: Insufficient documentation

## 2017-03-03 DIAGNOSIS — Z7982 Long term (current) use of aspirin: Secondary | ICD-10-CM | POA: Insufficient documentation

## 2017-03-03 DIAGNOSIS — Z794 Long term (current) use of insulin: Secondary | ICD-10-CM | POA: Diagnosis not present

## 2017-03-04 ENCOUNTER — Ambulatory Visit
Admission: RE | Admit: 2017-03-04 | Discharge: 2017-03-04 | Disposition: A | Discharge status: Home / Self Care | Payer: Medicare HMO | Source: Ambulatory Visit | Attending: Radiation Oncology | Admitting: Radiation Oncology

## 2017-03-04 DIAGNOSIS — Z951 Presence of aortocoronary bypass graft: Secondary | ICD-10-CM | POA: Diagnosis not present

## 2017-03-04 DIAGNOSIS — I2510 Atherosclerotic heart disease of native coronary artery without angina pectoris: Secondary | ICD-10-CM | POA: Diagnosis not present

## 2017-03-04 DIAGNOSIS — E785 Hyperlipidemia, unspecified: Secondary | ICD-10-CM | POA: Diagnosis not present

## 2017-03-04 DIAGNOSIS — Z87442 Personal history of urinary calculi: Secondary | ICD-10-CM | POA: Diagnosis not present

## 2017-03-04 DIAGNOSIS — Z510 Encounter for antineoplastic radiation therapy: Secondary | ICD-10-CM | POA: Diagnosis not present

## 2017-03-04 DIAGNOSIS — E119 Type 2 diabetes mellitus without complications: Secondary | ICD-10-CM | POA: Diagnosis not present

## 2017-03-04 DIAGNOSIS — C20 Malignant neoplasm of rectum: Secondary | ICD-10-CM | POA: Diagnosis not present

## 2017-03-04 DIAGNOSIS — Z9889 Other specified postprocedural states: Secondary | ICD-10-CM | POA: Diagnosis not present

## 2017-03-04 DIAGNOSIS — F329 Major depressive disorder, single episode, unspecified: Secondary | ICD-10-CM | POA: Diagnosis not present

## 2017-03-04 NOTE — Progress Notes (Signed)
Pt educ done, my business card, sitz bath, radiation therapy and you book, dicussed side effects and ways to manage, pain, skin irritation, fatigue, n,v,d, may need to use imodium prn and low fiber diet for diarrhea, eat smaller meals 5-6 instead of 3 large meals, dysuria, losss pubic hair, avoid spicy,greasy foods, increase protein in diet and increase fluids, water,gator ade,stay hydrated, use of sitz bath prn irritated bottom, baby wipes non alcohol, tucks, teach back given, sees MD and prn 4:23 PM

## 2017-03-05 ENCOUNTER — Ambulatory Visit
Admission: RE | Admit: 2017-03-05 | Discharge: 2017-03-05 | Disposition: A | Discharge status: Home / Self Care | Payer: Medicare HMO | Source: Ambulatory Visit | Attending: Radiation Oncology | Admitting: Radiation Oncology

## 2017-03-05 DIAGNOSIS — E785 Hyperlipidemia, unspecified: Secondary | ICD-10-CM | POA: Diagnosis not present

## 2017-03-05 DIAGNOSIS — Z510 Encounter for antineoplastic radiation therapy: Secondary | ICD-10-CM | POA: Diagnosis not present

## 2017-03-05 DIAGNOSIS — Z9889 Other specified postprocedural states: Secondary | ICD-10-CM | POA: Diagnosis not present

## 2017-03-05 DIAGNOSIS — Z951 Presence of aortocoronary bypass graft: Secondary | ICD-10-CM | POA: Diagnosis not present

## 2017-03-05 DIAGNOSIS — I2510 Atherosclerotic heart disease of native coronary artery without angina pectoris: Secondary | ICD-10-CM | POA: Diagnosis not present

## 2017-03-05 DIAGNOSIS — Z87442 Personal history of urinary calculi: Secondary | ICD-10-CM | POA: Diagnosis not present

## 2017-03-05 DIAGNOSIS — C20 Malignant neoplasm of rectum: Secondary | ICD-10-CM | POA: Diagnosis not present

## 2017-03-05 DIAGNOSIS — F329 Major depressive disorder, single episode, unspecified: Secondary | ICD-10-CM | POA: Diagnosis not present

## 2017-03-05 DIAGNOSIS — E119 Type 2 diabetes mellitus without complications: Secondary | ICD-10-CM | POA: Diagnosis not present

## 2017-03-06 ENCOUNTER — Ambulatory Visit
Admission: RE | Admit: 2017-03-06 | Discharge: 2017-03-06 | Disposition: A | Discharge status: Home / Self Care | Payer: Medicare HMO | Source: Ambulatory Visit | Attending: Radiation Oncology | Admitting: Radiation Oncology

## 2017-03-06 DIAGNOSIS — Z87442 Personal history of urinary calculi: Secondary | ICD-10-CM | POA: Diagnosis not present

## 2017-03-06 DIAGNOSIS — Z510 Encounter for antineoplastic radiation therapy: Secondary | ICD-10-CM | POA: Diagnosis not present

## 2017-03-06 DIAGNOSIS — E119 Type 2 diabetes mellitus without complications: Secondary | ICD-10-CM | POA: Diagnosis not present

## 2017-03-06 DIAGNOSIS — C20 Malignant neoplasm of rectum: Secondary | ICD-10-CM | POA: Diagnosis not present

## 2017-03-06 DIAGNOSIS — F329 Major depressive disorder, single episode, unspecified: Secondary | ICD-10-CM | POA: Diagnosis not present

## 2017-03-06 DIAGNOSIS — I2510 Atherosclerotic heart disease of native coronary artery without angina pectoris: Secondary | ICD-10-CM | POA: Diagnosis not present

## 2017-03-06 DIAGNOSIS — Z951 Presence of aortocoronary bypass graft: Secondary | ICD-10-CM | POA: Diagnosis not present

## 2017-03-06 DIAGNOSIS — E785 Hyperlipidemia, unspecified: Secondary | ICD-10-CM | POA: Diagnosis not present

## 2017-03-06 DIAGNOSIS — Z9889 Other specified postprocedural states: Secondary | ICD-10-CM | POA: Diagnosis not present

## 2017-03-10 ENCOUNTER — Ambulatory Visit
Admission: RE | Admit: 2017-03-10 | Discharge: 2017-03-10 | Disposition: A | Discharge status: Home / Self Care | Payer: Medicare HMO | Source: Ambulatory Visit | Attending: Radiation Oncology | Admitting: Radiation Oncology

## 2017-03-10 DIAGNOSIS — F329 Major depressive disorder, single episode, unspecified: Secondary | ICD-10-CM | POA: Diagnosis not present

## 2017-03-10 DIAGNOSIS — I2510 Atherosclerotic heart disease of native coronary artery without angina pectoris: Secondary | ICD-10-CM | POA: Diagnosis not present

## 2017-03-10 DIAGNOSIS — C20 Malignant neoplasm of rectum: Secondary | ICD-10-CM | POA: Diagnosis not present

## 2017-03-10 DIAGNOSIS — E119 Type 2 diabetes mellitus without complications: Secondary | ICD-10-CM | POA: Diagnosis not present

## 2017-03-10 DIAGNOSIS — Z9889 Other specified postprocedural states: Secondary | ICD-10-CM | POA: Diagnosis not present

## 2017-03-10 DIAGNOSIS — Z87442 Personal history of urinary calculi: Secondary | ICD-10-CM | POA: Diagnosis not present

## 2017-03-10 DIAGNOSIS — Z510 Encounter for antineoplastic radiation therapy: Secondary | ICD-10-CM | POA: Diagnosis not present

## 2017-03-10 DIAGNOSIS — E785 Hyperlipidemia, unspecified: Secondary | ICD-10-CM | POA: Diagnosis not present

## 2017-03-10 DIAGNOSIS — Z951 Presence of aortocoronary bypass graft: Secondary | ICD-10-CM | POA: Diagnosis not present

## 2017-03-10 NOTE — Progress Notes (Signed)
  Radiation Oncology         [phone]) [phone] ________________________________  Name: Brendan Meyer MRN: [id number]  Date: 02/20/2017  DOB: 1934/12/21  Optical Surface Tracking Plan:  Since intensity modulated radiotherapy (IMRT) and 3D conformal radiation treatment methods are predicated on accurate and precise positioning for treatment, intrafraction motion monitoring is medically necessary to ensure accurate and safe treatment delivery.  The ability to quantify intrafraction motion without excessive ionizing radiation dose can only be performed with optical surface tracking. Accordingly, surface imaging offers the opportunity to obtain 3D measurements of patient position throughout IMRT and 3D treatments without excessive radiation exposure.  I am ordering optical surface tracking for this patient's upcoming course of radiotherapy. ________________________________  Kyung Rudd, MD 03/10/2017 7:17 AM    Reference:   Particia Jasper, et al. Surface imaging-based analysis of intrafraction motion for breast radiotherapy patients.Journal of [place], n. 6, nov. 2014. ISSN [id number].   Available at: <http://www.jacmp.org/index.php/jacmp/article/view/4957>.

## 2017-03-10 NOTE — Progress Notes (Signed)
  Radiation Oncology         ([phone]) [phone] ________________________________  Name: Brendan Meyer MRN: [id number]  Date: 02/20/2017  DOB: Jul 25, 1934   SIMULATION AND TREATMENT PLANNING NOTE  DIAGNOSIS:     ICD-10-CM   1. Rectal cancer uT1uN0 s/p TEM partial proctectomy 01/06/2017 C20      The patient presented for simulation for the patient's upcoming course of radiation for the diagnosis of rectal cancer. The patient was placed in a supine position. A customized vac-lock bag was constructed to aid in patient immobilization on. This complex treatment device will be used on a daily basis during the treatment. In this fashion a CT scan was obtained through the pelvic region and the isocenter was placed near midline within the pelvis. Surface markings were placed.  The patient's imaging was loaded into the radiation treatment planning system. The patient will initially be planned to receive a course of radiation to a dose of 45 Gy. This will be accomplished in 25 fractions at 1.8 gray per fraction. This initial treatment will correspond to a 3-D conformal technique. The target has been contoured in addition to the rectum, bladder and femoral heads. Dose volume histograms of each of these structures have been requested and these will be carefully reviewed as part of the 3-D conformal treatment planning process. To accomplish this initial treatment, 4 customized blocks have been designed for this purpose. Each of these 4 complex treatment devices will be used on a daily basis during the initial course of the treatment. It is anticipated that the patient will then receive a boost for an additional 5.4 Gy. The anticipated total dose therefore will be 50.4 Gy.    Special treatment procedure The patient will receive chemotherapy during the course of radiation treatment. The patient may experience increased or overlapping toxicity due to this combined-modality approach and the patient will be monitored  for such problems. This may include extra lab work as necessary. This therefore constitutes a special treatment procedure.    ________________________________  Jodelle Gross, MD, PhD

## 2017-03-11 ENCOUNTER — Ambulatory Visit
Admission: RE | Admit: 2017-03-11 | Discharge: 2017-03-11 | Disposition: A | Discharge status: Home / Self Care | Payer: Medicare HMO | Source: Ambulatory Visit | Attending: Radiation Oncology | Admitting: Radiation Oncology

## 2017-03-11 ENCOUNTER — Ambulatory Visit: Payer: Medicare HMO

## 2017-03-11 DIAGNOSIS — Z9889 Other specified postprocedural states: Secondary | ICD-10-CM | POA: Diagnosis not present

## 2017-03-11 DIAGNOSIS — Z510 Encounter for antineoplastic radiation therapy: Secondary | ICD-10-CM | POA: Diagnosis not present

## 2017-03-11 DIAGNOSIS — E785 Hyperlipidemia, unspecified: Secondary | ICD-10-CM | POA: Diagnosis not present

## 2017-03-11 DIAGNOSIS — Z951 Presence of aortocoronary bypass graft: Secondary | ICD-10-CM | POA: Diagnosis not present

## 2017-03-11 DIAGNOSIS — Z87442 Personal history of urinary calculi: Secondary | ICD-10-CM | POA: Diagnosis not present

## 2017-03-11 DIAGNOSIS — C20 Malignant neoplasm of rectum: Secondary | ICD-10-CM | POA: Diagnosis not present

## 2017-03-11 DIAGNOSIS — F329 Major depressive disorder, single episode, unspecified: Secondary | ICD-10-CM | POA: Diagnosis not present

## 2017-03-11 DIAGNOSIS — E119 Type 2 diabetes mellitus without complications: Secondary | ICD-10-CM | POA: Diagnosis not present

## 2017-03-11 DIAGNOSIS — I2510 Atherosclerotic heart disease of native coronary artery without angina pectoris: Secondary | ICD-10-CM | POA: Diagnosis not present

## 2017-03-12 ENCOUNTER — Ambulatory Visit
Admission: RE | Admit: 2017-03-12 | Discharge: 2017-03-12 | Disposition: A | Discharge status: Home / Self Care | Payer: Medicare HMO | Source: Ambulatory Visit | Attending: Radiation Oncology | Admitting: Radiation Oncology

## 2017-03-12 DIAGNOSIS — F329 Major depressive disorder, single episode, unspecified: Secondary | ICD-10-CM | POA: Diagnosis not present

## 2017-03-12 DIAGNOSIS — I2510 Atherosclerotic heart disease of native coronary artery without angina pectoris: Secondary | ICD-10-CM | POA: Diagnosis not present

## 2017-03-12 DIAGNOSIS — E119 Type 2 diabetes mellitus without complications: Secondary | ICD-10-CM | POA: Diagnosis not present

## 2017-03-12 DIAGNOSIS — Z9889 Other specified postprocedural states: Secondary | ICD-10-CM | POA: Diagnosis not present

## 2017-03-12 DIAGNOSIS — Z510 Encounter for antineoplastic radiation therapy: Secondary | ICD-10-CM | POA: Diagnosis not present

## 2017-03-12 DIAGNOSIS — C20 Malignant neoplasm of rectum: Secondary | ICD-10-CM | POA: Diagnosis not present

## 2017-03-12 DIAGNOSIS — Z87442 Personal history of urinary calculi: Secondary | ICD-10-CM | POA: Diagnosis not present

## 2017-03-12 DIAGNOSIS — R300 Dysuria: Secondary | ICD-10-CM

## 2017-03-12 DIAGNOSIS — Z951 Presence of aortocoronary bypass graft: Secondary | ICD-10-CM | POA: Diagnosis not present

## 2017-03-12 DIAGNOSIS — E785 Hyperlipidemia, unspecified: Secondary | ICD-10-CM | POA: Diagnosis not present

## 2017-03-13 ENCOUNTER — Telehealth: Payer: Self-pay | Admitting: Oncology

## 2017-03-13 ENCOUNTER — Ambulatory Visit
Admission: RE | Admit: 2017-03-13 | Discharge: 2017-03-13 | Disposition: A | Discharge status: Home / Self Care | Payer: Medicare HMO | Source: Ambulatory Visit | Attending: Radiation Oncology | Admitting: Radiation Oncology

## 2017-03-13 DIAGNOSIS — C20 Malignant neoplasm of rectum: Secondary | ICD-10-CM | POA: Diagnosis not present

## 2017-03-13 DIAGNOSIS — E785 Hyperlipidemia, unspecified: Secondary | ICD-10-CM | POA: Diagnosis not present

## 2017-03-13 DIAGNOSIS — F329 Major depressive disorder, single episode, unspecified: Secondary | ICD-10-CM | POA: Diagnosis not present

## 2017-03-13 DIAGNOSIS — Z87442 Personal history of urinary calculi: Secondary | ICD-10-CM | POA: Diagnosis not present

## 2017-03-13 DIAGNOSIS — R300 Dysuria: Secondary | ICD-10-CM | POA: Diagnosis not present

## 2017-03-13 DIAGNOSIS — I2510 Atherosclerotic heart disease of native coronary artery without angina pectoris: Secondary | ICD-10-CM | POA: Diagnosis not present

## 2017-03-13 DIAGNOSIS — Z510 Encounter for antineoplastic radiation therapy: Secondary | ICD-10-CM | POA: Diagnosis not present

## 2017-03-13 DIAGNOSIS — Z951 Presence of aortocoronary bypass graft: Secondary | ICD-10-CM | POA: Diagnosis not present

## 2017-03-13 DIAGNOSIS — Z9889 Other specified postprocedural states: Secondary | ICD-10-CM | POA: Diagnosis not present

## 2017-03-13 DIAGNOSIS — E119 Type 2 diabetes mellitus without complications: Secondary | ICD-10-CM | POA: Diagnosis not present

## 2017-03-13 LAB — URINALYSIS, MICROSCOPIC - CHCC
BILIRUBIN (URINE): NEGATIVE
BLOOD: NEGATIVE
GLUCOSE UR CHCC: 2000 mg/dL
Ketones: NEGATIVE mg/dL
Leukocyte Esterase: NEGATIVE
NITRITE: NEGATIVE
PH: 6 (ref 4.6–8.0)
PROTEIN: NEGATIVE mg/dL
Specific Gravity, Urine: 1.015 (ref 1.003–1.035)
UROBILINOGEN UR: 0.2 mg/dL (ref 0.2–1)

## 2017-03-13 NOTE — Telephone Encounter (Signed)
Called patient and advised him that his urinalysis did not show any signs of infection per Dr. Lisbeth Renshaw.  He verbalized agreement and will let us know on Monday if he has any trouble urinating.

## 2017-03-14 LAB — URINE CULTURE

## 2017-03-16 ENCOUNTER — Ambulatory Visit
Admission: RE | Admit: 2017-03-16 | Discharge: 2017-03-16 | Disposition: A | Discharge status: Home / Self Care | Payer: Medicare HMO | Source: Ambulatory Visit | Attending: Radiation Oncology | Admitting: Radiation Oncology

## 2017-03-16 ENCOUNTER — Telehealth: Payer: Self-pay | Admitting: Oncology

## 2017-03-16 ENCOUNTER — Other Ambulatory Visit (HOSPITAL_BASED_OUTPATIENT_CLINIC_OR_DEPARTMENT_OTHER): Payer: Medicare HMO

## 2017-03-16 ENCOUNTER — Ambulatory Visit (HOSPITAL_BASED_OUTPATIENT_CLINIC_OR_DEPARTMENT_OTHER): Payer: Medicare HMO | Admitting: Nurse Practitioner

## 2017-03-16 VITALS — BP 160/67 | HR 76 | Temp 98.0°F | Resp 17 | Ht 71.0 in | Wt 162.0 lb

## 2017-03-16 DIAGNOSIS — R350 Frequency of micturition: Secondary | ICD-10-CM | POA: Diagnosis not present

## 2017-03-16 DIAGNOSIS — C20 Malignant neoplasm of rectum: Secondary | ICD-10-CM | POA: Diagnosis not present

## 2017-03-16 DIAGNOSIS — F329 Major depressive disorder, single episode, unspecified: Secondary | ICD-10-CM | POA: Diagnosis not present

## 2017-03-16 DIAGNOSIS — I2510 Atherosclerotic heart disease of native coronary artery without angina pectoris: Secondary | ICD-10-CM | POA: Diagnosis not present

## 2017-03-16 DIAGNOSIS — E119 Type 2 diabetes mellitus without complications: Secondary | ICD-10-CM | POA: Diagnosis not present

## 2017-03-16 DIAGNOSIS — E785 Hyperlipidemia, unspecified: Secondary | ICD-10-CM | POA: Diagnosis not present

## 2017-03-16 DIAGNOSIS — Z510 Encounter for antineoplastic radiation therapy: Secondary | ICD-10-CM | POA: Diagnosis not present

## 2017-03-16 DIAGNOSIS — Z87442 Personal history of urinary calculi: Secondary | ICD-10-CM | POA: Diagnosis not present

## 2017-03-16 DIAGNOSIS — Z951 Presence of aortocoronary bypass graft: Secondary | ICD-10-CM | POA: Diagnosis not present

## 2017-03-16 DIAGNOSIS — Z9889 Other specified postprocedural states: Secondary | ICD-10-CM | POA: Diagnosis not present

## 2017-03-16 LAB — CBC WITH DIFFERENTIAL/PLATELET
BASO%: 0.2 % (ref 0.0–2.0)
Basophils Absolute: 0 10*3/uL (ref 0.0–0.1)
EOS ABS: 0.2 10*3/uL (ref 0.0–0.5)
EOS%: 4.6 % (ref 0.0–7.0)
HCT: 40.6 % (ref 38.4–49.9)
HGB: 13.5 g/dL (ref 13.0–17.1)
LYMPH%: 17.8 % (ref 14.0–49.0)
MCH: 28.9 pg (ref 27.2–33.4)
MCHC: 33.3 g/dL (ref 32.0–36.0)
MCV: 86.9 fL (ref 79.3–98.0)
MONO#: 0.4 10*3/uL (ref 0.1–0.9)
MONO%: 9.7 % (ref 0.0–14.0)
NEUT#: 2.9 10*3/uL (ref 1.5–6.5)
NEUT%: 67.7 % (ref 39.0–75.0)
PLATELETS: 162 10*3/uL (ref 140–400)
RBC: 4.67 10*6/uL (ref 4.20–5.82)
RDW: 14 % (ref 11.0–14.6)
WBC: 4.3 10*3/uL (ref 4.0–10.3)
lymph#: 0.8 10*3/uL — ABNORMAL LOW (ref 0.9–3.3)

## 2017-03-16 NOTE — Progress Notes (Signed)
  [place] OFFICE PROGRESS NOTE   Diagnosis:  Rectal cancer  INTERVAL HISTORY:   Mr. Capri returns as scheduled. He began radiation and Xeloda 03/02/2017. He denies nausea/vomiting. No mouth sores. No diarrhea. No hand or foot pain or redness. Last week he began experiencing urinary frequency, urgency and dysuria. Some difficulty initiating the urine stream. He had a negative urinalysis 03/13/2017. Parital improvement in symptoms over the weekend.  Objective:  Vital signs in last 24 hours:  Blood pressure (!) 160/67, pulse 76, temperature 98 F (36.7 C), temperature source Oral, resp. rate 17, height 5\' 11"  (1.803 m), weight 162 lb (73.5 kg), SpO2 100 %.    HEENT: No thrush or ulcers. Resp: Lungs clear bilaterally. Cardio: Regular rate and rhythm. GI: Abdomen soft and nontender. No hepatomegaly. Vascular: No leg edema.  Skin: Palms without erythema.    Lab Results:  Lab Results  Component Value Date   WBC 4.3 03/16/2017   HGB 13.5 03/16/2017   HCT 40.6 03/16/2017   MCV 86.9 03/16/2017   PLT 162 03/16/2017   NEUTROABS 2.9 03/16/2017    Imaging:  No results found.  Medications: I have reviewed the patient's current medications.  Assessment/Plan: 1. Rectal cancer, colonoscopy 11/24/2016 revealed a rectal mass extending to the dentate line, biopsy confirmed invasive adenocarcinoma ? CTs of the chest, abdomen, and pelvis 12/04/2016-negative for metastatic disease, no lymphadenopathy, rectal mass visualized ? EUS 12/18/2016,uT1N0tumor with the distal edge at 0.5 cm from the anal verge ? Transanal excision 01/06/2017-T2, no lymph nodes submitted, lymphovascular invasion noted ? Initiation of radiation/Xeloda 03/02/2017 2. Descending and sigmoid polyps removed at colonoscopy 11/24/2016-tubular adenomas  3. History of coronary artery disease, status post coronary artery bypass surgery  4. Diabetes  5.   Urinary frequency, urgency, dysuria  with negative urinalysis/culture 03/13/2017   Disposition: Mr. Webb appears stable. He continues radiation/Xeloda. He seems to be tolerating the Xeloda well.   He has developed urinary frequency, urgency and dysuria as well as difficulty initiating the urine stream. He had a negative urinalysis and culture on 03/13/2017. Question if symptoms are related to radiation. We are contacting his urologist to request evaluation.  We scheduled a return visit here in 2 weeks. He will contact the office in the interim with any problems.  Plan reviewed with Dr. Benay Spice. 25 minutes were spent face-to-face at today's visit with the majority of that time involved in counseling/coordination of care.  [place], [place] ANP/GNP-BC   03/16/2017  10:13 AM

## 2017-03-16 NOTE — Progress Notes (Signed)
TCT Dr. Estill Dooms office (Urologist) in [place] to schedule an urgent appt for patient for either today or tomorrow.  Earliest appt is Wednesday, 03/18/17. The office will contact patient with exact date and time.  They will also notify this office of appt time. Discussed the above with patient and gave him directions and that office's phone #    Pt asked if his symptoms got worse, could he go to [facility] ED. Assured pt that would the appropriate step to take.  Will fax office notes to Dr. Lorinda Creed office when available   [phone]

## 2017-03-16 NOTE — Telephone Encounter (Signed)
Gave avs and calendar for september °

## 2017-03-17 ENCOUNTER — Other Ambulatory Visit: Payer: Self-pay | Admitting: Emergency Medicine

## 2017-03-17 ENCOUNTER — Ambulatory Visit
Admission: RE | Admit: 2017-03-17 | Discharge: 2017-03-17 | Disposition: A | Discharge status: Home / Self Care | Payer: Medicare HMO | Source: Ambulatory Visit | Attending: Radiation Oncology | Admitting: Radiation Oncology

## 2017-03-17 DIAGNOSIS — R339 Retention of urine, unspecified: Secondary | ICD-10-CM

## 2017-03-17 DIAGNOSIS — Z951 Presence of aortocoronary bypass graft: Secondary | ICD-10-CM | POA: Diagnosis not present

## 2017-03-17 DIAGNOSIS — Z510 Encounter for antineoplastic radiation therapy: Secondary | ICD-10-CM | POA: Diagnosis not present

## 2017-03-17 DIAGNOSIS — Z9889 Other specified postprocedural states: Secondary | ICD-10-CM | POA: Diagnosis not present

## 2017-03-17 DIAGNOSIS — I2510 Atherosclerotic heart disease of native coronary artery without angina pectoris: Secondary | ICD-10-CM | POA: Diagnosis not present

## 2017-03-17 DIAGNOSIS — F329 Major depressive disorder, single episode, unspecified: Secondary | ICD-10-CM | POA: Diagnosis not present

## 2017-03-17 DIAGNOSIS — Z87442 Personal history of urinary calculi: Secondary | ICD-10-CM | POA: Diagnosis not present

## 2017-03-17 DIAGNOSIS — E119 Type 2 diabetes mellitus without complications: Secondary | ICD-10-CM | POA: Diagnosis not present

## 2017-03-17 DIAGNOSIS — C20 Malignant neoplasm of rectum: Secondary | ICD-10-CM | POA: Diagnosis not present

## 2017-03-17 DIAGNOSIS — E785 Hyperlipidemia, unspecified: Secondary | ICD-10-CM | POA: Diagnosis not present

## 2017-03-17 NOTE — Progress Notes (Signed)
Mr. Brendan Meyer came in this morning  stating that he tired to void four times last night and could not. Stated that he voided a little this moring before he came to his appointment. Brendan Meyer voided 25cc when when he was down here in radiation oncology. Dr.Moody placed an ordered for a foley to be insert once he urinated to see what his urinary residual would be.Tried to place a  foley was unsuccessful. Pt stated that he had issues with foley's being placed in the past. Stated that only a urologist was able to insert it. Made an appointment for him this morning  to see an urologist Dr. Louis Meyer at [place].

## 2017-03-18 ENCOUNTER — Ambulatory Visit
Admission: RE | Admit: 2017-03-18 | Discharge: 2017-03-18 | Disposition: A | Discharge status: Home / Self Care | Payer: Medicare HMO | Source: Ambulatory Visit | Attending: Radiation Oncology | Admitting: Radiation Oncology

## 2017-03-18 DIAGNOSIS — F329 Major depressive disorder, single episode, unspecified: Secondary | ICD-10-CM | POA: Diagnosis not present

## 2017-03-18 DIAGNOSIS — Z9889 Other specified postprocedural states: Secondary | ICD-10-CM | POA: Diagnosis not present

## 2017-03-18 DIAGNOSIS — Z87442 Personal history of urinary calculi: Secondary | ICD-10-CM | POA: Diagnosis not present

## 2017-03-18 DIAGNOSIS — E119 Type 2 diabetes mellitus without complications: Secondary | ICD-10-CM | POA: Diagnosis not present

## 2017-03-18 DIAGNOSIS — Z510 Encounter for antineoplastic radiation therapy: Secondary | ICD-10-CM | POA: Diagnosis not present

## 2017-03-18 DIAGNOSIS — E785 Hyperlipidemia, unspecified: Secondary | ICD-10-CM | POA: Diagnosis not present

## 2017-03-18 DIAGNOSIS — C20 Malignant neoplasm of rectum: Secondary | ICD-10-CM | POA: Diagnosis not present

## 2017-03-18 DIAGNOSIS — Z951 Presence of aortocoronary bypass graft: Secondary | ICD-10-CM | POA: Diagnosis not present

## 2017-03-18 DIAGNOSIS — I2510 Atherosclerotic heart disease of native coronary artery without angina pectoris: Secondary | ICD-10-CM | POA: Diagnosis not present

## 2017-03-19 ENCOUNTER — Ambulatory Visit
Admission: RE | Admit: 2017-03-19 | Discharge: 2017-03-19 | Disposition: A | Discharge status: Home / Self Care | Payer: Medicare HMO | Source: Ambulatory Visit | Attending: Radiation Oncology | Admitting: Radiation Oncology

## 2017-03-19 DIAGNOSIS — E785 Hyperlipidemia, unspecified: Secondary | ICD-10-CM | POA: Diagnosis not present

## 2017-03-19 DIAGNOSIS — Z951 Presence of aortocoronary bypass graft: Secondary | ICD-10-CM | POA: Diagnosis not present

## 2017-03-19 DIAGNOSIS — E119 Type 2 diabetes mellitus without complications: Secondary | ICD-10-CM | POA: Diagnosis not present

## 2017-03-19 DIAGNOSIS — C20 Malignant neoplasm of rectum: Secondary | ICD-10-CM | POA: Diagnosis not present

## 2017-03-19 DIAGNOSIS — F329 Major depressive disorder, single episode, unspecified: Secondary | ICD-10-CM | POA: Diagnosis not present

## 2017-03-19 DIAGNOSIS — Z87442 Personal history of urinary calculi: Secondary | ICD-10-CM | POA: Diagnosis not present

## 2017-03-19 DIAGNOSIS — I2510 Atherosclerotic heart disease of native coronary artery without angina pectoris: Secondary | ICD-10-CM | POA: Diagnosis not present

## 2017-03-19 DIAGNOSIS — Z510 Encounter for antineoplastic radiation therapy: Secondary | ICD-10-CM | POA: Diagnosis not present

## 2017-03-19 DIAGNOSIS — Z9889 Other specified postprocedural states: Secondary | ICD-10-CM | POA: Diagnosis not present

## 2017-03-20 ENCOUNTER — Ambulatory Visit
Admission: RE | Admit: 2017-03-20 | Discharge: 2017-03-20 | Disposition: A | Discharge status: Home / Self Care | Payer: Medicare HMO | Source: Ambulatory Visit | Attending: Radiation Oncology | Admitting: Radiation Oncology

## 2017-03-20 DIAGNOSIS — Z87442 Personal history of urinary calculi: Secondary | ICD-10-CM | POA: Diagnosis not present

## 2017-03-20 DIAGNOSIS — Z510 Encounter for antineoplastic radiation therapy: Secondary | ICD-10-CM | POA: Diagnosis not present

## 2017-03-20 DIAGNOSIS — E119 Type 2 diabetes mellitus without complications: Secondary | ICD-10-CM | POA: Diagnosis not present

## 2017-03-20 DIAGNOSIS — Z951 Presence of aortocoronary bypass graft: Secondary | ICD-10-CM | POA: Diagnosis not present

## 2017-03-20 DIAGNOSIS — C20 Malignant neoplasm of rectum: Secondary | ICD-10-CM | POA: Diagnosis not present

## 2017-03-20 DIAGNOSIS — Z9889 Other specified postprocedural states: Secondary | ICD-10-CM | POA: Diagnosis not present

## 2017-03-20 DIAGNOSIS — E785 Hyperlipidemia, unspecified: Secondary | ICD-10-CM | POA: Diagnosis not present

## 2017-03-20 DIAGNOSIS — I2510 Atherosclerotic heart disease of native coronary artery without angina pectoris: Secondary | ICD-10-CM | POA: Diagnosis not present

## 2017-03-20 DIAGNOSIS — F329 Major depressive disorder, single episode, unspecified: Secondary | ICD-10-CM | POA: Diagnosis not present

## 2017-03-23 ENCOUNTER — Ambulatory Visit
Admission: RE | Admit: 2017-03-23 | Discharge: 2017-03-23 | Disposition: A | Discharge status: Home / Self Care | Payer: Medicare HMO | Source: Ambulatory Visit | Attending: Radiation Oncology | Admitting: Radiation Oncology

## 2017-03-23 DIAGNOSIS — C20 Malignant neoplasm of rectum: Secondary | ICD-10-CM | POA: Diagnosis not present

## 2017-03-23 DIAGNOSIS — Z9889 Other specified postprocedural states: Secondary | ICD-10-CM | POA: Diagnosis not present

## 2017-03-23 DIAGNOSIS — F329 Major depressive disorder, single episode, unspecified: Secondary | ICD-10-CM | POA: Diagnosis not present

## 2017-03-23 DIAGNOSIS — Z951 Presence of aortocoronary bypass graft: Secondary | ICD-10-CM | POA: Diagnosis not present

## 2017-03-23 DIAGNOSIS — Z87442 Personal history of urinary calculi: Secondary | ICD-10-CM | POA: Diagnosis not present

## 2017-03-23 DIAGNOSIS — I2510 Atherosclerotic heart disease of native coronary artery without angina pectoris: Secondary | ICD-10-CM | POA: Diagnosis not present

## 2017-03-23 DIAGNOSIS — E119 Type 2 diabetes mellitus without complications: Secondary | ICD-10-CM | POA: Diagnosis not present

## 2017-03-23 DIAGNOSIS — Z510 Encounter for antineoplastic radiation therapy: Secondary | ICD-10-CM | POA: Diagnosis not present

## 2017-03-23 DIAGNOSIS — E785 Hyperlipidemia, unspecified: Secondary | ICD-10-CM | POA: Diagnosis not present

## 2017-03-24 ENCOUNTER — Ambulatory Visit
Admission: RE | Admit: 2017-03-24 | Discharge: 2017-03-24 | Disposition: A | Discharge status: Home / Self Care | Payer: Medicare HMO | Source: Ambulatory Visit | Attending: Radiation Oncology | Admitting: Radiation Oncology

## 2017-03-24 DIAGNOSIS — E785 Hyperlipidemia, unspecified: Secondary | ICD-10-CM | POA: Diagnosis not present

## 2017-03-24 DIAGNOSIS — Z9889 Other specified postprocedural states: Secondary | ICD-10-CM | POA: Diagnosis not present

## 2017-03-24 DIAGNOSIS — Z951 Presence of aortocoronary bypass graft: Secondary | ICD-10-CM | POA: Diagnosis not present

## 2017-03-24 DIAGNOSIS — Z510 Encounter for antineoplastic radiation therapy: Secondary | ICD-10-CM | POA: Diagnosis not present

## 2017-03-24 DIAGNOSIS — Z87442 Personal history of urinary calculi: Secondary | ICD-10-CM | POA: Diagnosis not present

## 2017-03-24 DIAGNOSIS — C20 Malignant neoplasm of rectum: Secondary | ICD-10-CM | POA: Diagnosis not present

## 2017-03-24 DIAGNOSIS — I2510 Atherosclerotic heart disease of native coronary artery without angina pectoris: Secondary | ICD-10-CM | POA: Diagnosis not present

## 2017-03-24 DIAGNOSIS — F329 Major depressive disorder, single episode, unspecified: Secondary | ICD-10-CM | POA: Diagnosis not present

## 2017-03-24 DIAGNOSIS — E119 Type 2 diabetes mellitus without complications: Secondary | ICD-10-CM | POA: Diagnosis not present

## 2017-03-25 ENCOUNTER — Ambulatory Visit
Admission: RE | Admit: 2017-03-25 | Discharge: 2017-03-25 | Disposition: A | Discharge status: Home / Self Care | Payer: Medicare HMO | Source: Ambulatory Visit | Attending: Radiation Oncology | Admitting: Radiation Oncology

## 2017-03-25 DIAGNOSIS — Z87442 Personal history of urinary calculi: Secondary | ICD-10-CM | POA: Diagnosis not present

## 2017-03-25 DIAGNOSIS — Z9889 Other specified postprocedural states: Secondary | ICD-10-CM | POA: Diagnosis not present

## 2017-03-25 DIAGNOSIS — I2510 Atherosclerotic heart disease of native coronary artery without angina pectoris: Secondary | ICD-10-CM | POA: Diagnosis not present

## 2017-03-25 DIAGNOSIS — E119 Type 2 diabetes mellitus without complications: Secondary | ICD-10-CM | POA: Diagnosis not present

## 2017-03-25 DIAGNOSIS — Z951 Presence of aortocoronary bypass graft: Secondary | ICD-10-CM | POA: Diagnosis not present

## 2017-03-25 DIAGNOSIS — F329 Major depressive disorder, single episode, unspecified: Secondary | ICD-10-CM | POA: Diagnosis not present

## 2017-03-25 DIAGNOSIS — Z510 Encounter for antineoplastic radiation therapy: Secondary | ICD-10-CM | POA: Diagnosis not present

## 2017-03-25 DIAGNOSIS — E785 Hyperlipidemia, unspecified: Secondary | ICD-10-CM | POA: Diagnosis not present

## 2017-03-25 DIAGNOSIS — C20 Malignant neoplasm of rectum: Secondary | ICD-10-CM | POA: Diagnosis not present

## 2017-03-26 ENCOUNTER — Ambulatory Visit
Admission: RE | Admit: 2017-03-26 | Discharge: 2017-03-26 | Disposition: A | Discharge status: Home / Self Care | Payer: Medicare HMO | Source: Ambulatory Visit | Attending: Radiation Oncology | Admitting: Radiation Oncology

## 2017-03-26 DIAGNOSIS — E119 Type 2 diabetes mellitus without complications: Secondary | ICD-10-CM | POA: Diagnosis not present

## 2017-03-26 DIAGNOSIS — Z510 Encounter for antineoplastic radiation therapy: Secondary | ICD-10-CM | POA: Diagnosis not present

## 2017-03-26 DIAGNOSIS — I2510 Atherosclerotic heart disease of native coronary artery without angina pectoris: Secondary | ICD-10-CM | POA: Diagnosis not present

## 2017-03-26 DIAGNOSIS — Z951 Presence of aortocoronary bypass graft: Secondary | ICD-10-CM | POA: Diagnosis not present

## 2017-03-26 DIAGNOSIS — F329 Major depressive disorder, single episode, unspecified: Secondary | ICD-10-CM | POA: Diagnosis not present

## 2017-03-26 DIAGNOSIS — E785 Hyperlipidemia, unspecified: Secondary | ICD-10-CM | POA: Diagnosis not present

## 2017-03-26 DIAGNOSIS — C20 Malignant neoplasm of rectum: Secondary | ICD-10-CM | POA: Diagnosis not present

## 2017-03-26 DIAGNOSIS — Z9889 Other specified postprocedural states: Secondary | ICD-10-CM | POA: Diagnosis not present

## 2017-03-26 DIAGNOSIS — Z87442 Personal history of urinary calculi: Secondary | ICD-10-CM | POA: Diagnosis not present

## 2017-03-27 ENCOUNTER — Ambulatory Visit
Admission: RE | Admit: 2017-03-27 | Discharge: 2017-03-27 | Disposition: A | Discharge status: Home / Self Care | Payer: Medicare HMO | Source: Ambulatory Visit | Attending: Radiation Oncology | Admitting: Radiation Oncology

## 2017-03-27 DIAGNOSIS — F329 Major depressive disorder, single episode, unspecified: Secondary | ICD-10-CM | POA: Diagnosis not present

## 2017-03-27 DIAGNOSIS — E119 Type 2 diabetes mellitus without complications: Secondary | ICD-10-CM | POA: Diagnosis not present

## 2017-03-27 DIAGNOSIS — Z9889 Other specified postprocedural states: Secondary | ICD-10-CM | POA: Diagnosis not present

## 2017-03-27 DIAGNOSIS — Z951 Presence of aortocoronary bypass graft: Secondary | ICD-10-CM | POA: Diagnosis not present

## 2017-03-27 DIAGNOSIS — E785 Hyperlipidemia, unspecified: Secondary | ICD-10-CM | POA: Diagnosis not present

## 2017-03-27 DIAGNOSIS — Z87442 Personal history of urinary calculi: Secondary | ICD-10-CM | POA: Diagnosis not present

## 2017-03-27 DIAGNOSIS — Z510 Encounter for antineoplastic radiation therapy: Secondary | ICD-10-CM | POA: Diagnosis not present

## 2017-03-27 DIAGNOSIS — I2510 Atherosclerotic heart disease of native coronary artery without angina pectoris: Secondary | ICD-10-CM | POA: Diagnosis not present

## 2017-03-27 DIAGNOSIS — C20 Malignant neoplasm of rectum: Secondary | ICD-10-CM | POA: Diagnosis not present

## 2017-03-30 ENCOUNTER — Other Ambulatory Visit: Payer: Medicare HMO

## 2017-03-30 ENCOUNTER — Ambulatory Visit: Payer: Medicare HMO | Admitting: Oncology

## 2017-03-30 ENCOUNTER — Ambulatory Visit
Admission: RE | Admit: 2017-03-30 | Discharge: 2017-03-30 | Disposition: A | Discharge status: Home / Self Care | Payer: Medicare HMO | Source: Ambulatory Visit | Attending: Radiation Oncology | Admitting: Radiation Oncology

## 2017-03-30 DIAGNOSIS — E119 Type 2 diabetes mellitus without complications: Secondary | ICD-10-CM | POA: Diagnosis not present

## 2017-03-30 DIAGNOSIS — C20 Malignant neoplasm of rectum: Secondary | ICD-10-CM | POA: Diagnosis not present

## 2017-03-30 DIAGNOSIS — Z510 Encounter for antineoplastic radiation therapy: Secondary | ICD-10-CM | POA: Diagnosis not present

## 2017-03-30 DIAGNOSIS — Z9889 Other specified postprocedural states: Secondary | ICD-10-CM | POA: Diagnosis not present

## 2017-03-30 DIAGNOSIS — Z87442 Personal history of urinary calculi: Secondary | ICD-10-CM | POA: Diagnosis not present

## 2017-03-30 DIAGNOSIS — E785 Hyperlipidemia, unspecified: Secondary | ICD-10-CM | POA: Diagnosis not present

## 2017-03-30 DIAGNOSIS — I2510 Atherosclerotic heart disease of native coronary artery without angina pectoris: Secondary | ICD-10-CM | POA: Diagnosis not present

## 2017-03-30 DIAGNOSIS — F329 Major depressive disorder, single episode, unspecified: Secondary | ICD-10-CM | POA: Diagnosis not present

## 2017-03-30 DIAGNOSIS — Z951 Presence of aortocoronary bypass graft: Secondary | ICD-10-CM | POA: Diagnosis not present

## 2017-03-31 ENCOUNTER — Ambulatory Visit
Admission: RE | Admit: 2017-03-31 | Discharge: 2017-03-31 | Disposition: A | Discharge status: Home / Self Care | Payer: Medicare HMO | Source: Ambulatory Visit | Attending: Radiation Oncology | Admitting: Radiation Oncology

## 2017-03-31 DIAGNOSIS — E119 Type 2 diabetes mellitus without complications: Secondary | ICD-10-CM | POA: Diagnosis not present

## 2017-03-31 DIAGNOSIS — I2510 Atherosclerotic heart disease of native coronary artery without angina pectoris: Secondary | ICD-10-CM | POA: Diagnosis not present

## 2017-03-31 DIAGNOSIS — Z510 Encounter for antineoplastic radiation therapy: Secondary | ICD-10-CM | POA: Diagnosis not present

## 2017-03-31 DIAGNOSIS — Z9889 Other specified postprocedural states: Secondary | ICD-10-CM | POA: Diagnosis not present

## 2017-03-31 DIAGNOSIS — Z951 Presence of aortocoronary bypass graft: Secondary | ICD-10-CM | POA: Diagnosis not present

## 2017-03-31 DIAGNOSIS — C20 Malignant neoplasm of rectum: Secondary | ICD-10-CM | POA: Diagnosis not present

## 2017-03-31 DIAGNOSIS — E785 Hyperlipidemia, unspecified: Secondary | ICD-10-CM | POA: Diagnosis not present

## 2017-03-31 DIAGNOSIS — Z87442 Personal history of urinary calculi: Secondary | ICD-10-CM | POA: Diagnosis not present

## 2017-03-31 DIAGNOSIS — F329 Major depressive disorder, single episode, unspecified: Secondary | ICD-10-CM | POA: Diagnosis not present

## 2017-04-01 ENCOUNTER — Ambulatory Visit
Admission: RE | Admit: 2017-04-01 | Discharge: 2017-04-01 | Disposition: A | Discharge status: Home / Self Care | Payer: Medicare HMO | Source: Ambulatory Visit | Attending: Radiation Oncology | Admitting: Radiation Oncology

## 2017-04-01 DIAGNOSIS — Z951 Presence of aortocoronary bypass graft: Secondary | ICD-10-CM | POA: Diagnosis not present

## 2017-04-01 DIAGNOSIS — I2510 Atherosclerotic heart disease of native coronary artery without angina pectoris: Secondary | ICD-10-CM | POA: Diagnosis not present

## 2017-04-01 DIAGNOSIS — E785 Hyperlipidemia, unspecified: Secondary | ICD-10-CM | POA: Diagnosis not present

## 2017-04-01 DIAGNOSIS — F329 Major depressive disorder, single episode, unspecified: Secondary | ICD-10-CM | POA: Diagnosis not present

## 2017-04-01 DIAGNOSIS — E119 Type 2 diabetes mellitus without complications: Secondary | ICD-10-CM | POA: Diagnosis not present

## 2017-04-01 DIAGNOSIS — Z87442 Personal history of urinary calculi: Secondary | ICD-10-CM | POA: Diagnosis not present

## 2017-04-01 DIAGNOSIS — C20 Malignant neoplasm of rectum: Secondary | ICD-10-CM | POA: Diagnosis not present

## 2017-04-01 DIAGNOSIS — Z9889 Other specified postprocedural states: Secondary | ICD-10-CM | POA: Diagnosis not present

## 2017-04-01 DIAGNOSIS — Z510 Encounter for antineoplastic radiation therapy: Secondary | ICD-10-CM | POA: Diagnosis not present

## 2017-04-01 MED FILL — CAPECITABINE 500 MG TABLET: 500 | 8 days supply | Qty: 36 | Fill #1

## 2017-04-02 ENCOUNTER — Ambulatory Visit
Admission: RE | Admit: 2017-04-02 | Discharge: 2017-04-02 | Disposition: A | Discharge status: Home / Self Care | Payer: Medicare HMO | Source: Ambulatory Visit | Attending: Radiation Oncology | Admitting: Radiation Oncology

## 2017-04-02 DIAGNOSIS — E785 Hyperlipidemia, unspecified: Secondary | ICD-10-CM | POA: Diagnosis not present

## 2017-04-02 DIAGNOSIS — C20 Malignant neoplasm of rectum: Secondary | ICD-10-CM | POA: Diagnosis not present

## 2017-04-02 DIAGNOSIS — I2510 Atherosclerotic heart disease of native coronary artery without angina pectoris: Secondary | ICD-10-CM | POA: Diagnosis not present

## 2017-04-02 DIAGNOSIS — Z9889 Other specified postprocedural states: Secondary | ICD-10-CM | POA: Diagnosis not present

## 2017-04-02 DIAGNOSIS — Z87442 Personal history of urinary calculi: Secondary | ICD-10-CM | POA: Diagnosis not present

## 2017-04-02 DIAGNOSIS — E119 Type 2 diabetes mellitus without complications: Secondary | ICD-10-CM | POA: Diagnosis not present

## 2017-04-02 DIAGNOSIS — Z951 Presence of aortocoronary bypass graft: Secondary | ICD-10-CM | POA: Diagnosis not present

## 2017-04-02 DIAGNOSIS — Z510 Encounter for antineoplastic radiation therapy: Secondary | ICD-10-CM | POA: Diagnosis not present

## 2017-04-02 DIAGNOSIS — F329 Major depressive disorder, single episode, unspecified: Secondary | ICD-10-CM | POA: Diagnosis not present

## 2017-04-03 ENCOUNTER — Ambulatory Visit
Admission: RE | Admit: 2017-04-03 | Discharge: 2017-04-03 | Disposition: A | Discharge status: Home / Self Care | Payer: Medicare HMO | Source: Ambulatory Visit | Attending: Radiation Oncology | Admitting: Radiation Oncology

## 2017-04-03 DIAGNOSIS — I2510 Atherosclerotic heart disease of native coronary artery without angina pectoris: Secondary | ICD-10-CM | POA: Diagnosis not present

## 2017-04-03 DIAGNOSIS — Z510 Encounter for antineoplastic radiation therapy: Secondary | ICD-10-CM | POA: Diagnosis not present

## 2017-04-03 DIAGNOSIS — Z951 Presence of aortocoronary bypass graft: Secondary | ICD-10-CM | POA: Diagnosis not present

## 2017-04-03 DIAGNOSIS — C20 Malignant neoplasm of rectum: Secondary | ICD-10-CM | POA: Diagnosis not present

## 2017-04-03 DIAGNOSIS — F329 Major depressive disorder, single episode, unspecified: Secondary | ICD-10-CM | POA: Diagnosis not present

## 2017-04-03 DIAGNOSIS — E785 Hyperlipidemia, unspecified: Secondary | ICD-10-CM | POA: Diagnosis not present

## 2017-04-03 DIAGNOSIS — Z9889 Other specified postprocedural states: Secondary | ICD-10-CM | POA: Diagnosis not present

## 2017-04-03 DIAGNOSIS — E119 Type 2 diabetes mellitus without complications: Secondary | ICD-10-CM | POA: Diagnosis not present

## 2017-04-03 DIAGNOSIS — Z87442 Personal history of urinary calculi: Secondary | ICD-10-CM | POA: Diagnosis not present

## 2017-04-06 ENCOUNTER — Ambulatory Visit
Admission: RE | Admit: 2017-04-06 | Discharge: 2017-04-06 | Disposition: A | Discharge status: Home / Self Care | Payer: Medicare HMO | Source: Ambulatory Visit | Attending: Radiation Oncology | Admitting: Radiation Oncology

## 2017-04-06 DIAGNOSIS — Z9889 Other specified postprocedural states: Secondary | ICD-10-CM | POA: Diagnosis not present

## 2017-04-06 DIAGNOSIS — I2510 Atherosclerotic heart disease of native coronary artery without angina pectoris: Secondary | ICD-10-CM | POA: Diagnosis not present

## 2017-04-06 DIAGNOSIS — C20 Malignant neoplasm of rectum: Secondary | ICD-10-CM | POA: Diagnosis not present

## 2017-04-06 DIAGNOSIS — F329 Major depressive disorder, single episode, unspecified: Secondary | ICD-10-CM | POA: Diagnosis not present

## 2017-04-06 DIAGNOSIS — Z951 Presence of aortocoronary bypass graft: Secondary | ICD-10-CM | POA: Diagnosis not present

## 2017-04-06 DIAGNOSIS — E785 Hyperlipidemia, unspecified: Secondary | ICD-10-CM | POA: Diagnosis not present

## 2017-04-06 DIAGNOSIS — E119 Type 2 diabetes mellitus without complications: Secondary | ICD-10-CM | POA: Diagnosis not present

## 2017-04-06 DIAGNOSIS — Z87442 Personal history of urinary calculi: Secondary | ICD-10-CM | POA: Diagnosis not present

## 2017-04-06 DIAGNOSIS — Z510 Encounter for antineoplastic radiation therapy: Secondary | ICD-10-CM | POA: Diagnosis not present

## 2017-04-07 ENCOUNTER — Ambulatory Visit
Admission: RE | Admit: 2017-04-07 | Discharge: 2017-04-07 | Disposition: A | Discharge status: Home / Self Care | Payer: Medicare HMO | Source: Ambulatory Visit | Attending: Radiation Oncology | Admitting: Radiation Oncology

## 2017-04-07 DIAGNOSIS — Z87442 Personal history of urinary calculi: Secondary | ICD-10-CM | POA: Diagnosis not present

## 2017-04-07 DIAGNOSIS — F329 Major depressive disorder, single episode, unspecified: Secondary | ICD-10-CM | POA: Diagnosis not present

## 2017-04-07 DIAGNOSIS — E785 Hyperlipidemia, unspecified: Secondary | ICD-10-CM | POA: Diagnosis not present

## 2017-04-07 DIAGNOSIS — Z951 Presence of aortocoronary bypass graft: Secondary | ICD-10-CM | POA: Diagnosis not present

## 2017-04-07 DIAGNOSIS — I2510 Atherosclerotic heart disease of native coronary artery without angina pectoris: Secondary | ICD-10-CM | POA: Diagnosis not present

## 2017-04-07 DIAGNOSIS — C20 Malignant neoplasm of rectum: Secondary | ICD-10-CM | POA: Diagnosis not present

## 2017-04-07 DIAGNOSIS — Z510 Encounter for antineoplastic radiation therapy: Secondary | ICD-10-CM | POA: Diagnosis not present

## 2017-04-07 DIAGNOSIS — Z9889 Other specified postprocedural states: Secondary | ICD-10-CM | POA: Diagnosis not present

## 2017-04-07 DIAGNOSIS — E119 Type 2 diabetes mellitus without complications: Secondary | ICD-10-CM | POA: Diagnosis not present

## 2017-04-08 ENCOUNTER — Ambulatory Visit
Admission: RE | Admit: 2017-04-08 | Discharge: 2017-04-08 | Disposition: A | Discharge status: Home / Self Care | Payer: Medicare HMO | Source: Ambulatory Visit | Attending: Radiation Oncology | Admitting: Radiation Oncology

## 2017-04-08 DIAGNOSIS — Z9889 Other specified postprocedural states: Secondary | ICD-10-CM | POA: Diagnosis not present

## 2017-04-08 DIAGNOSIS — Z951 Presence of aortocoronary bypass graft: Secondary | ICD-10-CM | POA: Diagnosis not present

## 2017-04-08 DIAGNOSIS — I2510 Atherosclerotic heart disease of native coronary artery without angina pectoris: Secondary | ICD-10-CM | POA: Diagnosis not present

## 2017-04-08 DIAGNOSIS — E785 Hyperlipidemia, unspecified: Secondary | ICD-10-CM | POA: Diagnosis not present

## 2017-04-08 DIAGNOSIS — Z510 Encounter for antineoplastic radiation therapy: Secondary | ICD-10-CM | POA: Diagnosis not present

## 2017-04-08 DIAGNOSIS — E119 Type 2 diabetes mellitus without complications: Secondary | ICD-10-CM | POA: Diagnosis not present

## 2017-04-08 DIAGNOSIS — Z87442 Personal history of urinary calculi: Secondary | ICD-10-CM | POA: Diagnosis not present

## 2017-04-08 DIAGNOSIS — F329 Major depressive disorder, single episode, unspecified: Secondary | ICD-10-CM | POA: Diagnosis not present

## 2017-04-08 DIAGNOSIS — C20 Malignant neoplasm of rectum: Secondary | ICD-10-CM | POA: Diagnosis not present

## 2017-04-09 ENCOUNTER — Ambulatory Visit
Admission: RE | Admit: 2017-04-09 | Discharge: 2017-04-09 | Disposition: A | Discharge status: Home / Self Care | Payer: Medicare HMO | Source: Ambulatory Visit | Attending: Radiation Oncology | Admitting: Radiation Oncology

## 2017-04-09 DIAGNOSIS — F329 Major depressive disorder, single episode, unspecified: Secondary | ICD-10-CM | POA: Diagnosis not present

## 2017-04-09 DIAGNOSIS — Z510 Encounter for antineoplastic radiation therapy: Secondary | ICD-10-CM | POA: Diagnosis not present

## 2017-04-09 DIAGNOSIS — Z9889 Other specified postprocedural states: Secondary | ICD-10-CM | POA: Diagnosis not present

## 2017-04-09 DIAGNOSIS — C20 Malignant neoplasm of rectum: Secondary | ICD-10-CM | POA: Diagnosis not present

## 2017-04-09 DIAGNOSIS — Z87442 Personal history of urinary calculi: Secondary | ICD-10-CM | POA: Diagnosis not present

## 2017-04-09 DIAGNOSIS — E785 Hyperlipidemia, unspecified: Secondary | ICD-10-CM | POA: Diagnosis not present

## 2017-04-09 DIAGNOSIS — E119 Type 2 diabetes mellitus without complications: Secondary | ICD-10-CM | POA: Diagnosis not present

## 2017-04-09 DIAGNOSIS — Z951 Presence of aortocoronary bypass graft: Secondary | ICD-10-CM | POA: Diagnosis not present

## 2017-04-09 DIAGNOSIS — I2510 Atherosclerotic heart disease of native coronary artery without angina pectoris: Secondary | ICD-10-CM | POA: Diagnosis not present

## 2017-04-15 ENCOUNTER — Encounter: Payer: Self-pay | Admitting: Radiation Oncology

## 2017-04-15 DIAGNOSIS — R8290 Unspecified abnormal findings in urine: Secondary | ICD-10-CM | POA: Diagnosis not present

## 2017-04-15 DIAGNOSIS — R339 Retention of urine, unspecified: Secondary | ICD-10-CM | POA: Diagnosis not present

## 2017-04-15 NOTE — Progress Notes (Signed)
  Radiation Oncology         [phone] ________________________________  Name: Brendan Meyer MRN: [id number]  Date: 04/15/2017  DOB: Feb 13, 1935  End of Treatment Note  Diagnosis:   C20 Malignant neoplasm of rectum     Indication for treatment:  Curative       Radiation treatment dates:   03/02/2017 - 04/09/2017  Site/dose:   1. Rectum/ 1.8Gy X 25 fractions           2. Rectum_Bst/ 1.8Gy X 3 fractions  Beams/energy:   1. 3D/ 15X, 6X,         2. 3D/ 15X, 6X  Narrative: The patient tolerated radiation treatment relatively well. Pt became more fatigued as his treatment proceeded. He noted that his appetite was "okay" throughout, resulting in some weight loss. He had a foley placed, but still experienced occasional dysuria. By the end of treatment he reported erythema to his rectum, but denied any blood in his stools. Pt also denied any other pain or nausea.  Plan: The patient has completed radiation treatment. The patient will return to radiation oncology clinic for routine followup in one month. I advised them to call or return sooner if they have any questions or concerns related to their recovery or treatment.  ------------------------------------------------  Jodelle Gross, MD, PhD  This document serves as a record of services personally performed by Kyung Rudd, MD. It was created on his behalf by Margit Banda, a trained medical scribe. The creation of this record is based on the scribe's personal observations and the provider's statements to them. This document has been checked and approved by the attending provider.

## 2017-04-21 DIAGNOSIS — R339 Retention of urine, unspecified: Secondary | ICD-10-CM | POA: Diagnosis not present

## 2017-04-21 DIAGNOSIS — N3040 Irradiation cystitis without hematuria: Secondary | ICD-10-CM | POA: Diagnosis not present

## 2017-04-21 DIAGNOSIS — C679 Malignant neoplasm of bladder, unspecified: Secondary | ICD-10-CM | POA: Diagnosis not present

## 2017-05-04 DIAGNOSIS — Z9889 Other specified postprocedural states: Secondary | ICD-10-CM | POA: Diagnosis not present

## 2017-05-04 DIAGNOSIS — C20 Malignant neoplasm of rectum: Secondary | ICD-10-CM | POA: Diagnosis not present

## 2017-05-05 ENCOUNTER — Emergency Department (HOSPITAL_BASED_OUTPATIENT_CLINIC_OR_DEPARTMENT_OTHER)
Admission: EM | Admit: 2017-05-05 | Discharge: 2017-05-05 | Disposition: A | Discharge status: Home / Self Care | Payer: Medicare HMO | Attending: Emergency Medicine | Admitting: Emergency Medicine

## 2017-05-05 ENCOUNTER — Encounter (HOSPITAL_BASED_OUTPATIENT_CLINIC_OR_DEPARTMENT_OTHER): Payer: Self-pay | Admitting: Emergency Medicine

## 2017-05-05 DIAGNOSIS — E1140 Type 2 diabetes mellitus with diabetic neuropathy, unspecified: Secondary | ICD-10-CM | POA: Insufficient documentation

## 2017-05-05 DIAGNOSIS — R8290 Unspecified abnormal findings in urine: Secondary | ICD-10-CM | POA: Diagnosis not present

## 2017-05-05 DIAGNOSIS — Z7984 Long term (current) use of oral hypoglycemic drugs: Secondary | ICD-10-CM | POA: Diagnosis not present

## 2017-05-05 DIAGNOSIS — I2510 Atherosclerotic heart disease of native coronary artery without angina pectoris: Secondary | ICD-10-CM | POA: Diagnosis not present

## 2017-05-05 DIAGNOSIS — R300 Dysuria: Secondary | ICD-10-CM | POA: Insufficient documentation

## 2017-05-05 DIAGNOSIS — Z79899 Other long term (current) drug therapy: Secondary | ICD-10-CM | POA: Insufficient documentation

## 2017-05-05 DIAGNOSIS — R339 Retention of urine, unspecified: Secondary | ICD-10-CM | POA: Diagnosis not present

## 2017-05-05 DIAGNOSIS — N3289 Other specified disorders of bladder: Secondary | ICD-10-CM | POA: Diagnosis not present

## 2017-05-05 DIAGNOSIS — Z7982 Long term (current) use of aspirin: Secondary | ICD-10-CM | POA: Diagnosis not present

## 2017-05-05 DIAGNOSIS — Z951 Presence of aortocoronary bypass graft: Secondary | ICD-10-CM | POA: Insufficient documentation

## 2017-05-05 DIAGNOSIS — N390 Urinary tract infection, site not specified: Secondary | ICD-10-CM | POA: Diagnosis not present

## 2017-05-05 NOTE — ED Triage Notes (Signed)
Pt was seen today by urologist to have foley cath removed after having it in for 4 weeks. Pt states that since he has been home he has not been able to empty his bladder.

## 2017-05-05 NOTE — ED Provider Notes (Signed)
[facility] EMERGENCY DEPARTMENT Provider Note   CSN: [id number] Arrival date & time: 05/05/17  1650     History   Chief Complaint Chief Complaint  Patient presents with  . Urinary Retention    HPI Brendan Meyer is a 82 y.o. male.  HPI Patient just had his Foley catheter removed today.  He reports that he is having urinary frequency and urgency.  He does not feel that he can completely empty his bladder.  Ports this happened about a week ago when he tried to get his catheter removed.  Patient reports he would like the catheter put back in because he does not feel comfortable with having to try to urinate every 20-30 minutes. Past Medical History:  Diagnosis Date  . BPH (benign prostatic hyperplasia)   . Cataract 08/18/2016   cataract removal with implant left eye  . Coronary artery disease    s/p CABG  . Diabetes mellitus    type II   . Dysrhythmia    hx of afib post  [facility] in 2012   . History of kidney stones   . Hyperlipidemia   . Kidney stones   . Neuropathy     Patient Active Problem List   Diagnosis Date Noted  . Rectal cancer uT1uN0 s/p TEM partial proctectomy 01/06/2017 12/11/2016  . Hematuria 11/12/2013  . Urinary retention 11/12/2013  . Coronary atherosclerosis of native coronary artery 05/12/2011  . Skin nodule 05/12/2011  . UNSPECIFIED VISUAL DISTURBANCE 02/26/2010  . CAD, ARTERY BYPASS GRAFT 02/26/2010  . CORONARY ARTERY ANEURYSM 02/26/2010  . CARDIOMYOPATHY, ISCHEMIC 02/26/2010  . ATRIAL FIBRILLATION 02/26/2010  . UNSTABLE ANGINA 02/05/2010  . HYPERLIPIDEMIA-MIXED 11/29/2009  . DYSPNEA 11/29/2009  . Nonspecific (abnormal) findings on radiological and other examination of body structure 11/29/2009  . CT, CHEST, ABNORMAL 11/29/2009  . GERD 09/03/2009  . DIVERTICULOSIS-COLON 09/03/2009  . PERSONAL HX COLONIC POLYPS 09/03/2009  . DIABETES MELLITUS 07/12/2009  . WEIGHT LOSS-ABNORMAL 07/12/2009  . NAUSEA 07/12/2009  . ABDOMINAL PAIN  -GENERALIZED 07/12/2009    Past Surgical History:  Procedure Laterality Date  . CIRCUMCISION    . COLONOSCOPY    . CORONARY ARTERY BYPASS GRAFT  02/2011   x 6 vessel  . CYSTOSCOPY    . CYSTOSCOPY/RETROGRADE/URETEROSCOPY Bilateral 11/11/2013   Procedure: CYSTOSCOPY, BILATERAL RETROGRADE PYELOGRAM LEFT URETEROSCOPY, COLLECTION OF LEFT URETERAL WASHINGS FOR CYTOLOGY;  Surgeon: Ardis Hughs, MD;  Location: [facility];  Service: Urology;  Laterality: Bilateral;  . eardrum    . EUS N/A 12/18/2016   Procedure: LOWER ENDOSCOPIC ULTRASOUND (EUS);  Surgeon: Milus Banister, MD;  Location: [facility];  Service: Endoscopy;  Laterality: N/A;  . HERNIA REPAIR     double  . LITHOTRIPSY    . PATENT FORAMEN OVALE CLOSURE  2011  . POLYPECTOMY    . TONSILLECTOMY    . TRANSANAL ENDOSCOPIC MICROSURGERY N/A 01/06/2017   Procedure: PARTIAL PROCTECTOMY OF EARLY RECTAL CANCER, TRANSANAL ENDOSCOPIC MICROSURGERY  ERAS PATHWAY;  Surgeon: Michael Boston, MD;  Location: [facility];  Service: General;  Laterality: N/A;  ERAS PATHWAY  . UPPER GASTROINTESTINAL ENDOSCOPY         Home Medications    Prior to Admission medications   Medication Sig Start Date End Date Taking? Authorizing Provider  aspirin EC 81 MG tablet Take 81 mg by mouth daily.    [provider]  capecitabine (XELODA) 500 MG tablet Take 3 tablets (1500mg ) by mouth in AM and 3 tabs (1500mg ) in PM, within  30 minutes of meals, on days of radiation only Patient not taking: Reported on 02/20/2017 02/19/17   Ladell Pier, MD  Cholecalciferol (VITAMIN D) 2000 UNITS CAPS Take 2,000 Units by mouth daily.    [provider]  diazepam (VALIUM) 5 MG tablet Take 1 tablet (5 mg total) by mouth every 6 (six) hours as needed for muscle spasms (dufficulty urinating). Patient not taking: Reported on 02/18/2017 01/06/17   Michael Boston, MD  latanoprost (XALATAN) 0.005 % ophthalmic solution Place 1 drop into both eyes at bedtime.  09/29/16   [provider]  metFORMIN (GLUCOPHAGE) 500 MG tablet Take 500 mg by mouth 2 (two) times daily with a meal.      [provider]  metoprolol tartrate (LOPRESSOR) 25 MG tablet Take 25 mg by mouth at bedtime.  02/12/11   Bensimhon, Shaune Pascal, MD  oxycodone (OXY-IR) 5 MG capsule Take 5 mg by mouth every 4 (four) hours as needed.    [provider]  prednisoLONE acetate (PRED FORTE) 1 % ophthalmic suspension Place 1 drop into the left eye daily as needed.  09/23/16   [provider]  research study medication Inject into the skin every evening. Investigational study drug-Insulin 48 units every evening at 8 pm    [provider]  simvastatin (ZOCOR) 40 MG tablet Take 40 mg by mouth every evening.     [provider]  zolpidem (AMBIEN) 10 MG tablet Take 10 mg by mouth at bedtime as needed for sleep.     [provider]    Family History Family History  Problem Relation Age of Onset  . Breast cancer Maternal Aunt   . Colon cancer Neg Hx   . Colon polyps Neg Hx   . Esophageal cancer Neg Hx   . Rectal cancer Neg Hx   . Stomach cancer Neg Hx     Social History Social History  Substance Use Topics  . Smoking status: Never Smoker  . Smokeless tobacco: Never Used  . Alcohol use No     Allergies   Patient has no known allergies.   Review of Systems Review of Systems 10 Systems reviewed and are negative for acute change except as noted in the HPI.  Physical Exam Updated Vital Signs BP 133/83 (BP Location: Left Arm)   Pulse (!) 102   Temp 99 F (37.2 C) (Oral)   Resp 16   Ht 5\' 11"  (1.803 m)   Wt 72.1 kg (159 lb)   SpO2 100%   BMI 22.18 kg/m   Physical Exam  Constitutional: He is oriented to person, place, and time. He appears well-developed and well-nourished. No distress.  HENT:  Head: Atraumatic.  Eyes: EOM are normal.  Cardiovascular: Normal rate and regular rhythm.   Pulmonary/Chest: Effort normal and breath sounds normal.   Abdominal:  It is soft.  He feels urgency to urinate when I press on her pubic area.  Musculoskeletal: Normal range of motion.  Neurological: He is alert and oriented to person, place, and time. He exhibits normal muscle tone. Coordination normal.  Skin: Skin is warm and dry.  Psychiatric: He has a normal mood and affect.     ED Treatments / Results  Labs (all labs ordered are listed, but only abnormal results are displayed) Labs Reviewed - No data to display  EKG  EKG Interpretation None       Radiology No results found.  Procedures Procedures (including critical care time)  Medications Ordered  in ED Medications - No data to display   Initial Impression / Assessment and Plan / ED Course  I have reviewed the triage vital signs and the nursing notes.  Pertinent labs & imaging results that were available during my care of the patient were reviewed by me and considered in my medical decision making (see chart for details).     Final Clinical Impressions(s) / ED Diagnoses   Final diagnoses:  Urinary retention  Dysuria   She has a chronic recurrent problems prostatic hypertrophy and urinary retention.  Catheter was just removed today.  Patient feels very uncomfortable with urgency and symptoms of retention.  Catheter replaced.  Patient feels comfortable.  He is instructed to continue working closely with his urologist for further guidance. New Prescriptions New Prescriptions   No medications on file     Charlesetta Shanks, MD 05/05/17 [phone]

## 2017-05-12 ENCOUNTER — Telehealth: Payer: Self-pay | Admitting: Radiation Oncology

## 2017-05-12 DIAGNOSIS — R109 Unspecified abdominal pain: Secondary | ICD-10-CM | POA: Diagnosis not present

## 2017-05-12 DIAGNOSIS — R140 Abdominal distension (gaseous): Secondary | ICD-10-CM | POA: Diagnosis not present

## 2017-05-12 DIAGNOSIS — R339 Retention of urine, unspecified: Secondary | ICD-10-CM | POA: Diagnosis not present

## 2017-05-12 NOTE — Telephone Encounter (Signed)
LM for the patient to call me back so we can sort out who needs to be managing issues with his indwelling foley

## 2017-05-13 ENCOUNTER — Telehealth: Payer: Self-pay | Admitting: *Deleted

## 2017-05-13 NOTE — Telephone Encounter (Signed)
Message from pt requesting return call. He still has urinary catheter due to retention. Per pt, urologist has contacted radiation MD and was told it is "too soon to do anything about it." Pt has a cruise planned for 06/21/17 and is asking when he can expect to be able to have prostate procedure. Recommended he keep the appt with RadOnc on 11/12. It sounds like Dr. Lisbeth Renshaw has been collaborating with urology. He is planning to keep that appt. Informed him Dr. Benay Spice is out of the office and we'll discuss with him on 11/8.

## 2017-05-15 NOTE — Telephone Encounter (Signed)
Looks like he did not return for f/u here

## 2017-05-18 ENCOUNTER — Inpatient Hospital Stay: Admission: RE | Admit: 2017-05-18 | Payer: Self-pay | Source: Ambulatory Visit | Admitting: Radiation Oncology

## 2017-05-21 DIAGNOSIS — C20 Malignant neoplasm of rectum: Secondary | ICD-10-CM | POA: Diagnosis not present

## 2017-05-21 DIAGNOSIS — R339 Retention of urine, unspecified: Secondary | ICD-10-CM | POA: Diagnosis not present

## 2017-05-22 ENCOUNTER — Telehealth: Payer: Self-pay | Admitting: Oncology

## 2017-05-22 NOTE — Telephone Encounter (Signed)
Left message for patient regarding upcoming November appointments per 11/9 sch message.

## 2017-05-26 DIAGNOSIS — D649 Anemia, unspecified: Secondary | ICD-10-CM | POA: Diagnosis not present

## 2017-05-26 DIAGNOSIS — C20 Malignant neoplasm of rectum: Secondary | ICD-10-CM | POA: Diagnosis not present

## 2017-05-26 DIAGNOSIS — N401 Enlarged prostate with lower urinary tract symptoms: Secondary | ICD-10-CM | POA: Diagnosis not present

## 2017-05-26 DIAGNOSIS — E1151 Type 2 diabetes mellitus with diabetic peripheral angiopathy without gangrene: Secondary | ICD-10-CM | POA: Diagnosis not present

## 2017-05-26 DIAGNOSIS — E1129 Type 2 diabetes mellitus with other diabetic kidney complication: Secondary | ICD-10-CM | POA: Diagnosis not present

## 2017-05-26 DIAGNOSIS — N08 Glomerular disorders in diseases classified elsewhere: Secondary | ICD-10-CM | POA: Diagnosis not present

## 2017-05-26 DIAGNOSIS — I25810 Atherosclerosis of coronary artery bypass graft(s) without angina pectoris: Secondary | ICD-10-CM | POA: Diagnosis not present

## 2017-05-26 DIAGNOSIS — M25519 Pain in unspecified shoulder: Secondary | ICD-10-CM | POA: Diagnosis not present

## 2017-05-26 DIAGNOSIS — E559 Vitamin D deficiency, unspecified: Secondary | ICD-10-CM | POA: Diagnosis not present

## 2017-06-02 ENCOUNTER — Telehealth: Payer: Self-pay | Admitting: Oncology

## 2017-06-02 ENCOUNTER — Ambulatory Visit (HOSPITAL_BASED_OUTPATIENT_CLINIC_OR_DEPARTMENT_OTHER): Payer: Medicare HMO

## 2017-06-02 ENCOUNTER — Ambulatory Visit (HOSPITAL_BASED_OUTPATIENT_CLINIC_OR_DEPARTMENT_OTHER): Payer: Medicare HMO | Admitting: Oncology

## 2017-06-02 VITALS — BP 130/50 | HR 105 | Temp 97.7°F | Resp 20 | Ht 71.0 in | Wt 151.9 lb

## 2017-06-02 DIAGNOSIS — C20 Malignant neoplasm of rectum: Secondary | ICD-10-CM | POA: Diagnosis not present

## 2017-06-02 DIAGNOSIS — E119 Type 2 diabetes mellitus without complications: Secondary | ICD-10-CM | POA: Diagnosis not present

## 2017-06-02 DIAGNOSIS — R300 Dysuria: Secondary | ICD-10-CM

## 2017-06-02 LAB — CBC WITH DIFFERENTIAL/PLATELET
BASO%: 0.3 % (ref 0.0–2.0)
BASOS ABS: 0 10*3/uL (ref 0.0–0.1)
EOS%: 3.4 % (ref 0.0–7.0)
Eosinophils Absolute: 0.2 10*3/uL (ref 0.0–0.5)
HCT: 36.6 % — ABNORMAL LOW (ref 38.4–49.9)
HGB: 11.9 g/dL — ABNORMAL LOW (ref 13.0–17.1)
LYMPH%: 19.6 % (ref 14.0–49.0)
MCH: 29.5 pg (ref 27.2–33.4)
MCHC: 32.5 g/dL (ref 32.0–36.0)
MCV: 90.8 fL (ref 79.3–98.0)
MONO#: 0.5 10*3/uL (ref 0.1–0.9)
MONO%: 8.6 % (ref 0.0–14.0)
NEUT#: 4 10*3/uL (ref 1.5–6.5)
NEUT%: 68.1 % (ref 39.0–75.0)
Platelets: 248 10*3/uL (ref 140–400)
RBC: 4.03 10*6/uL — AB (ref 4.20–5.82)
RDW: 15.9 % — ABNORMAL HIGH (ref 11.0–14.6)
WBC: 5.9 10*3/uL (ref 4.0–10.3)
lymph#: 1.2 10*3/uL (ref 0.9–3.3)

## 2017-06-02 LAB — COMPREHENSIVE METABOLIC PANEL
ALBUMIN: 3.6 g/dL (ref 3.5–5.0)
ALT: 14 U/L (ref 0–55)
AST: 13 U/L (ref 5–34)
Alkaline Phosphatase: 51 U/L (ref 40–150)
Anion Gap: 11 mEq/L (ref 3–11)
BILIRUBIN TOTAL: 0.39 mg/dL (ref 0.20–1.20)
BUN: 15.9 mg/dL (ref 7.0–26.0)
CO2: 24 mEq/L (ref 22–29)
CREATININE: 1.2 mg/dL (ref 0.7–1.3)
Calcium: 9.4 mg/dL (ref 8.4–10.4)
Chloride: 105 mEq/L (ref 98–109)
EGFR: 60 mL/min/{1.73_m2} — ABNORMAL LOW (ref >60)
GLUCOSE: 175 mg/dL — AB (ref 70–140)
Potassium: 4.4 mEq/L (ref 3.5–5.1)
SODIUM: 140 meq/L (ref 136–145)
TOTAL PROTEIN: 7.3 g/dL (ref 6.4–8.3)

## 2017-06-02 NOTE — Telephone Encounter (Signed)
Gave avs and calendar for May 2019 °

## 2017-06-02 NOTE — Progress Notes (Signed)
  [facility] OFFICE PROGRESS NOTE   Diagnosis: Rectal cancer  INTERVAL HISTORY:   Brendan Meyer completed Xeloda and radiation 04/09/2017.  He denies mouth sores, diarrhea, and hand/foot pain.  He developed urinary retention and a Foley catheter was placed.  Attempts at removal of the Foley catheter have been unsuccessful.  He is followed by urology at [facility]. He reports recent follow-up with Dr. Johney Maine for surveillance of the rectum.  He developed anorexia during treatment with chemotherapy/radiation.  This has improved.  He has malaise.  Objective:  Vital signs in last 24 hours:  Blood pressure (!) 130/50, pulse (!) 105, temperature 97.7 F (36.5 C), temperature source Oral, resp. rate 20, height '5\' 11"'$  (1.803 m), weight 151 lb 14.4 oz (68.9 kg), SpO2 100 %.    HEENT: Neck without mass Lymphatics: No cervical, supraclavicular, axillary, or inguinal nodes Resp: Lungs clear bilaterally Cardio: Regular rate and rhythm GI: No hepatomegaly, nontender, no mass Vascular: No leg edema   Lab Results:  Lab Results  Component Value Date   WBC 5.9 06/02/2017   HGB 11.9 (L) 06/02/2017   HCT 36.6 (L) 06/02/2017   MCV 90.8 06/02/2017   PLT 248 06/02/2017   NEUTROABS 4.0 06/02/2017    CMP     Component Value Date/Time   NA 140 06/02/2017 1607   K 4.4 06/02/2017 1607   CL 104 11/28/2016 1310   CO2 24 06/02/2017 1607   GLUCOSE 175 (H) 06/02/2017 1607   BUN 15.9 06/02/2017 1607   CREATININE 1.2 06/02/2017 1607   CALCIUM 9.4 06/02/2017 1607   PROT 7.3 06/02/2017 1607   ALBUMIN 3.6 06/02/2017 1607   AST 13 06/02/2017 1607   ALT 14 06/02/2017 1607   ALKPHOS 51 06/02/2017 1607   BILITOT 0.39 06/02/2017 1607   GFRNONAA >60 02/14/2010 0540   GFRAA  02/14/2010 0540    >60        The eGFR has been calculated using the MDRD equation. This calculation has not been validated in all clinical situations. eGFR's persistently <60 mL/min signify possible Chronic  Kidney Disease.    Lab Results  Component Value Date   CEA1 <1.00 02/19/2017     Medications: I have reviewed the patient's current medications.  Assessment/Plan: 1. Rectal cancer, colonoscopy 11/24/2016 revealed a rectal mass extending to the dentate line, biopsy confirmed invasive adenocarcinoma ? CTs of the chest, abdomen, and pelvis 12/04/2016-negative for metastatic disease, no lymphadenopathy, rectal mass visualized ? EUS 12/18/2016,uT1N0tumor with the distal edge at 0.5 cm from the anal verge ? Transanal excision 01/06/2017-T2, no lymph nodes submitted, lymphovascular invasion noted ? Initiation of radiation/Xeloda 03/02/2017, completed 04/09/2017 2. Descending and sigmoid polyps removed at colonoscopy 11/24/2016-tubular adenomas  3. History of coronary artery disease, status post coronary artery bypass surgery  4. Diabetes  5.   Urinary frequency, urgency, dysuria with negative urinalysis/culture 03/13/2017  Urinary retention-Foley catheter in place, followed by Dr. Estill Dooms at [facility]   Disposition:  [name] is in clinical remission from rectal cancer.  We will follow-up on the CEA from today.  He will return for an office visit and CEA in 6 months.  He will continue surveillance of the rectum with Dr. Johney Maine.  15 minutes were spent with the patient today.  The majority of the time was used for counseling and coordination of care.  Betsy Coder, MD  06/02/2017  5:15 PM

## 2017-06-03 ENCOUNTER — Other Ambulatory Visit: Payer: Self-pay

## 2017-06-03 LAB — CEA (IN HOUSE-CHCC): CEA (CHCC-In House): 1.06 ng/mL (ref 0.00–5.00)

## 2017-06-04 ENCOUNTER — Telehealth: Payer: Self-pay | Admitting: Emergency Medicine

## 2017-06-04 NOTE — Telephone Encounter (Signed)
Notified pt of normal CEA. He voiced understanding. 

## 2017-06-04 NOTE — Telephone Encounter (Addendum)
Called pt regarding this message. Pt does not have a VM set up. Will try again later.   ----- Message from Ladell Pier, MD sent at 06/03/2017  5:03 PM EST ----- Please call patient, cea is normal

## 2017-06-05 DIAGNOSIS — T83098A Other mechanical complication of other indwelling urethral catheter, initial encounter: Secondary | ICD-10-CM | POA: Diagnosis not present

## 2017-06-05 DIAGNOSIS — R339 Retention of urine, unspecified: Secondary | ICD-10-CM | POA: Diagnosis not present

## 2017-06-05 DIAGNOSIS — Y846 Urinary catheterization as the cause of abnormal reaction of the patient, or of later complication, without mention of misadventure at the time of the procedure: Secondary | ICD-10-CM | POA: Diagnosis not present

## 2017-06-05 DIAGNOSIS — R109 Unspecified abdominal pain: Secondary | ICD-10-CM | POA: Diagnosis not present

## 2017-06-20 DIAGNOSIS — Z466 Encounter for fitting and adjustment of urinary device: Secondary | ICD-10-CM | POA: Diagnosis not present

## 2017-06-20 DIAGNOSIS — Z85038 Personal history of other malignant neoplasm of large intestine: Secondary | ICD-10-CM | POA: Diagnosis not present

## 2017-06-20 DIAGNOSIS — R339 Retention of urine, unspecified: Secondary | ICD-10-CM | POA: Diagnosis not present

## 2017-06-20 DIAGNOSIS — T83098A Other mechanical complication of other indwelling urethral catheter, initial encounter: Secondary | ICD-10-CM | POA: Diagnosis not present

## 2017-07-31 ENCOUNTER — Ambulatory Visit: Payer: Medicare HMO | Admitting: Oncology

## 2017-07-31 ENCOUNTER — Other Ambulatory Visit: Payer: Medicare HMO

## 2017-07-31 DIAGNOSIS — R339 Retention of urine, unspecified: Secondary | ICD-10-CM | POA: Diagnosis not present

## 2017-08-05 DIAGNOSIS — Z86008 Personal history of in-situ neoplasm of other site: Secondary | ICD-10-CM | POA: Diagnosis not present

## 2017-08-05 DIAGNOSIS — L570 Actinic keratosis: Secondary | ICD-10-CM | POA: Diagnosis not present

## 2017-08-05 DIAGNOSIS — L821 Other seborrheic keratosis: Secondary | ICD-10-CM | POA: Diagnosis not present

## 2017-08-24 ENCOUNTER — Emergency Department (HOSPITAL_BASED_OUTPATIENT_CLINIC_OR_DEPARTMENT_OTHER)
Admission: EM | Admit: 2017-08-24 | Discharge: 2017-08-24 | Disposition: A | Discharge status: Home / Self Care | Payer: Medicare HMO | Attending: Emergency Medicine | Admitting: Emergency Medicine

## 2017-08-24 ENCOUNTER — Other Ambulatory Visit: Payer: Self-pay

## 2017-08-24 ENCOUNTER — Encounter (HOSPITAL_BASED_OUTPATIENT_CLINIC_OR_DEPARTMENT_OTHER): Payer: Self-pay

## 2017-08-24 DIAGNOSIS — R109 Unspecified abdominal pain: Secondary | ICD-10-CM | POA: Diagnosis not present

## 2017-08-24 DIAGNOSIS — Y829 Unspecified medical devices associated with adverse incidents: Secondary | ICD-10-CM | POA: Diagnosis not present

## 2017-08-24 DIAGNOSIS — I2510 Atherosclerotic heart disease of native coronary artery without angina pectoris: Secondary | ICD-10-CM | POA: Insufficient documentation

## 2017-08-24 DIAGNOSIS — Z79899 Other long term (current) drug therapy: Secondary | ICD-10-CM | POA: Insufficient documentation

## 2017-08-24 DIAGNOSIS — T839XXA Unspecified complication of genitourinary prosthetic device, implant and graft, initial encounter: Secondary | ICD-10-CM | POA: Diagnosis not present

## 2017-08-24 DIAGNOSIS — E1140 Type 2 diabetes mellitus with diabetic neuropathy, unspecified: Secondary | ICD-10-CM | POA: Insufficient documentation

## 2017-08-24 DIAGNOSIS — Z7982 Long term (current) use of aspirin: Secondary | ICD-10-CM | POA: Insufficient documentation

## 2017-08-24 DIAGNOSIS — E119 Type 2 diabetes mellitus without complications: Secondary | ICD-10-CM | POA: Insufficient documentation

## 2017-08-24 DIAGNOSIS — T83091A Other mechanical complication of indwelling urethral catheter, initial encounter: Secondary | ICD-10-CM | POA: Diagnosis not present

## 2017-08-24 DIAGNOSIS — Z85048 Personal history of other malignant neoplasm of rectum, rectosigmoid junction, and anus: Secondary | ICD-10-CM | POA: Insufficient documentation

## 2017-08-24 NOTE — ED Provider Notes (Signed)
[facility]GENCY DEPARTMENT Provider Note   CSN: [id number] Arrival date & time: 08/24/17  2054     History   Chief Complaint Chief Complaint  Patient presents with  . Foley cath blocked    HPI Brendan Meyer is a 83 y.o. male.  The history is provided by the patient and medical records. No language interpreter was used.  Abdominal Pain   This is a new problem. The current episode started 6 to 12 hours ago. The problem occurs constantly. The problem has not changed since onset.The pain is associated with an unknown (clogged foley cath) factor. The pain is located in the suprapubic region. The quality of the pain is aching. The pain is mild. Pertinent negatives include fever, diarrhea, nausea, vomiting, constipation, dysuria, frequency and headaches. Nothing aggravates the symptoms. Nothing relieves the symptoms.    Past Medical History:  Diagnosis Date  . BPH (benign prostatic hyperplasia)   . Cataract 08/18/2016   cataract removal with implant left eye  . Coronary artery disease    s/p CABG  . Diabetes mellitus    type II   . Dysrhythmia    hx of afib post  [facility] in 2012   . History of kidney stones   . Hyperlipidemia   . Kidney stones   . Neuropathy     Patient Active Problem List   Diagnosis Date Noted  . Rectal cancer uT1uN0 s/p TEM partial proctectomy 01/06/2017 12/11/2016  . Hematuria 11/12/2013  . Urinary retention 11/12/2013  . Coronary atherosclerosis of native coronary artery 05/12/2011  . Skin nodule 05/12/2011  . UNSPECIFIED VISUAL DISTURBANCE 02/26/2010  . CAD, ARTERY BYPASS GRAFT 02/26/2010  . CORONARY ARTERY ANEURYSM 02/26/2010  . CARDIOMYOPATHY, ISCHEMIC 02/26/2010  . ATRIAL FIBRILLATION 02/26/2010  . UNSTABLE ANGINA 02/05/2010  . HYPERLIPIDEMIA-MIXED 11/29/2009  . DYSPNEA 11/29/2009  . Nonspecific (abnormal) findings on radiological and other examination of body structure 11/29/2009  . CT, CHEST, ABNORMAL 11/29/2009  . GERD  09/03/2009  . DIVERTICULOSIS-COLON 09/03/2009  . PERSONAL HX COLONIC POLYPS 09/03/2009  . DIABETES MELLITUS 07/12/2009  . WEIGHT LOSS-ABNORMAL 07/12/2009  . NAUSEA 07/12/2009  . ABDOMINAL PAIN -GENERALIZED 07/12/2009    Past Surgical History:  Procedure Laterality Date  . CIRCUMCISION    . COLONOSCOPY    . CORONARY ARTERY BYPASS GRAFT  02/2011   x 6 vessel  . CYSTOSCOPY    . CYSTOSCOPY/RETROGRADE/URETEROSCOPY Bilateral 11/11/2013   Procedure: CYSTOSCOPY, BILATERAL RETROGRADE PYELOGRAM LEFT URETEROSCOPY, COLLECTION OF LEFT URETERAL WASHINGS FOR CYTOLOGY;  Surgeon: Ardis Hughs, MD;  Location: [facility];  Service: Urology;  Laterality: Bilateral;  . eardrum    . EUS N/A 12/18/2016   Procedure: LOWER ENDOSCOPIC ULTRASOUND (EUS);  Surgeon: Milus Banister, MD;  Location: [facility];  Service: Endoscopy;  Laterality: N/A;  . HERNIA REPAIR     double  . LITHOTRIPSY    . PATENT FORAMEN OVALE CLOSURE  2011  . POLYPECTOMY    . TONSILLECTOMY    . TRANSANAL ENDOSCOPIC MICROSURGERY N/A 01/06/2017   Procedure: PARTIAL PROCTECTOMY OF EARLY RECTAL CANCER, TRANSANAL ENDOSCOPIC MICROSURGERY  ERAS PATHWAY;  Surgeon: Michael Boston, MD;  Location: [facility];  Service: General;  Laterality: N/A;  ERAS PATHWAY  . UPPER GASTROINTESTINAL ENDOSCOPY         Home Medications    Prior to Admission medications   Medication Sig Start Date End Date Taking? Authorizing Provider  aspirin EC 81 MG tablet Take 81 mg by mouth daily.  [provider]  Cholecalciferol (VITAMIN D) 2000 UNITS CAPS Take 2,000 Units by mouth daily.    [provider]  metFORMIN (GLUCOPHAGE) 500 MG tablet Take 500 mg by mouth 2 (two) times daily with a meal.      [provider]  metoprolol tartrate (LOPRESSOR) 25 MG tablet Take 25 mg by mouth at bedtime.  02/12/11   Bensimhon, Shaune Pascal, MD  oxycodone (OXY-IR) 5 MG capsule Take 5 mg by mouth every 4 (four) hours as needed.    [provider]    research study medication Inject into the skin every evening. Investigational study drug-Insulin 48 units every evening at 8 pm    [provider]  simvastatin (ZOCOR) 40 MG tablet Take 40 mg by mouth every evening.     [provider]  zolpidem (AMBIEN) 10 MG tablet Take 10 mg by mouth at bedtime as needed for sleep.     [provider]    Family History Family History  Problem Relation Age of Onset  . Breast cancer Maternal Aunt   . Colon cancer Neg Hx   . Colon polyps Neg Hx   . Esophageal cancer Neg Hx   . Rectal cancer Neg Hx   . Stomach cancer Neg Hx     Social History Social History   Tobacco Use  . Smoking status: Never Smoker  . Smokeless tobacco: Never Used  Substance Use Topics  . Alcohol use: No  . Drug use: No     Allergies   Patient has no known allergies.   Review of Systems Review of Systems  Constitutional: Negative for activity change, chills, diaphoresis, fatigue and fever.  HENT: Negative for congestion and rhinorrhea.   Eyes: Negative for visual disturbance.  Respiratory: Negative for cough, chest tightness, shortness of breath, wheezing and stridor.   Cardiovascular: Negative for chest pain, palpitations and leg swelling.  Gastrointestinal: Positive for abdominal pain. Negative for abdominal distention, blood in stool, constipation, diarrhea, nausea and vomiting.  Genitourinary: Positive for difficulty urinating. Negative for dysuria, flank pain and frequency.  Musculoskeletal: Negative for back pain, gait problem, neck pain and neck stiffness.  Skin: Negative for rash and wound.  Neurological: Negative for dizziness, weakness, light-headedness, numbness and headaches.  Psychiatric/Behavioral: Negative for agitation and confusion.  All other systems reviewed and are negative.    Physical Exam Updated Vital Signs BP (!) 144/66 (BP Location: Right Arm)   Pulse 69   Temp 97.9 F (36.6 C) (Oral)   Resp 18   Ht 5'  11" (1.803 m)   Wt 67.6 kg (149 lb)   SpO2 100%   BMI 20.78 kg/m   Physical Exam  Constitutional: He is oriented to person, place, and time. He appears well-developed and well-nourished. No distress.  HENT:  Head: Normocephalic and atraumatic.  Mouth/Throat: Oropharynx is clear and moist. No oropharyngeal exudate.  Eyes: Conjunctivae and EOM are normal. Pupils are equal, round, and reactive to light.  Neck: Normal range of motion. Neck supple.  Cardiovascular: Normal rate and regular rhythm.  No murmur heard. Pulmonary/Chest: Effort normal and breath sounds normal. No respiratory distress.  Abdominal: Soft. Normal appearance. He exhibits no distension. There is no tenderness. There is no rigidity, no rebound and no CVA tenderness.  Foley catheter in place with no abdominal tenderness.  Musculoskeletal: He exhibits no edema or tenderness.  Neurological: He is alert and oriented to person, place, and time.  Skin: Skin is warm and dry. He is  not diaphoretic. No erythema. No pallor.  Psychiatric: He has a normal mood and affect.  Nursing note and vitals reviewed.    ED Treatments / Results  Labs (all labs ordered are listed, but only abnormal results are displayed) Labs Reviewed - No data to display  EKG  EKG Interpretation None       Radiology No results found.  Procedures Procedures (including critical care time)  Medications Ordered in ED Medications - No data to display   Initial Impression / Assessment and Plan / ED Course  I have reviewed the triage vital signs and the nursing notes.  Pertinent labs & imaging results that were available during my care of the patient were reviewed by me and considered in my medical decision making (see chart for details).     Brendan Meyer is a 83 y.o. male with a past medical history significant for Foley catheter dependence, CAD, diabetes, hyperlipidemia, kidney stones and BPH who presents with Foley catheter problem  and abdominal discomfort.  Patient reports that his Foley catheter has been clogged today and is not been draining.  He said he developed some abdominal discomfort and aching sensation.  He denies fevers, chills, dysuria, or other symptoms today.  He denies trauma.  He says he has had his catheter for approximately 5 months and is scheduled to see his urologist in 2 days for further management.  As patient was being placed in his room, nursing flush his catheter and was able to get return of clear urine.  Patient reports his abdominal pain resolved and he was feeling better.  Patient had no further evidence of catheter clogging.    Bladder scan was performed to confirm resolution of obstruction and there was no significant urine seen.  Patient will follow up with urology in several days as well as return precautions for any new or worsened symptoms.  Patient had no other questions or concerns and was discharged in good condition with resolution of Foley problem.         Final Clinical Impressions(s) / ED Diagnoses   Final diagnoses:  Problem with Foley catheter, initial encounter [facility])    ED Discharge Orders    None      Clinical Impression: 1. Problem with Foley catheter, initial encounter [facility])     Disposition: Discharge  Condition: Good  I have discussed the results, Dx and Tx plan with the pt(& family if present). He/she/they expressed understanding and agree(s) with the plan. Discharge instructions discussed at great length. Strict return precautions discussed and pt &/or family have verbalized understanding of the instructions. No further questions at time of discharge.    Discharge Medication List as of 08/24/2017 11:44 PM      Follow Up: Reynold Bowen, [place] [address] [phone]     [place] [facility] [address] [id number] [address] [place] [phone]       Tegeler,  Gwenyth Allegra, MD 08/25/17 [phone]

## 2017-08-24 NOTE — ED Triage Notes (Signed)
Pt states his foley cath is not draining x 2 hours-pt anxious-grimacing in pain

## 2017-08-24 NOTE — ED Notes (Signed)
Pt arrives with catheter in place that is not draining.  Foley irrigated and flushed with approximately 23mL of normal saline with cloudy fluid return.  Catheter draining now, emptied and will begin new collection.  Pt states he feels much better.

## 2017-08-24 NOTE — ED Notes (Signed)
Pt verbalizes understanding of d/c instructions and denies any further needs at this time. 

## 2017-08-25 NOTE — Discharge Instructions (Signed)
Your Foley catheter was clogged today which we fixed for you.  Your bladder scan did not show evidence of re-obstruction.  Please follow-up with your urologist in 2 days as previously scheduled.  If any symptoms change or worsen, please return to the nearest emergency department.

## 2017-08-26 DIAGNOSIS — R339 Retention of urine, unspecified: Secondary | ICD-10-CM | POA: Diagnosis not present

## 2017-08-26 DIAGNOSIS — Z923 Personal history of irradiation: Secondary | ICD-10-CM | POA: Diagnosis not present

## 2017-08-26 DIAGNOSIS — N3289 Other specified disorders of bladder: Secondary | ICD-10-CM | POA: Diagnosis not present

## 2017-08-26 DIAGNOSIS — C20 Malignant neoplasm of rectum: Secondary | ICD-10-CM | POA: Diagnosis not present

## 2017-09-07 DIAGNOSIS — R339 Retention of urine, unspecified: Secondary | ICD-10-CM | POA: Diagnosis not present

## 2017-09-07 DIAGNOSIS — R8290 Unspecified abnormal findings in urine: Secondary | ICD-10-CM | POA: Diagnosis not present

## 2017-09-16 DIAGNOSIS — I2510 Atherosclerotic heart disease of native coronary artery without angina pectoris: Secondary | ICD-10-CM | POA: Diagnosis not present

## 2017-09-16 DIAGNOSIS — D649 Anemia, unspecified: Secondary | ICD-10-CM | POA: Diagnosis not present

## 2017-09-16 DIAGNOSIS — T83010A Breakdown (mechanical) of cystostomy catheter, initial encounter: Secondary | ICD-10-CM | POA: Diagnosis not present

## 2017-09-16 DIAGNOSIS — N400 Enlarged prostate without lower urinary tract symptoms: Secondary | ICD-10-CM | POA: Diagnosis not present

## 2017-09-16 DIAGNOSIS — N401 Enlarged prostate with lower urinary tract symptoms: Secondary | ICD-10-CM | POA: Diagnosis not present

## 2017-09-16 DIAGNOSIS — I471 Supraventricular tachycardia: Secondary | ICD-10-CM | POA: Diagnosis not present

## 2017-09-16 DIAGNOSIS — I4902 Ventricular flutter: Secondary | ICD-10-CM | POA: Diagnosis not present

## 2017-09-16 DIAGNOSIS — E785 Hyperlipidemia, unspecified: Secondary | ICD-10-CM | POA: Diagnosis not present

## 2017-09-16 DIAGNOSIS — R339 Retention of urine, unspecified: Secondary | ICD-10-CM | POA: Diagnosis not present

## 2017-09-16 DIAGNOSIS — I4892 Unspecified atrial flutter: Secondary | ICD-10-CM | POA: Diagnosis not present

## 2017-09-16 DIAGNOSIS — C189 Malignant neoplasm of colon, unspecified: Secondary | ICD-10-CM | POA: Diagnosis not present

## 2017-09-16 DIAGNOSIS — E7849 Other hyperlipidemia: Secondary | ICD-10-CM | POA: Diagnosis not present

## 2017-09-16 DIAGNOSIS — T83011A Breakdown (mechanical) of indwelling urethral catheter, initial encounter: Secondary | ICD-10-CM | POA: Diagnosis not present

## 2017-09-16 DIAGNOSIS — R079 Chest pain, unspecified: Secondary | ICD-10-CM | POA: Diagnosis not present

## 2017-09-16 DIAGNOSIS — R338 Other retention of urine: Secondary | ICD-10-CM | POA: Diagnosis not present

## 2017-09-16 DIAGNOSIS — R000 Tachycardia, unspecified: Secondary | ICD-10-CM | POA: Diagnosis not present

## 2017-09-17 DIAGNOSIS — I2510 Atherosclerotic heart disease of native coronary artery without angina pectoris: Secondary | ICD-10-CM | POA: Diagnosis not present

## 2017-09-17 DIAGNOSIS — R000 Tachycardia, unspecified: Secondary | ICD-10-CM | POA: Diagnosis not present

## 2017-09-17 DIAGNOSIS — I4892 Unspecified atrial flutter: Secondary | ICD-10-CM | POA: Diagnosis not present

## 2017-09-17 DIAGNOSIS — E7849 Other hyperlipidemia: Secondary | ICD-10-CM | POA: Diagnosis not present

## 2017-09-17 DIAGNOSIS — E119 Type 2 diabetes mellitus without complications: Secondary | ICD-10-CM | POA: Diagnosis not present

## 2017-09-17 DIAGNOSIS — R339 Retention of urine, unspecified: Secondary | ICD-10-CM | POA: Diagnosis not present

## 2017-09-28 DIAGNOSIS — R339 Retention of urine, unspecified: Secondary | ICD-10-CM | POA: Diagnosis not present

## 2017-09-28 DIAGNOSIS — Z9889 Other specified postprocedural states: Secondary | ICD-10-CM | POA: Diagnosis not present

## 2017-09-28 DIAGNOSIS — C20 Malignant neoplasm of rectum: Secondary | ICD-10-CM | POA: Diagnosis not present

## 2017-09-28 DIAGNOSIS — Z9359 Other cystostomy status: Secondary | ICD-10-CM | POA: Diagnosis not present

## 2017-09-30 DIAGNOSIS — E7849 Other hyperlipidemia: Secondary | ICD-10-CM | POA: Diagnosis not present

## 2017-09-30 DIAGNOSIS — I4891 Unspecified atrial fibrillation: Secondary | ICD-10-CM | POA: Diagnosis not present

## 2017-09-30 DIAGNOSIS — I25810 Atherosclerosis of coronary artery bypass graft(s) without angina pectoris: Secondary | ICD-10-CM | POA: Diagnosis not present

## 2017-09-30 DIAGNOSIS — N08 Glomerular disorders in diseases classified elsewhere: Secondary | ICD-10-CM | POA: Diagnosis not present

## 2017-09-30 DIAGNOSIS — E1129 Type 2 diabetes mellitus with other diabetic kidney complication: Secondary | ICD-10-CM | POA: Diagnosis not present

## 2017-09-30 DIAGNOSIS — C20 Malignant neoplasm of rectum: Secondary | ICD-10-CM | POA: Diagnosis not present

## 2017-09-30 DIAGNOSIS — E559 Vitamin D deficiency, unspecified: Secondary | ICD-10-CM | POA: Diagnosis not present

## 2017-09-30 DIAGNOSIS — R339 Retention of urine, unspecified: Secondary | ICD-10-CM | POA: Diagnosis not present

## 2017-09-30 DIAGNOSIS — D6489 Other specified anemias: Secondary | ICD-10-CM | POA: Diagnosis not present

## 2017-10-02 ENCOUNTER — Encounter: Payer: Self-pay | Admitting: Cardiovascular Disease

## 2017-10-02 ENCOUNTER — Telehealth: Payer: Self-pay | Admitting: Cardiovascular Disease

## 2017-10-02 ENCOUNTER — Ambulatory Visit: Payer: Medicare HMO | Admitting: Cardiovascular Disease

## 2017-10-02 DIAGNOSIS — I480 Paroxysmal atrial fibrillation: Secondary | ICD-10-CM | POA: Diagnosis not present

## 2017-10-02 DIAGNOSIS — I25700 Atherosclerosis of coronary artery bypass graft(s), unspecified, with unstable angina pectoris: Secondary | ICD-10-CM

## 2017-10-02 NOTE — Progress Notes (Signed)
10/02/2017 Brendan Meyer   12/13/1934  [id number]  Primary Physician Brendan Bowen, MD Primary Cardiologist: Brendan Harp MD Brendan Meyer, [place]  HPI:  Brendan Meyer is a 83 y.o.  married Caucasian male father of 2 living children (1 deceased daughter), grandfather of 4 grandchildren referred for cardiovascular clearance before surgery  for rectal cancer. His primary care provider is Brendan Meyer and surgeon Dr. Michael Meyer. I last saw him in the office 01/02/17.He has a history of diabetes and hyperlipidemia. He coronary artery bypass grafting 02/14/10 with 6 grafts placed at that time. He had a LIMA to his LAD, a vein to a diagonal branch, obtuse marginal branch, and PDA. He also had a patent foraminal valley closed. His initial EF was 35-40% which had increased on subsequent echo to 50-55%. He has not seen a cardiologist since. He is fairly active and denies chest pain or shortness of breath. He does require rectal Cancer surgery and was referred here for cardiovascular clearance. 2-D echo and Myoview stress test were normal and he also underwent surgery without complication. He has had issues with bladder obstruction and had a Foley catheter now has a suprapubic catheter. He apparently was done in [place] and had an episode of outflow tract obstruction and was found to be in atrial flutter with RVR converted to sinus rhythm. Eliquis was recommended by them.     Current Meds  Medication Sig  . aspirin EC 81 MG tablet Take 81 mg by mouth daily.  Marland Kitchen atorvastatin (LIPITOR) 20 MG tablet Take 20 mg by mouth daily.  . Cholecalciferol (VITAMIN D) 2000 UNITS CAPS Take 2,000 Units by mouth daily.  Marland Kitchen ELIQUIS 2.5 MG TABS tablet Take 2.5 mg by mouth 2 (two) times daily.  Marland Kitchen latanoprost (XALATAN) 0.005 % ophthalmic solution Place 1 drop into both eyes at bedtime.  . metFORMIN (GLUCOPHAGE) 500 MG tablet Take 500 mg by mouth 2 (two) times daily with a meal.    . metoprolol  tartrate (LOPRESSOR) 25 MG tablet Take 25 mg by mouth at bedtime.   Marland Kitchen oxycodone (OXY-IR) 5 MG capsule Take 5 mg by mouth every 4 (four) hours as needed.  . simvastatin (ZOCOR) 40 MG tablet Take 40 mg by mouth every evening.   . TRESIBA FLEXTOUCH 200 UNIT/ML SOPN Inject 35 Units into the skin every evening.  . zolpidem (AMBIEN) 10 MG tablet Take 10 mg by mouth at bedtime as needed for sleep.    Current Facility-Administered Medications for the 10/02/17 encounter (Office Visit) with Brendan Harp, MD  Medication  . 0.9 %  sodium chloride infusion     No Known Allergies  Social History   Socioeconomic History  . Marital status: Married    Spouse name: Not on file  . Number of children: Not on file  . Years of education: Not on file  . Highest education level: Not on file  Occupational History  . Not on file  Social Needs  . Financial resource strain: Not on file  . Food insecurity:    Worry: Not on file    Inability: Not on file  . Transportation needs:    Medical: Not on file    Non-medical: Not on file  Tobacco Use  . Smoking status: Never Smoker  . Smokeless tobacco: Never Used  Substance and Sexual Activity  . Alcohol use: No  . Drug use: No  . Sexual activity: Not on file  Lifestyle  .  Physical activity:    Days per week: Not on file    Minutes per session: Not on file  . Stress: Not on file  Relationships  . Social connections:    Talks on phone: Not on file    Gets together: Not on file    Attends religious service: Not on file    Active member of club or organization: Not on file    Attends meetings of clubs or organizations: Not on file    Relationship status: Not on file  . Intimate partner violence:    Fear of current or ex partner: Not on file    Emotionally abused: Not on file    Physically abused: Not on file    Forced sexual activity: Not on file  Other Topics Concern  . Not on file  Social History Narrative   Retired-   Married with one son  and two daughters   Patient has never smoked   Alcohol use-no   Daily caffeine use 2 per day   Illicit drug use -no     Review of Systems: General: negative for chills, fever, night sweats or weight changes.  Cardiovascular: negative for chest pain, dyspnea on exertion, edema, orthopnea, palpitations, paroxysmal nocturnal dyspnea or shortness of breath Dermatological: negative for rash Respiratory: negative for cough or wheezing Urologic: negative for hematuria Abdominal: negative for nausea, vomiting, diarrhea, bright red blood per rectum, melena, or hematemesis Neurologic: negative for visual changes, syncope, or dizziness All other systems reviewed and are otherwise negative except as noted above.    Blood pressure 132/78, pulse 79, height 5\' 11"  (1.803 m), weight 151 lb 9.6 oz (68.8 kg).  General appearance: alert and no distress Neck: no adenopathy, no carotid bruit, no JVD, supple, symmetrical, trachea midline and thyroid not enlarged, symmetric, no tenderness/mass/nodules Lungs: clear to auscultation bilaterally Heart: regular rate and rhythm, S1, S2 normal, no murmur, click, rub or gallop Extremities: extremities normal, atraumatic, no cyanosis or edema Pulses: 2+ and symmetric Skin: Skin color, texture, turgor normal. No rashes or lesions Neurologic: Alert and oriented X 3, normal strength and tone. Normal symmetric reflexes. Normal coordination and gait  EKG sinus rhythm at 79 with septal Q waves. I personally reviewed this EKG.  ASSESSMENT AND PLAN:   HYPERLIPIDEMIA-MIXED istory of hyperlipidemia on statin therapy followed by his PCP  CAD, ARTERY BYPASS GRAFT History of CAD status post coronary artery bypass grafting 02/14/10 with a LIMA to the LAD, vein to diagonal branch, obtuse marginal branch and PDA. He also had a patent foramen ovale closed as well at that time. His EF initially was 35-40% which centrically roast to normal by 2-D echo.he denies chest pain or  shortness of breath.  ATRIAL FIBRILLATION The patient had a recent episode of atrial flutter with RVR in the setting of an occluded Foley catheter while in [place] a few weeks ago. They prescribed Eliquis oral anticoagulation.given that this was a one-time event and was in the clinic setting of outflow tract obstruction I do not think he needs long-term oral anticoagulation at this time.      Brendan Harp MD FACP,FACC,FAHA, [facility] 10/02/2017 11:04 AM

## 2017-10-02 NOTE — Assessment & Plan Note (Signed)
istory of hyperlipidemia on statin therapy followed by his PCP

## 2017-10-02 NOTE — Telephone Encounter (Signed)
Routed to Dr. Kennon Holter primary Lovena Le has patient was just seen in office today

## 2017-10-02 NOTE — Patient Instructions (Signed)

## 2017-10-02 NOTE — Telephone Encounter (Signed)
°  New message  Pt verbalized that he is calling for RN  To give her just the name of his medication   metoprolol tartrate (LOPRESSOR) 25 MG tablet Take 25 mg by mouth at bedtime.

## 2017-10-02 NOTE — Assessment & Plan Note (Signed)
History of CAD status post coronary artery bypass grafting 02/14/10 with a LIMA to the LAD, vein to diagonal branch, obtuse marginal branch and PDA. He also had a patent foramen ovale closed as well at that time. His EF initially was 35-40% which centrically roast to normal by 2-D echo.he denies chest pain or shortness of breath.

## 2017-10-02 NOTE — Telephone Encounter (Signed)
Already documented in pt chart.

## 2017-10-02 NOTE — Assessment & Plan Note (Signed)
The patient had a recent episode of atrial flutter with RVR in the setting of an occluded Foley catheter while in [place] a few weeks ago. They prescribed Eliquis oral anticoagulation.given that this was a one-time event and was in the clinic setting of outflow tract obstruction I do not think he needs long-term oral anticoagulation at this time.

## 2017-11-02 DIAGNOSIS — N401 Enlarged prostate with lower urinary tract symptoms: Secondary | ICD-10-CM | POA: Diagnosis not present

## 2017-11-02 DIAGNOSIS — N138 Other obstructive and reflux uropathy: Secondary | ICD-10-CM | POA: Diagnosis not present

## 2017-11-02 DIAGNOSIS — R339 Retention of urine, unspecified: Secondary | ICD-10-CM | POA: Diagnosis not present

## 2017-11-11 ENCOUNTER — Telehealth: Payer: Self-pay | Admitting: Oncology

## 2017-11-11 NOTE — Telephone Encounter (Signed)
Patient called to reschedule due to surgery °

## 2017-11-13 ENCOUNTER — Encounter: Payer: Self-pay | Admitting: Internal Medicine

## 2017-11-26 ENCOUNTER — Inpatient Hospital Stay: Payer: Medicare HMO | Attending: Oncology

## 2017-11-26 ENCOUNTER — Inpatient Hospital Stay: Payer: Medicare HMO | Admitting: Oncology

## 2017-11-26 ENCOUNTER — Telehealth: Payer: Self-pay | Admitting: Oncology

## 2017-11-26 ENCOUNTER — Telehealth: Payer: Self-pay

## 2017-11-26 VITALS — BP 125/64 | HR 72 | Temp 98.0°F | Resp 17 | Ht 71.0 in | Wt 153.4 lb

## 2017-11-26 DIAGNOSIS — C20 Malignant neoplasm of rectum: Secondary | ICD-10-CM | POA: Insufficient documentation

## 2017-11-26 DIAGNOSIS — R339 Retention of urine, unspecified: Secondary | ICD-10-CM | POA: Insufficient documentation

## 2017-11-26 DIAGNOSIS — I2510 Atherosclerotic heart disease of native coronary artery without angina pectoris: Secondary | ICD-10-CM | POA: Diagnosis not present

## 2017-11-26 DIAGNOSIS — Z951 Presence of aortocoronary bypass graft: Secondary | ICD-10-CM

## 2017-11-26 DIAGNOSIS — E119 Type 2 diabetes mellitus without complications: Secondary | ICD-10-CM | POA: Insufficient documentation

## 2017-11-26 LAB — CEA (IN HOUSE-CHCC): CEA (CHCC-In House): <1 ng/mL (ref 0.00–5.00)

## 2017-11-26 NOTE — Progress Notes (Signed)
  [place] OFFICE PROGRESS NOTE   Diagnosis: Rectal cancer  INTERVAL HISTORY:     Mr. Gianino returns as scheduled.  He feels well.  Good appetite and energy level.  No difficulty with bowel function.  He plans to schedule a colonoscopy for August.  He continues to have a suprapubic catheter.  He is scheduled for a TUR procedure next week.  Objective:  Vital signs in last 24 hours:  Blood pressure 125/64, pulse 72, temperature 98 F (36.7 C), temperature source Oral, resp. rate 17, height 5\' 11"  (1.803 m), weight 153 lb 6.4 oz (69.6 kg), SpO2 100 %.    HEENT: Neck without mass Lymphatics: No cervical, supraclavicular, axillary, or inguinal nodes Resp: Inspiratory bronchial sounds at the upper posterior chest bilaterally, no respiratory distress Cardio: Regular rate and rhythm GI: No hepatosplenomegaly, no mass, nontender, suprapubic catheter in place Vascular: No leg edema   Lab Results:    Lab Results  Component Value Date   CEA1 1.06 06/02/2017     Medications: I have reviewed the patient's current medications.   Assessment/Plan:  1. Rectal cancer, colonoscopy 11/24/2016 revealed a rectal mass extending to the dentate line, biopsy confirmed invasive adenocarcinoma ? CTs of the chest, abdomen, and pelvis 12/04/2016-negative for metastatic disease, no lymphadenopathy, rectal mass visualized ? EUS 12/18/2016,[id number] with the distal edge at 0.5 cm from the anal verge ? Transanal excision 01/06/2017-T2, no lymph nodes submitted, lymphovascular invasion noted ? Initiation of radiation/Xeloda 03/02/2017, completed 04/09/2017 2. Descending and sigmoid polyps removed at colonoscopy 11/24/2016-tubular adenomas  3. History of coronary artery disease, status post coronary artery bypass surgery  4. Diabetes  5.Urinary frequency, urgency, dysuria with negative urinalysis/culture 03/13/2017  Urinary retention- suprapubic catheter in place,  followed by Dr. Estill Do[facility] at [facility]   Disposition: Mr. Navarez is in clinical remission from rectal cancer.  We will follow-up on the CEA from today.  He will return for an office visit and CEA in 6 months.  He will schedule a colonoscopy for August.  15 minutes were spent with the patient today.  The majority of the time was used for counseling and coordination of care.  Betsy Coder, MD  11/26/2017  8:40 AM

## 2017-11-26 NOTE — Telephone Encounter (Signed)
-----   Message from Ladell Pier, MD sent at 11/26/2017 10:17 AM EDT ----- Please call patient, CEA is normal, follow-up as scheduled

## 2017-11-26 NOTE — Telephone Encounter (Signed)
Scheduled appt per 5/23 los _ gave patient AVS and calender per los.

## 2017-11-27 NOTE — Telephone Encounter (Addendum)
Pt voiced understandung of message below  ----- Message from Ladell Pier, MD sent at 11/26/2017 10:17 AM EDT ----- Please call patient, CEA is normal, follow-up as scheduled

## 2017-12-01 ENCOUNTER — Ambulatory Visit: Payer: Medicare HMO | Admitting: Oncology

## 2017-12-01 ENCOUNTER — Other Ambulatory Visit: Payer: Medicare HMO

## 2017-12-01 DIAGNOSIS — R338 Other retention of urine: Secondary | ICD-10-CM | POA: Diagnosis not present

## 2017-12-01 DIAGNOSIS — N138 Other obstructive and reflux uropathy: Secondary | ICD-10-CM | POA: Diagnosis not present

## 2017-12-01 DIAGNOSIS — N401 Enlarged prostate with lower urinary tract symptoms: Secondary | ICD-10-CM | POA: Diagnosis not present

## 2017-12-01 DIAGNOSIS — N410 Acute prostatitis: Secondary | ICD-10-CM | POA: Diagnosis not present

## 2017-12-23 DIAGNOSIS — R8290 Unspecified abnormal findings in urine: Secondary | ICD-10-CM | POA: Diagnosis not present

## 2017-12-23 DIAGNOSIS — N400 Enlarged prostate without lower urinary tract symptoms: Secondary | ICD-10-CM | POA: Diagnosis not present

## 2018-01-14 DIAGNOSIS — R8290 Unspecified abnormal findings in urine: Secondary | ICD-10-CM | POA: Diagnosis not present

## 2018-01-14 DIAGNOSIS — N400 Enlarged prostate without lower urinary tract symptoms: Secondary | ICD-10-CM | POA: Diagnosis not present

## 2018-01-19 ENCOUNTER — Ambulatory Visit: Payer: Medicare HMO | Admitting: Internal Medicine

## 2018-01-19 ENCOUNTER — Encounter: Payer: Self-pay | Admitting: Internal Medicine

## 2018-01-19 VITALS — BP 124/60 | HR 68 | Ht 71.0 in | Wt 153.0 lb

## 2018-01-19 DIAGNOSIS — C20 Malignant neoplasm of rectum: Secondary | ICD-10-CM

## 2018-01-19 NOTE — Patient Instructions (Signed)

## 2018-01-19 NOTE — Progress Notes (Signed)
HISTORY OF PRESENT ILLNESS:  Brendan Meyer is a 83 y.o. male diagnosed with rectal cancer 11/24/2016. Subsequently underwent transanal excision with Dr. Johney Maine July 2018. T2 lesion. Adjuvant chemotherapy and radiation therapy thereafter. Normalization of CEA. Transient problems with bladder outlet issues for which she recently had a suprapubic catheter removed. Overall feeling well. GI review of systems is negative.He is due for surveillance colonoscopy  REVIEW OF SYSTEMS:  All non-GI ROS negative upon review  Past Medical History:  Diagnosis Date  . BPH (benign prostatic hyperplasia)   . Cataract 08/18/2016   cataract removal with implant left eye  . Coronary artery disease    s/p CABG  . Diabetes mellitus    type II   . Dysrhythmia    hx of afib post  [facility] in 2012   . History of kidney stones   . Hyperlipidemia   . Kidney stones   . Neuropathy     Past Surgical History:  Procedure Laterality Date  . CIRCUMCISION    . COLONOSCOPY    . CORONARY ARTERY BYPASS GRAFT  02/2011   x 6 vessel  . CYSTOSCOPY    . CYSTOSCOPY/RETROGRADE/URETEROSCOPY Bilateral 11/11/2013   Procedure: CYSTOSCOPY, BILATERAL RETROGRADE PYELOGRAM LEFT URETEROSCOPY, COLLECTION OF LEFT URETERAL WASHINGS FOR CYTOLOGY;  Surgeon: Ardis Hughs, MD;  Location: [facility];  Service: Urology;  Laterality: Bilateral;  . eardrum    . EUS N/A 12/18/2016   Procedure: LOWER ENDOSCOPIC ULTRASOUND (EUS);  Surgeon: Milus Banister, MD;  Location: [facility];  Service: Endoscopy;  Laterality: N/A;  . HERNIA REPAIR     double  . LITHOTRIPSY    . PATENT FORAMEN OVALE CLOSURE  2011  . POLYPECTOMY    . TONSILLECTOMY    . TRANSANAL ENDOSCOPIC MICROSURGERY N/A 01/06/2017   Procedure: PARTIAL PROCTECTOMY OF EARLY RECTAL CANCER, TRANSANAL ENDOSCOPIC MICROSURGERY  ERAS PATHWAY;  Surgeon: Michael Boston, MD;  Location: [facility];  Service: General;  Laterality: N/A;  ERAS PATHWAY  . UPPER GASTROINTESTINAL ENDOSCOPY      Social  History Brendan Meyer  reports that he has never smoked. He has never used smokeless tobacco. He reports that he does not drink alcohol or use drugs.  family history includes Breast cancer in his maternal aunt.  No Known Allergies     PHYSICAL EXAMINATION: Vital signs: BP 124/60   Pulse 68   Ht 5\' 11"  (1.803 m)   Wt 153 lb (69.4 kg)   BMI 21.34 kg/m   Constitutional: generally well-appearing, no acute distress Psychiatric: alert and oriented x3, cooperative Eyes: extraocular movements intact, anicteric, conjunctiva pink Mouth: oral pharynx moist, no lesions Neck: supple no lymphadenopathy Cardiovascular: heart regular rate and rhythm, no murmur Lungs: clear to auscultation bilaterally Abdomen: soft, nontender, nondistended, no obvious ascites, no peritoneal signs, normal bowel sounds, no organomegaly Rectal:deferred until colonoscopy Extremities: no lower extremity edema bilaterally Skin: no lesions on visible extremities Neuro: No focal deficits. No asterixis.    ASSESSMENT:  #1. Rectal cancer status post transanal incision followed by adjuvant chemoradiation therapy. In clinical remission #2. History of adenomatous colon polyps #3. Multiple medical problems   PLAN:  #1. Surveillance colonoscopy.The nature of the procedure, as well as the risks, benefits, and alternatives were carefully and thoroughly reviewed with the patient. Ample time for discussion and questions allowed. The patient understood, was satisfied, and agreed to proceed.

## 2018-01-26 DIAGNOSIS — H04123 Dry eye syndrome of bilateral lacrimal glands: Secondary | ICD-10-CM | POA: Diagnosis not present

## 2018-01-26 DIAGNOSIS — H401131 Primary open-angle glaucoma, bilateral, mild stage: Secondary | ICD-10-CM | POA: Diagnosis not present

## 2018-01-26 DIAGNOSIS — Z961 Presence of intraocular lens: Secondary | ICD-10-CM | POA: Diagnosis not present

## 2018-01-26 DIAGNOSIS — H2511 Age-related nuclear cataract, right eye: Secondary | ICD-10-CM | POA: Diagnosis not present

## 2018-02-05 DIAGNOSIS — E1129 Type 2 diabetes mellitus with other diabetic kidney complication: Secondary | ICD-10-CM | POA: Diagnosis not present

## 2018-02-05 DIAGNOSIS — R339 Retention of urine, unspecified: Secondary | ICD-10-CM | POA: Diagnosis not present

## 2018-02-05 DIAGNOSIS — I4891 Unspecified atrial fibrillation: Secondary | ICD-10-CM | POA: Diagnosis not present

## 2018-02-05 DIAGNOSIS — I25810 Atherosclerosis of coronary artery bypass graft(s) without angina pectoris: Secondary | ICD-10-CM | POA: Diagnosis not present

## 2018-02-05 DIAGNOSIS — E7849 Other hyperlipidemia: Secondary | ICD-10-CM | POA: Diagnosis not present

## 2018-02-05 DIAGNOSIS — C20 Malignant neoplasm of rectum: Secondary | ICD-10-CM | POA: Diagnosis not present

## 2018-02-05 DIAGNOSIS — E559 Vitamin D deficiency, unspecified: Secondary | ICD-10-CM | POA: Diagnosis not present

## 2018-02-05 DIAGNOSIS — N08 Glomerular disorders in diseases classified elsewhere: Secondary | ICD-10-CM | POA: Diagnosis not present

## 2018-02-05 DIAGNOSIS — E1142 Type 2 diabetes mellitus with diabetic polyneuropathy: Secondary | ICD-10-CM | POA: Diagnosis not present

## 2018-03-09 ENCOUNTER — Encounter: Payer: Self-pay | Admitting: Internal Medicine

## 2018-03-12 DIAGNOSIS — B3749 Other urogenital candidiasis: Secondary | ICD-10-CM | POA: Diagnosis not present

## 2018-03-12 DIAGNOSIS — N400 Enlarged prostate without lower urinary tract symptoms: Secondary | ICD-10-CM | POA: Diagnosis not present

## 2018-03-12 DIAGNOSIS — R9349 Abnormal radiologic findings on diagnostic imaging of other urinary organs: Secondary | ICD-10-CM | POA: Diagnosis not present

## 2018-03-12 DIAGNOSIS — R8290 Unspecified abnormal findings in urine: Secondary | ICD-10-CM | POA: Diagnosis not present

## 2018-03-22 ENCOUNTER — Ambulatory Visit (AMBULATORY_SURGERY_CENTER): Payer: Medicare HMO | Admitting: Internal Medicine

## 2018-03-22 ENCOUNTER — Encounter: Payer: Self-pay | Admitting: Internal Medicine

## 2018-03-22 VITALS — BP 156/82 | HR 78 | Temp 97.1°F | Resp 16 | Ht 71.0 in | Wt 153.0 lb

## 2018-03-22 DIAGNOSIS — Z86010 Personal history of colonic polyps: Secondary | ICD-10-CM

## 2018-03-22 DIAGNOSIS — C20 Malignant neoplasm of rectum: Secondary | ICD-10-CM | POA: Diagnosis not present

## 2018-03-22 DIAGNOSIS — Z85048 Personal history of other malignant neoplasm of rectum, rectosigmoid junction, and anus: Secondary | ICD-10-CM | POA: Diagnosis not present

## 2018-03-22 DIAGNOSIS — E119 Type 2 diabetes mellitus without complications: Secondary | ICD-10-CM | POA: Diagnosis not present

## 2018-03-22 DIAGNOSIS — I4891 Unspecified atrial fibrillation: Secondary | ICD-10-CM | POA: Diagnosis not present

## 2018-03-22 DIAGNOSIS — I10 Essential (primary) hypertension: Secondary | ICD-10-CM | POA: Diagnosis not present

## 2018-03-22 DIAGNOSIS — J449 Chronic obstructive pulmonary disease, unspecified: Secondary | ICD-10-CM | POA: Diagnosis not present

## 2018-03-22 HISTORY — PX: COLONOSCOPY: SHX174

## 2018-03-22 MED ORDER — SODIUM CHLORIDE 0.9 % IV SOLN: 500.0000 mL | Freq: Once | INTRAVENOUS | Status: DC

## 2018-03-22 NOTE — Progress Notes (Signed)
Report to PACU, RN, vss, BBS= Clear.  

## 2018-03-22 NOTE — Op Note (Signed)
[facility] Patient Name: Brendan Meyer Procedure Date: 03/22/2018 1:21 PM MRN: [id number] Endoscopist: Docia Chuck. Henrene Pastor , MD Age: 83 Referring MD:  Date of Birth: December 27, 1934 Gender: Male Account #: [id number] Procedure:                Colonoscopy Indications:              High risk colon cancer surveillance: Personal                            history of colon cancer. Rectal cancer diagnosed                            May 2018. Resection July 2018. Completed adjuvant                            chemotherapy and radiation therapy. Now for                            follow-up Medicines:                Monitored Anesthesia Care Procedure:                Pre-Anesthesia Assessment:                           - Prior to the procedure, a History and Physical                            was performed, and patient medications and                            allergies were reviewed. The patient's tolerance of                            previous anesthesia was also reviewed. The risks                            and benefits of the procedure and the sedation                            options and risks were discussed with the patient.                            All questions were answered, and informed consent                            was obtained. Prior Anticoagulants: The patient has                            taken no previous anticoagulant or antiplatelet                            agents. ASA Grade Assessment: II - A patient with  mild systemic disease. After reviewing the risks                            and benefits, the patient was deemed in                            satisfactory condition to undergo the procedure.                           After obtaining informed consent, the colonoscope                            was passed under direct vision. Throughout the                            procedure, the patient's blood pressure, pulse, and                   oxygen saturations were monitored continuously. The                            Model CF-HQ190L [id number]) scope was introduced                            through the anus and advanced to the the cecum,                            identified by appendiceal orifice and ileocecal                            valve. The ileocecal valve, appendiceal orifice,                            and rectum were photographed. The quality of the                            bowel preparation was excellent. The colonoscopy                            was performed without difficulty. The patient                            tolerated the procedure well. The bowel preparation                            used was SUPREP. Scope In: 1:36:33 PM Scope Out: 1:52:02 PM Scope Withdrawal Time: 0 hours 8 minutes 55 seconds  Total Procedure Duration: 0 hours 15 minutes 29 seconds  Findings:                 Multiple diverticula were found in the left colon                            and right colon.  The exam was otherwise without abnormality on                            direct views. No retroflexion due to altered rectal                            anatomy. Scarring and tattoo from previous area of                            concern noted. No neoplasia. Complications:            No immediate complications. Estimated blood loss:                            None. Estimated Blood Loss:     Estimated blood loss: none. Impression:               - Diverticulosis in the left colon and in the right                            colon.                           - The examination was otherwise normal, With                            surgical scarring and tattooing noted in the rectum                            - No specimens collected. Recommendation:           - Repeat colonoscopy in 1 year for surveillance                            (PEDIATRIC SCOPE).                           - Patient has a  contact number available for                            emergencies. The signs and symptoms of potential                            delayed complications were discussed with the                            patient. Return to normal activities tomorrow.                            Written discharge instructions were provided to the                            patient.                           - Resume previous diet.                           -  Continue present medications. Docia Chuck. Henrene Pastor, MD 03/22/2018 1:59:16 PM This report has been signed electronically.

## 2018-03-22 NOTE — Patient Instructions (Signed)
YOU HAD AN ENDOSCOPIC PROCEDURE TODAY AT THE  ENDOSCOPY CENTER:   Refer to the procedure report that was given to you for any specific questions about what was found during the examination.  If the procedure report does not answer your questions, please call your gastroenterologist to clarify.  If you requested that your care partner not be given the details of your procedure findings, then the procedure report has been included in a sealed envelope for you to review at your convenience later.  YOU SHOULD EXPECT: Some feelings of bloating in the abdomen. Passage of more gas than usual.  Walking can help get rid of the air that was put into your GI tract during the procedure and reduce the bloating. If you had a lower endoscopy (such as a colonoscopy or flexible sigmoidoscopy) you may notice spotting of blood in your stool or on the toilet paper. If you underwent a bowel prep for your procedure, you may not have a normal bowel movement for a few days.  Please Note:  You might notice some irritation and congestion in your nose or some drainage.  This is from the oxygen used during your procedure.  There is no need for concern and it should clear up in a day or so.  SYMPTOMS TO REPORT IMMEDIATELY:   Following lower endoscopy (colonoscopy or flexible sigmoidoscopy):  Excessive amounts of blood in the stool  Significant tenderness or worsening of abdominal pains  Swelling of the abdomen that is new, acute  Fever of 100F or higher  For urgent or emergent issues, a gastroenterologist can be reached at any hour by calling [phone].   DIET:  We do recommend a small meal at first, but then you may proceed to your regular diet.  Drink plenty of fluids but you should avoid alcoholic beverages for 24 hours.  MEDICATIONS: Continue present medications.  Please see handouts given to you by your recovery nurse.  ACTIVITY:  You should plan to take it easy for the rest of today and you should NOT  DRIVE or use heavy machinery until tomorrow (because of the sedation medicines used during the test).    FOLLOW UP: Our staff will call the number listed on your records the next business day following your procedure to check on you and address any questions or concerns that you may have regarding the information given to you following your procedure. If we do not reach you, we will leave a message.  However, if you are feeling well and you are not experiencing any problems, there is no need to return our call.  We will assume that you have returned to your regular daily activities without incident.  If any biopsies were taken you will be contacted by phone or by letter within the next 1-3 weeks.  Please call us at [phone] if you have not heard about the biopsies in 3 weeks.   Thank you for allowing us to provide for your healthcare needs today.   SIGNATURES/CONFIDENTIALITY: You and/or your care partner have signed paperwork which will be entered into your electronic medical record.  These signatures attest to the fact that that the information above on your After Visit Summary has been reviewed and is understood.  Full responsibility of the confidentiality of this discharge information lies with you and/or your care-partner. 

## 2018-03-23 ENCOUNTER — Telehealth: Payer: Self-pay

## 2018-03-23 NOTE — Telephone Encounter (Signed)
  Follow up Call-  Call back number 03/22/2018 11/24/2016  Post procedure Call Back phone  # [phone]  Permission to leave phone message Yes Yes  Some recent data might be hidden     Patient questions:  Do you have a fever, pain , or abdominal swelling? No. Pain Score  0 *  Have you tolerated food without any problems? Yes.    Have you been able to return to your normal activities? Yes.    Do you have any questions about your discharge instructions: Diet   No. Medications  No. Follow up visit  No.  Do you have questions or concerns about your Care? No.  Actions: * If pain score is 4 or above: No action needed, pain <4.

## 2018-03-30 DIAGNOSIS — N401 Enlarged prostate with lower urinary tract symptoms: Secondary | ICD-10-CM | POA: Diagnosis not present

## 2018-03-30 DIAGNOSIS — Z923 Personal history of irradiation: Secondary | ICD-10-CM | POA: Diagnosis not present

## 2018-03-30 DIAGNOSIS — N390 Urinary tract infection, site not specified: Secondary | ICD-10-CM | POA: Diagnosis not present

## 2018-03-30 DIAGNOSIS — N138 Other obstructive and reflux uropathy: Secondary | ICD-10-CM | POA: Diagnosis not present

## 2018-03-30 DIAGNOSIS — C20 Malignant neoplasm of rectum: Secondary | ICD-10-CM | POA: Diagnosis not present

## 2018-04-16 DIAGNOSIS — T148XXA Other injury of unspecified body region, initial encounter: Secondary | ICD-10-CM | POA: Diagnosis not present

## 2018-05-11 DIAGNOSIS — R339 Retention of urine, unspecified: Secondary | ICD-10-CM | POA: Diagnosis not present

## 2018-05-11 DIAGNOSIS — B3741 Candidal cystitis and urethritis: Secondary | ICD-10-CM | POA: Diagnosis not present

## 2018-05-11 DIAGNOSIS — N138 Other obstructive and reflux uropathy: Secondary | ICD-10-CM | POA: Diagnosis not present

## 2018-05-11 DIAGNOSIS — N401 Enlarged prostate with lower urinary tract symptoms: Secondary | ICD-10-CM | POA: Diagnosis not present

## 2018-05-26 ENCOUNTER — Telehealth: Payer: Self-pay | Admitting: Oncology

## 2018-05-26 NOTE — Telephone Encounter (Signed)
Patient called to cancel °

## 2018-05-28 ENCOUNTER — Inpatient Hospital Stay: Payer: Medicare HMO

## 2018-05-28 ENCOUNTER — Inpatient Hospital Stay: Payer: Medicare HMO | Admitting: Oncology

## 2018-06-07 DIAGNOSIS — E1140 Type 2 diabetes mellitus with diabetic neuropathy, unspecified: Secondary | ICD-10-CM | POA: Diagnosis not present

## 2018-06-07 DIAGNOSIS — C20 Malignant neoplasm of rectum: Secondary | ICD-10-CM | POA: Diagnosis not present

## 2018-06-07 DIAGNOSIS — I2510 Atherosclerotic heart disease of native coronary artery without angina pectoris: Secondary | ICD-10-CM | POA: Diagnosis not present

## 2018-06-07 DIAGNOSIS — N401 Enlarged prostate with lower urinary tract symptoms: Secondary | ICD-10-CM | POA: Diagnosis not present

## 2018-06-07 DIAGNOSIS — E1149 Type 2 diabetes mellitus with other diabetic neurological complication: Secondary | ICD-10-CM | POA: Diagnosis not present

## 2018-06-07 DIAGNOSIS — I4891 Unspecified atrial fibrillation: Secondary | ICD-10-CM | POA: Diagnosis not present

## 2018-06-07 DIAGNOSIS — E1151 Type 2 diabetes mellitus with diabetic peripheral angiopathy without gangrene: Secondary | ICD-10-CM | POA: Diagnosis not present

## 2018-06-07 DIAGNOSIS — E7849 Other hyperlipidemia: Secondary | ICD-10-CM | POA: Diagnosis not present

## 2018-06-11 DIAGNOSIS — N471 Phimosis: Secondary | ICD-10-CM | POA: Diagnosis not present

## 2018-06-11 DIAGNOSIS — R8290 Unspecified abnormal findings in urine: Secondary | ICD-10-CM | POA: Diagnosis not present

## 2018-06-11 DIAGNOSIS — N481 Balanitis: Secondary | ICD-10-CM | POA: Diagnosis not present

## 2018-06-11 DIAGNOSIS — R350 Frequency of micturition: Secondary | ICD-10-CM | POA: Diagnosis not present

## 2018-06-11 DIAGNOSIS — B3749 Other urogenital candidiasis: Secondary | ICD-10-CM | POA: Diagnosis not present

## 2018-06-11 DIAGNOSIS — R3915 Urgency of urination: Secondary | ICD-10-CM | POA: Diagnosis not present

## 2018-06-22 DIAGNOSIS — B3749 Other urogenital candidiasis: Secondary | ICD-10-CM | POA: Diagnosis not present

## 2018-06-22 DIAGNOSIS — N401 Enlarged prostate with lower urinary tract symptoms: Secondary | ICD-10-CM | POA: Diagnosis not present

## 2018-06-22 DIAGNOSIS — R3915 Urgency of urination: Secondary | ICD-10-CM | POA: Diagnosis not present

## 2018-06-22 DIAGNOSIS — N471 Phimosis: Secondary | ICD-10-CM | POA: Diagnosis not present

## 2018-06-22 DIAGNOSIS — N138 Other obstructive and reflux uropathy: Secondary | ICD-10-CM | POA: Diagnosis not present

## 2018-06-22 DIAGNOSIS — R350 Frequency of micturition: Secondary | ICD-10-CM | POA: Diagnosis not present

## 2018-07-19 DIAGNOSIS — E113292 Type 2 diabetes mellitus with mild nonproliferative diabetic retinopathy without macular edema, left eye: Secondary | ICD-10-CM | POA: Diagnosis not present

## 2018-07-19 DIAGNOSIS — H524 Presbyopia: Secondary | ICD-10-CM | POA: Diagnosis not present

## 2018-07-21 DIAGNOSIS — N1370 Vesicoureteral-reflux, unspecified: Secondary | ICD-10-CM | POA: Diagnosis not present

## 2018-07-21 DIAGNOSIS — N481 Balanitis: Secondary | ICD-10-CM | POA: Diagnosis not present

## 2018-07-21 DIAGNOSIS — N471 Phimosis: Secondary | ICD-10-CM | POA: Diagnosis not present

## 2018-07-21 DIAGNOSIS — N390 Urinary tract infection, site not specified: Secondary | ICD-10-CM | POA: Diagnosis not present

## 2018-07-21 DIAGNOSIS — R338 Other retention of urine: Secondary | ICD-10-CM | POA: Diagnosis not present

## 2018-07-21 DIAGNOSIS — R319 Hematuria, unspecified: Secondary | ICD-10-CM | POA: Diagnosis not present

## 2018-07-21 DIAGNOSIS — N138 Other obstructive and reflux uropathy: Secondary | ICD-10-CM | POA: Diagnosis not present

## 2018-07-21 DIAGNOSIS — N401 Enlarged prostate with lower urinary tract symptoms: Secondary | ICD-10-CM | POA: Diagnosis not present

## 2018-08-02 DIAGNOSIS — N1370 Vesicoureteral-reflux, unspecified: Secondary | ICD-10-CM | POA: Diagnosis not present

## 2018-08-02 DIAGNOSIS — N1339 Other hydronephrosis: Secondary | ICD-10-CM | POA: Diagnosis not present

## 2018-08-04 DIAGNOSIS — N1370 Vesicoureteral-reflux, unspecified: Secondary | ICD-10-CM | POA: Diagnosis not present

## 2018-08-09 DIAGNOSIS — Z08 Encounter for follow-up examination after completed treatment for malignant neoplasm: Secondary | ICD-10-CM | POA: Diagnosis not present

## 2018-08-09 DIAGNOSIS — L821 Other seborrheic keratosis: Secondary | ICD-10-CM | POA: Diagnosis not present

## 2018-08-09 DIAGNOSIS — Z86008 Personal history of in-situ neoplasm of other site: Secondary | ICD-10-CM | POA: Diagnosis not present

## 2018-08-09 DIAGNOSIS — L570 Actinic keratosis: Secondary | ICD-10-CM | POA: Diagnosis not present

## 2018-08-09 DIAGNOSIS — L820 Inflamed seborrheic keratosis: Secondary | ICD-10-CM | POA: Diagnosis not present

## 2018-08-28 ENCOUNTER — Encounter (HOSPITAL_BASED_OUTPATIENT_CLINIC_OR_DEPARTMENT_OTHER): Payer: Self-pay | Admitting: Adult Health

## 2018-08-28 ENCOUNTER — Other Ambulatory Visit: Payer: Self-pay

## 2018-08-28 ENCOUNTER — Emergency Department (HOSPITAL_BASED_OUTPATIENT_CLINIC_OR_DEPARTMENT_OTHER)
Admission: EM | Admit: 2018-08-28 | Discharge: 2018-08-28 | Disposition: A | Discharge status: Home / Self Care | Payer: Medicare HMO | Attending: Emergency Medicine | Admitting: Emergency Medicine

## 2018-08-28 DIAGNOSIS — Z951 Presence of aortocoronary bypass graft: Secondary | ICD-10-CM | POA: Insufficient documentation

## 2018-08-28 DIAGNOSIS — Z7982 Long term (current) use of aspirin: Secondary | ICD-10-CM | POA: Insufficient documentation

## 2018-08-28 DIAGNOSIS — T83091A Other mechanical complication of indwelling urethral catheter, initial encounter: Secondary | ICD-10-CM | POA: Diagnosis not present

## 2018-08-28 DIAGNOSIS — Z79899 Other long term (current) drug therapy: Secondary | ICD-10-CM | POA: Diagnosis not present

## 2018-08-28 DIAGNOSIS — Y732 Prosthetic and other implants, materials and accessory gastroenterology and urology devices associated with adverse incidents: Secondary | ICD-10-CM | POA: Diagnosis not present

## 2018-08-28 DIAGNOSIS — Z85040 Personal history of malignant carcinoid tumor of rectum: Secondary | ICD-10-CM | POA: Insufficient documentation

## 2018-08-28 DIAGNOSIS — I2510 Atherosclerotic heart disease of native coronary artery without angina pectoris: Secondary | ICD-10-CM | POA: Diagnosis not present

## 2018-08-28 DIAGNOSIS — Z7984 Long term (current) use of oral hypoglycemic drugs: Secondary | ICD-10-CM | POA: Diagnosis not present

## 2018-08-28 DIAGNOSIS — T83098A Other mechanical complication of other indwelling urethral catheter, initial encounter: Secondary | ICD-10-CM | POA: Diagnosis not present

## 2018-08-28 DIAGNOSIS — E119 Type 2 diabetes mellitus without complications: Secondary | ICD-10-CM | POA: Diagnosis not present

## 2018-08-28 DIAGNOSIS — R339 Retention of urine, unspecified: Secondary | ICD-10-CM | POA: Diagnosis present

## 2018-08-28 LAB — URINALYSIS, ROUTINE W REFLEX MICROSCOPIC
Bilirubin Urine: NEGATIVE
Glucose, UA: >=500 mg/dL — AB
Ketones, ur: NEGATIVE mg/dL
NITRITE: NEGATIVE
PROTEIN: 100 mg/dL — AB
Specific Gravity, Urine: 1.02 (ref 1.005–1.030)
pH: 6 (ref 5.0–8.0)

## 2018-08-28 LAB — URINALYSIS, MICROSCOPIC (REFLEX)

## 2018-08-28 NOTE — Discharge Instructions (Addendum)
Return immediately for inability to urinate, fever, persistent vomiting or for any concerns.  Follow-up with your urologist.

## 2018-08-28 NOTE — ED Provider Notes (Signed)
[facility] EMERGENCY DEPARTMENT Provider Note   CSN: [id number] Arrival date & time: 08/28/18  1131    History   Chief Complaint Chief Complaint  Patient presents with  . Urinary Retention    HPI Brendan Meyer is a 84 y.o. male.     HPI Patient states he is had a Foley catheter in place for the last 10 weeks.  Was unable to urinate this morning.  Thinks the catheter became obstructed.  Denies any blood in the urine recently.  No fever or chills.  No abdominal pain.  No nausea or vomiting. Past Medical History:  Diagnosis Date  . BPH (benign prostatic hyperplasia)   . Cataract 08/18/2016   cataract removal with implant left eye  . Coronary artery disease    s/p CABG  . Diabetes mellitus    type II   . Dysrhythmia    hx of afib post  [facility] in 2012   . History of kidney stones   . Hyperlipidemia   . Kidney stones   . Neuropathy     Patient Active Problem List   Diagnosis Date Noted  . Rectal cancer uT1uN0 s/p TEM partial proctectomy 01/06/2017 12/11/2016  . Hematuria 11/12/2013  . Urinary retention 11/12/2013  . Coronary atherosclerosis of native coronary artery 05/12/2011  . Skin nodule 05/12/2011  . UNSPECIFIED VISUAL DISTURBANCE 02/26/2010  . CAD, ARTERY BYPASS GRAFT 02/26/2010  . CORONARY ARTERY ANEURYSM 02/26/2010  . CARDIOMYOPATHY, ISCHEMIC 02/26/2010  . ATRIAL FIBRILLATION 02/26/2010  . UNSTABLE ANGINA 02/05/2010  . HYPERLIPIDEMIA-MIXED 11/29/2009  . DYSPNEA 11/29/2009  . Nonspecific (abnormal) findings on radiological and other examination of body structure 11/29/2009  . CT, CHEST, ABNORMAL 11/29/2009  . GERD 09/03/2009  . DIVERTICULOSIS-COLON 09/03/2009  . PERSONAL HX COLONIC POLYPS 09/03/2009  . DIABETES MELLITUS 07/12/2009  . WEIGHT LOSS-ABNORMAL 07/12/2009  . NAUSEA 07/12/2009  . ABDOMINAL PAIN -GENERALIZED 07/12/2009    Past Surgical History:  Procedure Laterality Date  . CIRCUMCISION    . COLONOSCOPY    . CORONARY ARTERY  BYPASS GRAFT  02/2011   x 6 vessel  . CYSTOSCOPY    . CYSTOSCOPY/RETROGRADE/URETEROSCOPY Bilateral 11/11/2013   Procedure: CYSTOSCOPY, BILATERAL RETROGRADE PYELOGRAM LEFT URETEROSCOPY, COLLECTION OF LEFT URETERAL WASHINGS FOR CYTOLOGY;  Surgeon: Ardis Hughs, MD;  Location: [facility];  Service: Urology;  Laterality: Bilateral;  . eardrum    . EUS N/A 12/18/2016   Procedure: LOWER ENDOSCOPIC ULTRASOUND (EUS);  Surgeon: Milus Banister, MD;  Location: [facility];  Service: Endoscopy;  Laterality: N/A;  . HERNIA REPAIR     double  . LITHOTRIPSY    . PATENT FORAMEN OVALE CLOSURE  2011  . POLYPECTOMY    . TONSILLECTOMY    . TRANSANAL ENDOSCOPIC MICROSURGERY N/A 01/06/2017   Procedure: PARTIAL PROCTECTOMY OF EARLY RECTAL CANCER, TRANSANAL ENDOSCOPIC MICROSURGERY  ERAS PATHWAY;  Surgeon: Michael Boston, MD;  Location: [facility];  Service: General;  Laterality: N/A;  ERAS PATHWAY  . UPPER GASTROINTESTINAL ENDOSCOPY          Home Medications    Prior to Admission medications   Medication Sig Start Date End Date Taking? Authorizing Provider  aspirin EC 81 MG tablet Take 81 mg by mouth daily.    [provider]  atorvastatin (LIPITOR) 20 MG tablet Take 20 mg by mouth daily. 08/12/17   [provider]  Cholecalciferol (VITAMIN D) 2000 UNITS CAPS Take 2,000 Units by mouth daily.    [provider]  latanoprost (XALATAN) 0.005 %  ophthalmic solution Place 1 drop into both eyes at bedtime. 09/02/17   [provider]  metFORMIN (GLUCOPHAGE) 500 MG tablet Take 500 mg by mouth 2 (two) times daily with a meal.      [provider]  metoprolol tartrate (LOPRESSOR) 25 MG tablet Take 25 mg by mouth at bedtime.  02/12/11   Bensimhon, Shaune Pascal, MD  oxycodone (OXY-IR) 5 MG capsule Take 5 mg by mouth every 4 (four) hours as needed.    [provider]  simvastatin (ZOCOR) 40 MG tablet Take 40 mg by mouth every evening.     [provider]  TRESIBA  FLEXTOUCH 200 UNIT/ML SOPN Inject 35 Units into the skin every evening. 08/17/17   [provider]  zolpidem (AMBIEN) 10 MG tablet Take 10 mg by mouth at bedtime as needed for sleep.     [provider]    Family History Family History  Problem Relation Age of Onset  . Breast cancer Maternal Aunt   . Colon cancer Neg Hx   . Colon polyps Neg Hx   . Esophageal cancer Neg Hx   . Rectal cancer Neg Hx   . Stomach cancer Neg Hx     Social History Social History   Tobacco Use  . Smoking status: Never Smoker  . Smokeless tobacco: Never Used  Substance Use Topics  . Alcohol use: No  . Drug use: No     Allergies   Patient has no known allergies.   Review of Systems Review of Systems  Constitutional: Negative for chills and fever.  Gastrointestinal: Negative for abdominal pain, nausea and vomiting.  Genitourinary: Positive for difficulty urinating. Negative for dysuria, flank pain, hematuria and penile pain.  Musculoskeletal: Negative for myalgias.  Skin: Negative for rash and wound.  All other systems reviewed and are negative.    Physical Exam Updated Vital Signs BP (!) 176/80   Pulse (!) 104   Temp 98.1 F (36.7 C) (Oral)   Resp (!) 22   Ht 5\' 9"  (1.753 m)   Wt 68 kg   SpO2 100%   BMI 22.15 kg/m   Physical Exam Vitals signs and nursing note reviewed.  Constitutional:      Appearance: Normal appearance. He is well-developed.  HENT:     Head: Normocephalic and atraumatic.  Eyes:     Pupils: Pupils are equal, round, and reactive to light.  Neck:     Musculoskeletal: Normal range of motion and neck supple.  Cardiovascular:     Rate and Rhythm: Normal rate and regular rhythm.  Pulmonary:     Effort: Pulmonary effort is normal.     Breath sounds: Normal breath sounds.  Abdominal:     General: Bowel sounds are normal.     Palpations: Abdomen is soft.     Tenderness: There is no abdominal tenderness. There is no right CVA tenderness, left CVA  tenderness, guarding or rebound.  Genitourinary:    Penis: Normal.   Musculoskeletal: Normal range of motion.        General: No swelling or tenderness.  Skin:    General: Skin is warm and dry.     Findings: No erythema or rash.  Neurological:     General: No focal deficit present.     Mental Status: He is alert and oriented to person, place, and time.  Psychiatric:        Behavior: Behavior normal.      ED Treatments / Results  Labs (all  labs ordered are listed, but only abnormal results are displayed) Labs Reviewed  URINALYSIS, ROUTINE W REFLEX MICROSCOPIC    EKG None  Radiology No results found.  Procedures Procedures (including critical care time)  Medications Ordered in ED Medications - No data to display   Initial Impression / Assessment and Plan / ED Course  I have reviewed the triage vital signs and the nursing notes.  Pertinent labs & imaging results that were available during my care of the patient were reviewed by me and considered in my medical decision making (see chart for details).        Foley catheter was removed and patient states he is feeling better.  He has been able to urinate 100 cc in the emergency department.  He wants to leave.  He has appointment to follow-up with his urologist.  He will follow-up with urologist regarding UA.  Strict return precautions have been given.  Final Clinical Impressions(s) / ED Diagnoses   Final diagnoses:  Obstruction of Foley catheter, initial encounter [facility])    ED Discharge Orders    None       Julianne Rice, MD 08/28/18 1205

## 2018-08-28 NOTE — ED Triage Notes (Signed)
C/o severe pain at the urinary catheter site and abdominal discomfort. CAtheter was removed per pt request. The patient has had the catheter for 5 weeks. After catheter removal he reports the pain is much better. Bladder scan showed 160ml.

## 2018-09-02 DIAGNOSIS — R3915 Urgency of urination: Secondary | ICD-10-CM | POA: Diagnosis not present

## 2018-09-02 DIAGNOSIS — N1370 Vesicoureteral-reflux, unspecified: Secondary | ICD-10-CM | POA: Diagnosis not present

## 2018-09-02 DIAGNOSIS — N390 Urinary tract infection, site not specified: Secondary | ICD-10-CM | POA: Diagnosis not present

## 2018-09-02 DIAGNOSIS — R350 Frequency of micturition: Secondary | ICD-10-CM | POA: Diagnosis not present

## 2018-09-02 DIAGNOSIS — B3749 Other urogenital candidiasis: Secondary | ICD-10-CM | POA: Diagnosis not present

## 2018-09-13 DIAGNOSIS — R319 Hematuria, unspecified: Secondary | ICD-10-CM | POA: Diagnosis not present

## 2018-09-13 DIAGNOSIS — B3749 Other urogenital candidiasis: Secondary | ICD-10-CM | POA: Diagnosis not present

## 2018-09-13 DIAGNOSIS — Z87440 Personal history of urinary (tract) infections: Secondary | ICD-10-CM | POA: Diagnosis not present

## 2018-09-13 DIAGNOSIS — N390 Urinary tract infection, site not specified: Secondary | ICD-10-CM | POA: Diagnosis not present

## 2018-09-13 DIAGNOSIS — N1370 Vesicoureteral-reflux, unspecified: Secondary | ICD-10-CM | POA: Diagnosis not present

## 2018-09-13 DIAGNOSIS — R8290 Unspecified abnormal findings in urine: Secondary | ICD-10-CM | POA: Diagnosis not present

## 2018-09-13 DIAGNOSIS — Z8619 Personal history of other infectious and parasitic diseases: Secondary | ICD-10-CM | POA: Diagnosis not present

## 2018-09-23 DIAGNOSIS — Z01810 Encounter for preprocedural cardiovascular examination: Secondary | ICD-10-CM | POA: Diagnosis not present

## 2018-09-23 DIAGNOSIS — R9431 Abnormal electrocardiogram [ECG] [EKG]: Secondary | ICD-10-CM | POA: Diagnosis not present

## 2018-10-26 DIAGNOSIS — E785 Hyperlipidemia, unspecified: Secondary | ICD-10-CM | POA: Diagnosis not present

## 2018-10-26 DIAGNOSIS — R310 Gross hematuria: Secondary | ICD-10-CM | POA: Diagnosis not present

## 2018-10-26 DIAGNOSIS — C20 Malignant neoplasm of rectum: Secondary | ICD-10-CM | POA: Diagnosis not present

## 2018-10-26 DIAGNOSIS — N08 Glomerular disorders in diseases classified elsewhere: Secondary | ICD-10-CM | POA: Diagnosis not present

## 2018-10-26 DIAGNOSIS — E1142 Type 2 diabetes mellitus with diabetic polyneuropathy: Secondary | ICD-10-CM | POA: Diagnosis not present

## 2018-10-26 DIAGNOSIS — I25810 Atherosclerosis of coronary artery bypass graft(s) without angina pectoris: Secondary | ICD-10-CM | POA: Diagnosis not present

## 2018-10-26 DIAGNOSIS — I4891 Unspecified atrial fibrillation: Secondary | ICD-10-CM | POA: Diagnosis not present

## 2018-10-26 DIAGNOSIS — E1129 Type 2 diabetes mellitus with other diabetic kidney complication: Secondary | ICD-10-CM | POA: Diagnosis not present

## 2018-10-26 DIAGNOSIS — N401 Enlarged prostate with lower urinary tract symptoms: Secondary | ICD-10-CM | POA: Diagnosis not present

## 2018-10-27 DIAGNOSIS — E7849 Other hyperlipidemia: Secondary | ICD-10-CM | POA: Diagnosis not present

## 2018-10-27 DIAGNOSIS — E1129 Type 2 diabetes mellitus with other diabetic kidney complication: Secondary | ICD-10-CM | POA: Diagnosis not present

## 2018-10-27 DIAGNOSIS — E559 Vitamin D deficiency, unspecified: Secondary | ICD-10-CM | POA: Diagnosis not present

## 2018-10-28 DIAGNOSIS — R350 Frequency of micturition: Secondary | ICD-10-CM | POA: Diagnosis not present

## 2018-10-28 DIAGNOSIS — N130 Hydronephrosis with ureteropelvic junction obstruction: Secondary | ICD-10-CM | POA: Diagnosis not present

## 2018-10-28 DIAGNOSIS — R310 Gross hematuria: Secondary | ICD-10-CM | POA: Diagnosis not present

## 2018-10-28 DIAGNOSIS — N401 Enlarged prostate with lower urinary tract symptoms: Secondary | ICD-10-CM | POA: Diagnosis not present

## 2018-10-28 DIAGNOSIS — N200 Calculus of kidney: Secondary | ICD-10-CM | POA: Diagnosis not present

## 2018-11-01 DIAGNOSIS — N1330 Unspecified hydronephrosis: Secondary | ICD-10-CM | POA: Diagnosis not present

## 2018-11-01 DIAGNOSIS — R310 Gross hematuria: Secondary | ICD-10-CM | POA: Diagnosis not present

## 2018-11-05 DIAGNOSIS — N401 Enlarged prostate with lower urinary tract symptoms: Secondary | ICD-10-CM | POA: Diagnosis not present

## 2018-11-05 DIAGNOSIS — R350 Frequency of micturition: Secondary | ICD-10-CM | POA: Diagnosis not present

## 2018-11-08 DIAGNOSIS — R8279 Other abnormal findings on microbiological examination of urine: Secondary | ICD-10-CM | POA: Diagnosis not present

## 2018-11-08 DIAGNOSIS — R350 Frequency of micturition: Secondary | ICD-10-CM | POA: Diagnosis not present

## 2018-11-08 DIAGNOSIS — N401 Enlarged prostate with lower urinary tract symptoms: Secondary | ICD-10-CM | POA: Diagnosis not present

## 2018-11-16 DIAGNOSIS — R8279 Other abnormal findings on microbiological examination of urine: Secondary | ICD-10-CM | POA: Diagnosis not present

## 2018-11-22 DIAGNOSIS — N3001 Acute cystitis with hematuria: Secondary | ICD-10-CM | POA: Diagnosis not present

## 2018-11-22 DIAGNOSIS — C20 Malignant neoplasm of rectum: Secondary | ICD-10-CM | POA: Diagnosis not present

## 2018-12-13 DIAGNOSIS — R8279 Other abnormal findings on microbiological examination of urine: Secondary | ICD-10-CM | POA: Diagnosis not present

## 2019-01-04 DIAGNOSIS — R338 Other retention of urine: Secondary | ICD-10-CM | POA: Diagnosis not present

## 2019-01-12 DIAGNOSIS — R338 Other retention of urine: Secondary | ICD-10-CM | POA: Diagnosis not present

## 2019-01-14 DIAGNOSIS — I4891 Unspecified atrial fibrillation: Secondary | ICD-10-CM | POA: Diagnosis not present

## 2019-01-14 DIAGNOSIS — E559 Vitamin D deficiency, unspecified: Secondary | ICD-10-CM | POA: Diagnosis not present

## 2019-01-14 DIAGNOSIS — N1330 Unspecified hydronephrosis: Secondary | ICD-10-CM | POA: Diagnosis not present

## 2019-01-14 DIAGNOSIS — E1165 Type 2 diabetes mellitus with hyperglycemia: Secondary | ICD-10-CM | POA: Diagnosis not present

## 2019-01-14 DIAGNOSIS — R634 Abnormal weight loss: Secondary | ICD-10-CM | POA: Diagnosis not present

## 2019-01-14 DIAGNOSIS — E1129 Type 2 diabetes mellitus with other diabetic kidney complication: Secondary | ICD-10-CM | POA: Diagnosis not present

## 2019-01-14 DIAGNOSIS — Z794 Long term (current) use of insulin: Secondary | ICD-10-CM | POA: Diagnosis not present

## 2019-01-14 DIAGNOSIS — E7849 Other hyperlipidemia: Secondary | ICD-10-CM | POA: Diagnosis not present

## 2019-01-14 DIAGNOSIS — C20 Malignant neoplasm of rectum: Secondary | ICD-10-CM | POA: Diagnosis not present

## 2019-01-14 DIAGNOSIS — E119 Type 2 diabetes mellitus without complications: Secondary | ICD-10-CM | POA: Diagnosis not present

## 2019-01-14 DIAGNOSIS — N401 Enlarged prostate with lower urinary tract symptoms: Secondary | ICD-10-CM | POA: Diagnosis not present

## 2019-01-14 DIAGNOSIS — R358 Other polyuria: Secondary | ICD-10-CM | POA: Diagnosis not present

## 2019-01-14 DIAGNOSIS — N183 Chronic kidney disease, stage 3 (moderate): Secondary | ICD-10-CM | POA: Diagnosis not present

## 2019-01-14 DIAGNOSIS — E1122 Type 2 diabetes mellitus with diabetic chronic kidney disease: Secondary | ICD-10-CM | POA: Diagnosis not present

## 2019-01-14 DIAGNOSIS — I25810 Atherosclerosis of coronary artery bypass graft(s) without angina pectoris: Secondary | ICD-10-CM | POA: Diagnosis not present

## 2019-01-25 DIAGNOSIS — R338 Other retention of urine: Secondary | ICD-10-CM | POA: Diagnosis not present

## 2019-02-09 DIAGNOSIS — R351 Nocturia: Secondary | ICD-10-CM | POA: Diagnosis not present

## 2019-02-09 DIAGNOSIS — R350 Frequency of micturition: Secondary | ICD-10-CM | POA: Diagnosis not present

## 2019-02-22 DIAGNOSIS — R338 Other retention of urine: Secondary | ICD-10-CM | POA: Diagnosis not present

## 2019-02-25 DIAGNOSIS — N401 Enlarged prostate with lower urinary tract symptoms: Secondary | ICD-10-CM | POA: Diagnosis not present

## 2019-02-25 DIAGNOSIS — E559 Vitamin D deficiency, unspecified: Secondary | ICD-10-CM | POA: Diagnosis not present

## 2019-02-25 DIAGNOSIS — E1129 Type 2 diabetes mellitus with other diabetic kidney complication: Secondary | ICD-10-CM | POA: Diagnosis not present

## 2019-02-25 DIAGNOSIS — E785 Hyperlipidemia, unspecified: Secondary | ICD-10-CM | POA: Diagnosis not present

## 2019-02-25 DIAGNOSIS — E1122 Type 2 diabetes mellitus with diabetic chronic kidney disease: Secondary | ICD-10-CM | POA: Diagnosis not present

## 2019-02-25 DIAGNOSIS — D649 Anemia, unspecified: Secondary | ICD-10-CM | POA: Diagnosis not present

## 2019-02-25 DIAGNOSIS — N183 Chronic kidney disease, stage 3 (moderate): Secondary | ICD-10-CM | POA: Diagnosis not present

## 2019-02-25 DIAGNOSIS — E1142 Type 2 diabetes mellitus with diabetic polyneuropathy: Secondary | ICD-10-CM | POA: Diagnosis not present

## 2019-02-25 DIAGNOSIS — C20 Malignant neoplasm of rectum: Secondary | ICD-10-CM | POA: Diagnosis not present

## 2019-02-25 DIAGNOSIS — I25810 Atherosclerosis of coronary artery bypass graft(s) without angina pectoris: Secondary | ICD-10-CM | POA: Diagnosis not present

## 2019-02-25 DIAGNOSIS — Z794 Long term (current) use of insulin: Secondary | ICD-10-CM | POA: Diagnosis not present

## 2019-03-01 DIAGNOSIS — R310 Gross hematuria: Secondary | ICD-10-CM | POA: Diagnosis not present

## 2019-03-01 DIAGNOSIS — R350 Frequency of micturition: Secondary | ICD-10-CM | POA: Diagnosis not present

## 2019-03-01 DIAGNOSIS — N401 Enlarged prostate with lower urinary tract symptoms: Secondary | ICD-10-CM | POA: Diagnosis not present

## 2019-03-08 DIAGNOSIS — R350 Frequency of micturition: Secondary | ICD-10-CM | POA: Diagnosis not present

## 2019-03-10 DIAGNOSIS — R350 Frequency of micturition: Secondary | ICD-10-CM | POA: Diagnosis not present

## 2019-03-14 ENCOUNTER — Encounter: Payer: Self-pay | Admitting: Internal Medicine

## 2019-03-22 DIAGNOSIS — R350 Frequency of micturition: Secondary | ICD-10-CM | POA: Diagnosis not present

## 2019-03-29 DIAGNOSIS — R350 Frequency of micturition: Secondary | ICD-10-CM | POA: Diagnosis not present

## 2019-04-05 DIAGNOSIS — R350 Frequency of micturition: Secondary | ICD-10-CM | POA: Diagnosis not present

## 2019-04-05 DIAGNOSIS — R351 Nocturia: Secondary | ICD-10-CM | POA: Diagnosis not present

## 2019-04-08 DIAGNOSIS — R310 Gross hematuria: Secondary | ICD-10-CM | POA: Diagnosis not present

## 2019-04-08 DIAGNOSIS — R350 Frequency of micturition: Secondary | ICD-10-CM | POA: Diagnosis not present

## 2019-04-12 DIAGNOSIS — R350 Frequency of micturition: Secondary | ICD-10-CM | POA: Diagnosis not present

## 2019-04-19 DIAGNOSIS — R350 Frequency of micturition: Secondary | ICD-10-CM | POA: Diagnosis not present

## 2019-04-26 DIAGNOSIS — R350 Frequency of micturition: Secondary | ICD-10-CM | POA: Diagnosis not present

## 2019-05-03 DIAGNOSIS — R350 Frequency of micturition: Secondary | ICD-10-CM | POA: Diagnosis not present

## 2019-05-30 DIAGNOSIS — R350 Frequency of micturition: Secondary | ICD-10-CM | POA: Diagnosis not present

## 2019-05-30 DIAGNOSIS — C20 Malignant neoplasm of rectum: Secondary | ICD-10-CM | POA: Diagnosis not present

## 2019-06-06 ENCOUNTER — Other Ambulatory Visit: Payer: Self-pay | Admitting: Surgery

## 2019-06-06 DIAGNOSIS — C20 Malignant neoplasm of rectum: Secondary | ICD-10-CM

## 2019-06-10 ENCOUNTER — Telehealth: Payer: Self-pay | Admitting: Oncology

## 2019-06-10 NOTE — Telephone Encounter (Signed)
Confirmed 12/31 f/u with patient.

## 2019-06-16 ENCOUNTER — Telehealth: Payer: Self-pay | Admitting: Oncology

## 2019-06-16 DIAGNOSIS — E1129 Type 2 diabetes mellitus with other diabetic kidney complication: Secondary | ICD-10-CM | POA: Diagnosis not present

## 2019-06-16 DIAGNOSIS — N1831 Chronic kidney disease, stage 3a: Secondary | ICD-10-CM | POA: Diagnosis not present

## 2019-06-16 DIAGNOSIS — D649 Anemia, unspecified: Secondary | ICD-10-CM | POA: Diagnosis not present

## 2019-06-16 DIAGNOSIS — E1142 Type 2 diabetes mellitus with diabetic polyneuropathy: Secondary | ICD-10-CM | POA: Diagnosis not present

## 2019-06-16 DIAGNOSIS — C20 Malignant neoplasm of rectum: Secondary | ICD-10-CM | POA: Diagnosis not present

## 2019-06-16 DIAGNOSIS — N200 Calculus of kidney: Secondary | ICD-10-CM | POA: Diagnosis not present

## 2019-06-16 DIAGNOSIS — I4891 Unspecified atrial fibrillation: Secondary | ICD-10-CM | POA: Diagnosis not present

## 2019-06-16 DIAGNOSIS — I5189 Other ill-defined heart diseases: Secondary | ICD-10-CM | POA: Diagnosis not present

## 2019-06-16 DIAGNOSIS — I25810 Atherosclerosis of coronary artery bypass graft(s) without angina pectoris: Secondary | ICD-10-CM | POA: Diagnosis not present

## 2019-06-16 NOTE — Telephone Encounter (Signed)
Returned patient's phone call regarding rescheduling an appointment, per patient's request 12/31 appointment has moved to 01/15.

## 2019-07-07 ENCOUNTER — Ambulatory Visit: Payer: Medicare HMO | Admitting: Oncology

## 2019-07-22 ENCOUNTER — Other Ambulatory Visit: Payer: Self-pay

## 2019-07-22 ENCOUNTER — Inpatient Hospital Stay: Payer: Medicare HMO | Attending: Oncology | Admitting: Oncology

## 2019-07-22 ENCOUNTER — Encounter: Payer: Self-pay | Admitting: *Deleted

## 2019-07-22 VITALS — BP 140/69 | HR 83 | Temp 98.9°F | Resp 18 | Ht 69.0 in | Wt 154.1 lb

## 2019-07-22 DIAGNOSIS — I2510 Atherosclerotic heart disease of native coronary artery without angina pectoris: Secondary | ICD-10-CM | POA: Insufficient documentation

## 2019-07-22 DIAGNOSIS — R350 Frequency of micturition: Secondary | ICD-10-CM | POA: Diagnosis not present

## 2019-07-22 DIAGNOSIS — R339 Retention of urine, unspecified: Secondary | ICD-10-CM | POA: Insufficient documentation

## 2019-07-22 DIAGNOSIS — Z951 Presence of aortocoronary bypass graft: Secondary | ICD-10-CM | POA: Diagnosis not present

## 2019-07-22 DIAGNOSIS — E119 Type 2 diabetes mellitus without complications: Secondary | ICD-10-CM | POA: Insufficient documentation

## 2019-07-22 DIAGNOSIS — C20 Malignant neoplasm of rectum: Secondary | ICD-10-CM | POA: Diagnosis not present

## 2019-07-22 DIAGNOSIS — R39198 Other difficulties with micturition: Secondary | ICD-10-CM | POA: Diagnosis not present

## 2019-07-22 NOTE — Progress Notes (Signed)
  [facility] OFFICE PROGRESS NOTE   Diagnosis: Rectal cancer  INTERVAL HISTORY:   Mr. Santillana returns for a scheduled visit.  He feels well.  No difficulty with bowel function.  He continues surveillance proctoscopy with Dr. Johney Maine.  He has urinary frequency, he recently counting going 43 times in a day.  He now wears a diaper and pad at night.  He has seen multiple urologists.  Objective:  Vital signs in last 24 hours:  Blood pressure 140/69, pulse 83, temperature 98.9 F (37.2 C), temperature source Temporal, resp. rate 18, height 5\' 9"  (1.753 m), weight 154 lb 1.6 oz (69.9 kg), SpO2 100 %.    Limited physical examination secondary to distancing with the Covid pandemic Lymphatics: No cervical, supraclavicular, axillary, or inguinal nodes GI: No hepatosplenomegaly, no mass, nontender Vascular: No leg edema   Lab Results:   Lab Results  Component Value Date   CEA1 <1.00 11/26/2017     Medications: I have reviewed the patient's current medications.   Assessment/Plan: 1. Rectal cancer, colonoscopy 11/24/2016 revealed a rectal mass extending to the dentate line, biopsy confirmed invasive adenocarcinoma ? CTs of the chest, abdomen, and pelvis 12/04/2016-negative for metastatic disease, no lymphadenopathy, rectal mass visualized ? EUS 12/18/2016,uT1N0tumor with the distal edge at 0.5 cm from the anal verge ? Transanal excision 01/06/2017-T2, no lymph nodes submitted, lymphovascular invasion noted ? Initiation of radiation/Xeloda 03/02/2017, completed 04/09/2017 2. Descending and sigmoid polyps removed at colonoscopy 11/24/2016-tubular adenomas  Colonoscopy 03/22/2018-diverticulosis, scar and tattoo in the rectum  3. History of coronary artery disease, status post coronary artery bypass surgery  4. Diabetes  5.Urinary frequency, urgency, dysuria with negative urinalysis/culture 03/13/2017  Urinary retention- suprapubic catheter in place,  followed [facility]. Estill Dooms at [facility]    Disposition: Brendan Meyer is in remission from rectal cancer.  He declined a CEA today.  He will return for an office visit and CEA in 6 months.  He continues surveillance of the rectum with Dr. Johney Maine.  He plans to continue follow-up with urology for evaluation and management of urinary frequency.  Betsy Coder, MD  07/22/2019  10:31 AM

## 2019-07-25 ENCOUNTER — Telehealth: Payer: Self-pay | Admitting: Oncology

## 2019-07-25 NOTE — Telephone Encounter (Signed)
Scheduled per los. Called and left msg. Mailed printout  °

## 2019-07-27 DIAGNOSIS — Z794 Long term (current) use of insulin: Secondary | ICD-10-CM | POA: Diagnosis not present

## 2019-07-27 DIAGNOSIS — N401 Enlarged prostate with lower urinary tract symptoms: Secondary | ICD-10-CM | POA: Diagnosis not present

## 2019-07-27 DIAGNOSIS — E1151 Type 2 diabetes mellitus with diabetic peripheral angiopathy without gangrene: Secondary | ICD-10-CM | POA: Diagnosis not present

## 2019-07-27 DIAGNOSIS — N1831 Chronic kidney disease, stage 3a: Secondary | ICD-10-CM | POA: Diagnosis not present

## 2019-08-03 DIAGNOSIS — R350 Frequency of micturition: Secondary | ICD-10-CM | POA: Diagnosis not present

## 2019-08-05 DIAGNOSIS — R8279 Other abnormal findings on microbiological examination of urine: Secondary | ICD-10-CM | POA: Diagnosis not present

## 2019-08-05 DIAGNOSIS — R350 Frequency of micturition: Secondary | ICD-10-CM | POA: Diagnosis not present

## 2019-08-15 DIAGNOSIS — L905 Scar conditions and fibrosis of skin: Secondary | ICD-10-CM | POA: Diagnosis not present

## 2019-08-15 DIAGNOSIS — L821 Other seborrheic keratosis: Secondary | ICD-10-CM | POA: Diagnosis not present

## 2019-08-15 DIAGNOSIS — L570 Actinic keratosis: Secondary | ICD-10-CM | POA: Diagnosis not present

## 2019-09-08 DIAGNOSIS — H401132 Primary open-angle glaucoma, bilateral, moderate stage: Secondary | ICD-10-CM | POA: Diagnosis not present

## 2019-09-08 DIAGNOSIS — H5213 Myopia, bilateral: Secondary | ICD-10-CM | POA: Diagnosis not present

## 2019-09-08 DIAGNOSIS — H2511 Age-related nuclear cataract, right eye: Secondary | ICD-10-CM | POA: Diagnosis not present

## 2019-09-14 DIAGNOSIS — E1151 Type 2 diabetes mellitus with diabetic peripheral angiopathy without gangrene: Secondary | ICD-10-CM | POA: Diagnosis not present

## 2019-09-14 DIAGNOSIS — N1831 Chronic kidney disease, stage 3a: Secondary | ICD-10-CM | POA: Diagnosis not present

## 2019-09-14 DIAGNOSIS — Z794 Long term (current) use of insulin: Secondary | ICD-10-CM | POA: Diagnosis not present

## 2019-09-14 DIAGNOSIS — I2510 Atherosclerotic heart disease of native coronary artery without angina pectoris: Secondary | ICD-10-CM | POA: Diagnosis not present

## 2019-10-12 DIAGNOSIS — H2511 Age-related nuclear cataract, right eye: Secondary | ICD-10-CM | POA: Diagnosis not present

## 2019-10-12 DIAGNOSIS — H401132 Primary open-angle glaucoma, bilateral, moderate stage: Secondary | ICD-10-CM | POA: Diagnosis not present

## 2019-10-13 DIAGNOSIS — R358 Other polyuria: Secondary | ICD-10-CM | POA: Diagnosis not present

## 2019-10-13 DIAGNOSIS — I5189 Other ill-defined heart diseases: Secondary | ICD-10-CM | POA: Diagnosis not present

## 2019-10-13 DIAGNOSIS — N200 Calculus of kidney: Secondary | ICD-10-CM | POA: Diagnosis not present

## 2019-10-13 DIAGNOSIS — Z1331 Encounter for screening for depression: Secondary | ICD-10-CM | POA: Diagnosis not present

## 2019-10-13 DIAGNOSIS — I700 Atherosclerosis of aorta: Secondary | ICD-10-CM | POA: Diagnosis not present

## 2019-10-13 DIAGNOSIS — N1831 Chronic kidney disease, stage 3a: Secondary | ICD-10-CM | POA: Diagnosis not present

## 2019-10-13 DIAGNOSIS — Z794 Long term (current) use of insulin: Secondary | ICD-10-CM | POA: Diagnosis not present

## 2019-10-13 DIAGNOSIS — H409 Unspecified glaucoma: Secondary | ICD-10-CM | POA: Diagnosis not present

## 2019-10-13 DIAGNOSIS — E1149 Type 2 diabetes mellitus with other diabetic neurological complication: Secondary | ICD-10-CM | POA: Diagnosis not present

## 2019-11-10 DIAGNOSIS — C20 Malignant neoplasm of rectum: Secondary | ICD-10-CM | POA: Diagnosis not present

## 2019-11-10 DIAGNOSIS — R350 Frequency of micturition: Secondary | ICD-10-CM | POA: Diagnosis not present

## 2019-11-16 ENCOUNTER — Other Ambulatory Visit: Payer: Self-pay | Admitting: Surgery

## 2019-11-16 DIAGNOSIS — C20 Malignant neoplasm of rectum: Secondary | ICD-10-CM

## 2019-11-24 DIAGNOSIS — F5104 Psychophysiologic insomnia: Secondary | ICD-10-CM | POA: Diagnosis not present

## 2019-11-24 DIAGNOSIS — Z794 Long term (current) use of insulin: Secondary | ICD-10-CM | POA: Diagnosis not present

## 2019-11-24 DIAGNOSIS — N1831 Chronic kidney disease, stage 3a: Secondary | ICD-10-CM | POA: Diagnosis not present

## 2019-11-24 DIAGNOSIS — E7849 Other hyperlipidemia: Secondary | ICD-10-CM | POA: Diagnosis not present

## 2019-11-24 DIAGNOSIS — E1129 Type 2 diabetes mellitus with other diabetic kidney complication: Secondary | ICD-10-CM | POA: Diagnosis not present

## 2019-12-22 ENCOUNTER — Ambulatory Visit
Admission: RE | Admit: 2019-12-22 | Discharge: 2019-12-22 | Disposition: A | Discharge status: Home / Self Care | Payer: Medicare HMO | Source: Ambulatory Visit | Attending: Surgery | Admitting: Surgery

## 2019-12-22 DIAGNOSIS — C218 Malignant neoplasm of overlapping sites of rectum, anus and anal canal: Secondary | ICD-10-CM | POA: Diagnosis not present

## 2019-12-22 DIAGNOSIS — C20 Malignant neoplasm of rectum: Secondary | ICD-10-CM

## 2020-01-10 DIAGNOSIS — R8279 Other abnormal findings on microbiological examination of urine: Secondary | ICD-10-CM | POA: Diagnosis not present

## 2020-01-10 DIAGNOSIS — R310 Gross hematuria: Secondary | ICD-10-CM | POA: Diagnosis not present

## 2020-01-11 DIAGNOSIS — E119 Type 2 diabetes mellitus without complications: Secondary | ICD-10-CM | POA: Diagnosis not present

## 2020-01-20 ENCOUNTER — Inpatient Hospital Stay: Payer: Medicare HMO | Attending: Oncology

## 2020-01-20 ENCOUNTER — Encounter: Payer: Self-pay | Admitting: Nurse Practitioner

## 2020-01-20 ENCOUNTER — Inpatient Hospital Stay (HOSPITAL_BASED_OUTPATIENT_CLINIC_OR_DEPARTMENT_OTHER): Payer: Medicare HMO | Admitting: Nurse Practitioner

## 2020-01-20 ENCOUNTER — Other Ambulatory Visit: Payer: Self-pay

## 2020-01-20 VITALS — BP 159/66 | HR 83 | Temp 98.1°F | Resp 16 | Ht 69.0 in | Wt 148.7 lb

## 2020-01-20 DIAGNOSIS — C20 Malignant neoplasm of rectum: Secondary | ICD-10-CM | POA: Insufficient documentation

## 2020-01-20 DIAGNOSIS — E119 Type 2 diabetes mellitus without complications: Secondary | ICD-10-CM | POA: Diagnosis not present

## 2020-01-20 NOTE — Progress Notes (Addendum)
  [facility] OFFICE PROGRESS NOTE   Diagnosis: Rectal cancer  INTERVAL HISTORY:   Mr. Beckstrand returns as scheduled.  He reports continued frequent urination.  No change in bowel habits.  No blood or pain with bowel movements.  He continues surveillance proctoscopy with Dr. Johney Maine.  He has a good appetite.  He is gaining back some weight he previously lost.  Objective:  Vital signs in last 24 hours:  Blood pressure (!) 159/66, pulse 83, temperature 98.1 F (36.7 C), temperature source Temporal, resp. rate 16, height 5\' 9"  (1.753 m), weight 148 lb 11.2 oz (67.4 kg), SpO2 100 %.    HEENT: Neck without mass. Lymphatics: No palpable cervical, supraclavicular, axillary or inguinal lymph nodes. Resp: Lungs clear bilaterally. Cardio: Regular rate and rhythm. GI: Abdomen soft and nontender.  No hepatomegaly. Vascular: No leg edema.    Lab Results:  Lab Results  Component Value Date   WBC 5.9 06/02/2017   HGB 11.9 (L) 06/02/2017   HCT 36.6 (L) 06/02/2017   MCV 90.8 06/02/2017   PLT 248 06/02/2017   NEUTROABS 4.0 06/02/2017    Imaging:  No results found.  Medications: I have reviewed the patient's current medications.  Assessment/Plan: 1. Rectal cancer, colonoscopy 11/24/2016 revealed a rectal mass extending to the dentate line, biopsy confirmed invasive adenocarcinoma ? CTs of the chest, abdomen, and pelvis 12/04/2016-negative for metastatic disease, no lymphadenopathy, rectal mass visualized ? EUS 12/18/2016,uT1N0tumor with the distal edge at 0.5 cm from the anal verge ? Transanal excision 01/06/2017-T2, no lymph nodes submitted, lymphovascular invasion noted ? Initiation of radiation/Xeloda 03/02/2017, completed 04/09/2017 2. Descending and sigmoid polyps removed at colonoscopy 11/24/2016-tubular adenomas  Colonoscopy 03/22/2018-diverticulosis, scar and tattoo in the rectum  3. History of coronary artery disease, status post coronary artery bypass  surgery  4. Diabetes  5.Urinary frequency, urgency, dysuria with negative urinalysis/culture 03/13/2017  Urinary retention- suprapubic catheter in place, followed by Dr. Suanne Marker [facility]   Disposition: Mr. Dollinger remains in clinical remission from rectal cancer.  We will follow-up on the CEA from today.  He will return for a CEA and follow-up visit in 6 months.  Patient seen with Dr. Benay Spice.    Eissa Buchberger ANP/GNP-BC   01/20/2020  2:46 PM  This was a shared visit with Ned Card.  Mr. Colello is in remission from rectal cancer.  He continues rectal surveillance with Dr. Johney Maine.  He will continue surveillance colonoscopy with Dr. Henrene Pastor.  Julieanne Manson, MD

## 2020-01-23 ENCOUNTER — Telehealth: Payer: Self-pay

## 2020-01-23 ENCOUNTER — Telehealth: Payer: Self-pay | Admitting: Nurse Practitioner

## 2020-01-23 LAB — CEA (IN HOUSE-CHCC): CEA (CHCC-In House): <1 ng/mL (ref 0.00–5.00)

## 2020-01-23 NOTE — Telephone Encounter (Signed)
Message left relaying labs and to call back for any questions

## 2020-01-23 NOTE — Telephone Encounter (Signed)
Scheduled per 7/16 los. Called and left a msg, mailing appt letter and calendar printout  

## 2020-01-23 NOTE — Telephone Encounter (Signed)
-----   Message from Owens Shark, NP sent at 01/23/2020  3:47 PM EDT ----- Please let him know CEA is stable in the normal range.  Follow-up as scheduled.

## 2020-01-24 ENCOUNTER — Telehealth: Payer: Self-pay

## 2020-01-24 NOTE — Telephone Encounter (Signed)
2nd call placed message left relayed lab results

## 2020-01-24 NOTE — Telephone Encounter (Signed)
-----   Message from Owens Shark, NP sent at 01/23/2020  3:47 PM EDT ----- Please let him know CEA is stable in the normal range.  Follow-up as scheduled.

## 2020-02-14 DIAGNOSIS — C20 Malignant neoplasm of rectum: Secondary | ICD-10-CM | POA: Diagnosis not present

## 2020-02-14 DIAGNOSIS — N1330 Unspecified hydronephrosis: Secondary | ICD-10-CM | POA: Diagnosis not present

## 2020-02-14 DIAGNOSIS — N1831 Chronic kidney disease, stage 3a: Secondary | ICD-10-CM | POA: Diagnosis not present

## 2020-02-14 DIAGNOSIS — N401 Enlarged prostate with lower urinary tract symptoms: Secondary | ICD-10-CM | POA: Diagnosis not present

## 2020-02-14 DIAGNOSIS — E1142 Type 2 diabetes mellitus with diabetic polyneuropathy: Secondary | ICD-10-CM | POA: Diagnosis not present

## 2020-02-14 DIAGNOSIS — I4891 Unspecified atrial fibrillation: Secondary | ICD-10-CM | POA: Diagnosis not present

## 2020-02-14 DIAGNOSIS — E785 Hyperlipidemia, unspecified: Secondary | ICD-10-CM | POA: Diagnosis not present

## 2020-02-14 DIAGNOSIS — R251 Tremor, unspecified: Secondary | ICD-10-CM | POA: Diagnosis not present

## 2020-02-14 DIAGNOSIS — E559 Vitamin D deficiency, unspecified: Secondary | ICD-10-CM | POA: Diagnosis not present

## 2020-02-16 DIAGNOSIS — N401 Enlarged prostate with lower urinary tract symptoms: Secondary | ICD-10-CM | POA: Diagnosis not present

## 2020-02-16 DIAGNOSIS — R350 Frequency of micturition: Secondary | ICD-10-CM | POA: Diagnosis not present

## 2020-03-08 DIAGNOSIS — R300 Dysuria: Secondary | ICD-10-CM | POA: Diagnosis not present

## 2020-03-08 DIAGNOSIS — N401 Enlarged prostate with lower urinary tract symptoms: Secondary | ICD-10-CM | POA: Diagnosis not present

## 2020-03-08 DIAGNOSIS — R350 Frequency of micturition: Secondary | ICD-10-CM | POA: Diagnosis not present

## 2020-04-10 DIAGNOSIS — E119 Type 2 diabetes mellitus without complications: Secondary | ICD-10-CM | POA: Diagnosis not present

## 2020-05-23 ENCOUNTER — Other Ambulatory Visit: Payer: Self-pay | Admitting: Surgery

## 2020-05-23 DIAGNOSIS — C20 Malignant neoplasm of rectum: Secondary | ICD-10-CM

## 2020-05-25 DIAGNOSIS — H401132 Primary open-angle glaucoma, bilateral, moderate stage: Secondary | ICD-10-CM | POA: Diagnosis not present

## 2020-05-25 DIAGNOSIS — H2511 Age-related nuclear cataract, right eye: Secondary | ICD-10-CM | POA: Diagnosis not present

## 2020-06-04 ENCOUNTER — Ambulatory Visit
Admission: RE | Admit: 2020-06-04 | Discharge: 2020-06-04 | Disposition: A | Discharge status: Home / Self Care | Payer: Medicare HMO | Source: Ambulatory Visit | Attending: Surgery | Admitting: Surgery

## 2020-06-04 ENCOUNTER — Other Ambulatory Visit: Payer: Self-pay | Admitting: Surgery

## 2020-06-04 DIAGNOSIS — C20 Malignant neoplasm of rectum: Secondary | ICD-10-CM

## 2020-06-04 DIAGNOSIS — K6289 Other specified diseases of anus and rectum: Secondary | ICD-10-CM | POA: Diagnosis not present

## 2020-06-04 DIAGNOSIS — K6389 Other specified diseases of intestine: Secondary | ICD-10-CM | POA: Diagnosis not present

## 2020-06-04 DIAGNOSIS — K5730 Diverticulosis of large intestine without perforation or abscess without bleeding: Secondary | ICD-10-CM | POA: Diagnosis not present

## 2020-06-18 DIAGNOSIS — E1140 Type 2 diabetes mellitus with diabetic neuropathy, unspecified: Secondary | ICD-10-CM | POA: Diagnosis not present

## 2020-06-18 DIAGNOSIS — I2510 Atherosclerotic heart disease of native coronary artery without angina pectoris: Secondary | ICD-10-CM | POA: Diagnosis not present

## 2020-06-18 DIAGNOSIS — I4891 Unspecified atrial fibrillation: Secondary | ICD-10-CM | POA: Diagnosis not present

## 2020-06-18 DIAGNOSIS — E785 Hyperlipidemia, unspecified: Secondary | ICD-10-CM | POA: Diagnosis not present

## 2020-06-18 DIAGNOSIS — E559 Vitamin D deficiency, unspecified: Secondary | ICD-10-CM | POA: Diagnosis not present

## 2020-06-18 DIAGNOSIS — F418 Other specified anxiety disorders: Secondary | ICD-10-CM | POA: Diagnosis not present

## 2020-06-18 DIAGNOSIS — N1831 Chronic kidney disease, stage 3a: Secondary | ICD-10-CM | POA: Diagnosis not present

## 2020-06-18 DIAGNOSIS — C20 Malignant neoplasm of rectum: Secondary | ICD-10-CM | POA: Diagnosis not present

## 2020-06-18 DIAGNOSIS — I700 Atherosclerosis of aorta: Secondary | ICD-10-CM | POA: Diagnosis not present

## 2020-06-26 DIAGNOSIS — R8279 Other abnormal findings on microbiological examination of urine: Secondary | ICD-10-CM | POA: Diagnosis not present

## 2020-06-26 DIAGNOSIS — N130 Hydronephrosis with ureteropelvic junction obstruction: Secondary | ICD-10-CM | POA: Diagnosis not present

## 2020-06-26 DIAGNOSIS — R350 Frequency of micturition: Secondary | ICD-10-CM | POA: Diagnosis not present

## 2020-07-05 DIAGNOSIS — E119 Type 2 diabetes mellitus without complications: Secondary | ICD-10-CM | POA: Diagnosis not present

## 2020-07-09 DIAGNOSIS — N130 Hydronephrosis with ureteropelvic junction obstruction: Secondary | ICD-10-CM | POA: Diagnosis not present

## 2020-07-11 DIAGNOSIS — R350 Frequency of micturition: Secondary | ICD-10-CM | POA: Diagnosis not present

## 2020-07-11 DIAGNOSIS — C20 Malignant neoplasm of rectum: Secondary | ICD-10-CM | POA: Diagnosis not present

## 2020-07-16 ENCOUNTER — Other Ambulatory Visit: Payer: Self-pay | Admitting: Urology

## 2020-07-17 ENCOUNTER — Encounter (HOSPITAL_COMMUNITY): Payer: Self-pay | Admitting: Urology

## 2020-07-17 ENCOUNTER — Other Ambulatory Visit: Payer: Self-pay

## 2020-07-17 NOTE — Progress Notes (Signed)
Patient has no current cardiologist lov dr berry 01-02-2017 epic Epic, echo 01-02-2017 epic, exercise tolerance test 01-02-2017 epic, patient states he sees pcp dr Reynold Bowen for all medical needs.  Left 4th ringer finger ring stuck, patient will try to remove.

## 2020-07-18 ENCOUNTER — Other Ambulatory Visit (HOSPITAL_COMMUNITY)
Admission: RE | Admit: 2020-07-18 | Discharge: 2020-07-18 | Disposition: A | Discharge status: Home / Self Care | Payer: Medicare HMO | Source: Ambulatory Visit | Attending: Urology | Admitting: Urology

## 2020-07-18 ENCOUNTER — Other Ambulatory Visit (HOSPITAL_COMMUNITY): Payer: Medicare HMO

## 2020-07-18 DIAGNOSIS — Z20822 Contact with and (suspected) exposure to covid-19: Secondary | ICD-10-CM | POA: Insufficient documentation

## 2020-07-18 DIAGNOSIS — Z01812 Encounter for preprocedural laboratory examination: Secondary | ICD-10-CM | POA: Insufficient documentation

## 2020-07-18 LAB — SARS CORONAVIRUS 2 (TAT 6-24 HRS): SARS Coronavirus 2: NEGATIVE

## 2020-07-19 ENCOUNTER — Telehealth: Payer: Self-pay | Admitting: *Deleted

## 2020-07-19 NOTE — Telephone Encounter (Signed)
Called to report he wants to cancel his OV with Dr. Benay Spice on 07/23/20--has a lot going on right now with a procedure tomorrow. He will call when ready to reschedule.

## 2020-07-20 ENCOUNTER — Ambulatory Visit (HOSPITAL_COMMUNITY)
Admission: RE | Admit: 2020-07-20 | Discharge: 2020-07-20 | Disposition: A | Discharge status: Home / Self Care | Payer: Medicare HMO | Attending: Urology | Admitting: Urology

## 2020-07-20 ENCOUNTER — Ambulatory Visit (HOSPITAL_COMMUNITY): Payer: Medicare HMO | Admitting: Anesthesiology

## 2020-07-20 ENCOUNTER — Encounter (HOSPITAL_COMMUNITY)
Admission: RE | Disposition: A | Discharge status: Home / Self Care | Payer: Self-pay | Source: Home / Self Care | Attending: Urology

## 2020-07-20 ENCOUNTER — Encounter (HOSPITAL_COMMUNITY): Payer: Self-pay | Admitting: Urology

## 2020-07-20 ENCOUNTER — Ambulatory Visit (HOSPITAL_COMMUNITY): Payer: Medicare HMO

## 2020-07-20 DIAGNOSIS — E1122 Type 2 diabetes mellitus with diabetic chronic kidney disease: Secondary | ICD-10-CM | POA: Diagnosis not present

## 2020-07-20 DIAGNOSIS — N1330 Unspecified hydronephrosis: Secondary | ICD-10-CM | POA: Insufficient documentation

## 2020-07-20 DIAGNOSIS — Z85048 Personal history of other malignant neoplasm of rectum, rectosigmoid junction, and anus: Secondary | ICD-10-CM | POA: Insufficient documentation

## 2020-07-20 DIAGNOSIS — Z8249 Family history of ischemic heart disease and other diseases of the circulatory system: Secondary | ICD-10-CM | POA: Insufficient documentation

## 2020-07-20 DIAGNOSIS — Z923 Personal history of irradiation: Secondary | ICD-10-CM | POA: Insufficient documentation

## 2020-07-20 DIAGNOSIS — N189 Chronic kidney disease, unspecified: Secondary | ICD-10-CM | POA: Diagnosis not present

## 2020-07-20 DIAGNOSIS — N179 Acute kidney failure, unspecified: Secondary | ICD-10-CM

## 2020-07-20 DIAGNOSIS — Z794 Long term (current) use of insulin: Secondary | ICD-10-CM | POA: Diagnosis not present

## 2020-07-20 DIAGNOSIS — Z951 Presence of aortocoronary bypass graft: Secondary | ICD-10-CM | POA: Diagnosis not present

## 2020-07-20 DIAGNOSIS — Z823 Family history of stroke: Secondary | ICD-10-CM | POA: Diagnosis not present

## 2020-07-20 DIAGNOSIS — N2882 Megaloureter: Secondary | ICD-10-CM | POA: Insufficient documentation

## 2020-07-20 DIAGNOSIS — Z7984 Long term (current) use of oral hypoglycemic drugs: Secondary | ICD-10-CM | POA: Diagnosis not present

## 2020-07-20 DIAGNOSIS — N3941 Urge incontinence: Secondary | ICD-10-CM | POA: Insufficient documentation

## 2020-07-20 DIAGNOSIS — I129 Hypertensive chronic kidney disease with stage 1 through stage 4 chronic kidney disease, or unspecified chronic kidney disease: Secondary | ICD-10-CM | POA: Insufficient documentation

## 2020-07-20 DIAGNOSIS — E119 Type 2 diabetes mellitus without complications: Secondary | ICD-10-CM | POA: Diagnosis not present

## 2020-07-20 HISTORY — PX: BOTOX INJECTION: SHX5754

## 2020-07-20 HISTORY — PX: CYSTOSCOPY W/ RETROGRADES: SHX1426

## 2020-07-20 HISTORY — DX: Nocturia: R35.1

## 2020-07-20 LAB — GLUCOSE, CAPILLARY
Glucose-Capillary: 189 mg/dL — ABNORMAL HIGH (ref 70–99)
Glucose-Capillary: 185 mg/dL — ABNORMAL HIGH (ref 70–99)

## 2020-07-20 LAB — BASIC METABOLIC PANEL
Anion gap: 8 (ref 5–15)
BUN: 32 mg/dL — ABNORMAL HIGH (ref 8–23)
CO2: 26 mmol/L (ref 22–32)
Calcium: 9.4 mg/dL (ref 8.9–10.3)
Chloride: 106 mmol/L (ref 98–111)
Creatinine, Ser: 2.09 mg/dL — ABNORMAL HIGH (ref 0.61–1.24)
GFR, Estimated: 31 mL/min — ABNORMAL LOW (ref >60)
Glucose, Bld: 182 mg/dL — ABNORMAL HIGH (ref 70–99)
Potassium: 4.1 mmol/L (ref 3.5–5.1)
Sodium: 140 mmol/L (ref 135–145)

## 2020-07-20 LAB — CBC
HCT: 39.3 % (ref 39.0–52.0)
Hemoglobin: 13 g/dL (ref 13.0–17.0)
MCH: 29.3 pg (ref 26.0–34.0)
MCHC: 33.1 g/dL (ref 30.0–36.0)
MCV: 88.5 fL (ref 80.0–100.0)
Platelets: 213 10*3/uL (ref 150–400)
RBC: 4.44 MIL/uL (ref 4.22–5.81)
RDW: 13.5 % (ref 11.5–15.5)
WBC: 5.8 10*3/uL (ref 4.0–10.5)
nRBC: 0 % (ref 0.0–0.2)

## 2020-07-20 SURGERY — CYSTOSCOPY, WITH RETROGRADE PYELOGRAM
Anesthesia: General

## 2020-07-20 MED ORDER — SODIUM CHLORIDE (PF) 0.9 % IJ SOLN
INTRAMUSCULAR | Status: AC
  Filled 2020-07-20: qty 30

## 2020-07-20 MED ORDER — EPHEDRINE SULFATE-NACL 50-0.9 MG/10ML-% IV SOSY
PREFILLED_SYRINGE | INTRAVENOUS | Status: DC | PRN
  Administered 2020-07-20 (×2): 10 mg via INTRAVENOUS

## 2020-07-20 MED ORDER — CHLORHEXIDINE GLUCONATE 0.12 % MT SOLN
15.0000 mL | Freq: Once | OROMUCOSAL | Status: AC
  Administered 2020-07-20: 15 mL via OROMUCOSAL

## 2020-07-20 MED ORDER — STERILE WATER FOR IRRIGATION IR SOLN
Status: DC | PRN
  Administered 2020-07-20: 500 mL

## 2020-07-20 MED ORDER — ONABOTULINUMTOXINA 100 UNITS IJ SOLR
100.0000 [IU] | Freq: Once | INTRAMUSCULAR | Status: AC
  Administered 2020-07-20: 150 [IU] via INTRAMUSCULAR
  Filled 2020-07-20: qty 100

## 2020-07-20 MED ORDER — 0.9 % SODIUM CHLORIDE (POUR BTL) OPTIME
TOPICAL | Status: DC | PRN
  Administered 2020-07-20: 1000 mL

## 2020-07-20 MED ORDER — ATROPINE SULFATE 0.4 MG/ML IV SOSY
PREFILLED_SYRINGE | INTRAVENOUS | Status: DC | PRN
  Administered 2020-07-20: .4 mg via INTRAVENOUS

## 2020-07-20 MED ORDER — AMOXICILLIN-POT CLAVULANATE 500-125 MG PO TABS: 1.0000 | ORAL_TABLET | Freq: Two times a day (BID) | ORAL | 0 refills | Status: DC

## 2020-07-20 MED ORDER — FENTANYL CITRATE (PF) 100 MCG/2ML IJ SOLN
INTRAMUSCULAR | Status: DC | PRN
  Administered 2020-07-20 (×4): 25 ug via INTRAVENOUS

## 2020-07-20 MED ORDER — ACETAMINOPHEN 10 MG/ML IV SOLN: 1000.0000 mg | Freq: Once | INTRAVENOUS | Status: DC | PRN

## 2020-07-20 MED ORDER — CEFAZOLIN SODIUM-DEXTROSE 2-4 GM/100ML-% IV SOLN
2.0000 g | INTRAVENOUS | Status: AC
  Administered 2020-07-20: 2 g via INTRAVENOUS
  Filled 2020-07-20: qty 100

## 2020-07-20 MED ORDER — METOPROLOL TARTRATE 5 MG/5ML IV SOLN
INTRAVENOUS | Status: DC | PRN
  Administered 2020-07-20: 2 mg via INTRAVENOUS

## 2020-07-20 MED ORDER — EPHEDRINE 5 MG/ML INJ
INTRAVENOUS | Status: AC
  Filled 2020-07-20: qty 10

## 2020-07-20 MED ORDER — ONDANSETRON HCL 4 MG/2ML IJ SOLN: 4.0000 mg | Freq: Once | INTRAMUSCULAR | Status: DC | PRN

## 2020-07-20 MED ORDER — ONABOTULINUMTOXINA 100 UNITS IJ SOLR
INTRAMUSCULAR | Status: AC
  Filled 2020-07-20: qty 200

## 2020-07-20 MED ORDER — HYDROMORPHONE HCL 1 MG/ML IJ SOLN: 0.2500 mg | INTRAMUSCULAR | Status: DC | PRN

## 2020-07-20 MED ORDER — SODIUM CHLORIDE 0.9 % IR SOLN
Status: DC | PRN
  Administered 2020-07-20 (×2): 1000 mL

## 2020-07-20 MED ORDER — PROPOFOL 10 MG/ML IV BOLUS
INTRAVENOUS | Status: DC | PRN
  Administered 2020-07-20: 130 mg via INTRAVENOUS

## 2020-07-20 MED ORDER — GENTAMICIN SULFATE 40 MG/ML IJ SOLN
2.0000 mg/kg | Freq: Once | INTRAVENOUS | Status: AC
  Administered 2020-07-20: 140 mg via INTRAVENOUS
  Filled 2020-07-20: qty 3.5

## 2020-07-20 MED ORDER — ORAL CARE MOUTH RINSE: 15.0000 mL | Freq: Once | OROMUCOSAL | Status: AC

## 2020-07-20 MED ORDER — METOPROLOL TARTRATE 5 MG/5ML IV SOLN
INTRAVENOUS | Status: AC
  Filled 2020-07-20: qty 5

## 2020-07-20 MED ORDER — ONDANSETRON HCL 4 MG/2ML IJ SOLN
INTRAMUSCULAR | Status: DC | PRN
  Administered 2020-07-20: 4 mg via INTRAVENOUS

## 2020-07-20 MED ORDER — IOHEXOL 300 MG/ML  SOLN
INTRAMUSCULAR | Status: DC | PRN
  Administered 2020-07-20: 150 mL

## 2020-07-20 MED ORDER — LIDOCAINE HCL (PF) 2 % IJ SOLN
INTRAMUSCULAR | Status: AC
  Filled 2020-07-20: qty 5

## 2020-07-20 MED ORDER — PROPOFOL 10 MG/ML IV BOLUS
INTRAVENOUS | Status: AC
  Filled 2020-07-20: qty 20

## 2020-07-20 MED ORDER — ONDANSETRON HCL 4 MG/2ML IJ SOLN
INTRAMUSCULAR | Status: AC
  Filled 2020-07-20: qty 2

## 2020-07-20 MED ORDER — LIDOCAINE 2% (20 MG/ML) 5 ML SYRINGE
INTRAMUSCULAR | Status: DC | PRN
  Administered 2020-07-20: 60 mg via INTRAVENOUS

## 2020-07-20 MED ORDER — LACTATED RINGERS IV SOLN: INTRAVENOUS | Status: DC

## 2020-07-20 MED ORDER — FENTANYL CITRATE (PF) 100 MCG/2ML IJ SOLN
INTRAMUSCULAR | Status: AC
  Filled 2020-07-20: qty 2

## 2020-07-20 SURGICAL SUPPLY — 19 items
BAG URO CATCHER STRL LF (MISCELLANEOUS) ×3 IMPLANT
CATH URET 5FR 28IN OPEN ENDED (CATHETERS) ×3 IMPLANT
CLOTH BEACON ORANGE TIMEOUT ST (SAFETY) ×3 IMPLANT
COVER WAND RF STERILE (DRAPES) IMPLANT
GLOVE BIO SURGEON STRL SZ7.5 (GLOVE) ×3 IMPLANT
GOWN STRL REUS W/TWL LRG LVL3 (GOWN DISPOSABLE) ×3 IMPLANT
GUIDEWIRE STR DUAL SENSOR (WIRE) ×3 IMPLANT
KIT TURNOVER KIT A (KITS) IMPLANT
LASER FIB FLEXIVA PULSE ID 365 (Laser) IMPLANT
LASER FIB FLEXIVA PULSE ID 550 (Laser) IMPLANT
LASER FIB FLEXIVA PULSE ID 910 (Laser) IMPLANT
MANIFOLD NEPTUNE II (INSTRUMENTS) ×3 IMPLANT
NEEDLE ASPIRATION 22 (NEEDLE) ×3 IMPLANT
NS IRRIG 1000ML POUR BTL (IV SOLUTION) ×3 IMPLANT
PACK CYSTO (CUSTOM PROCEDURE TRAY) ×3 IMPLANT
PENCIL SMOKE EVACUATOR (MISCELLANEOUS) IMPLANT
TRACTIP FLEXIVA PULS ID 200XHI (Laser) IMPLANT
TRACTIP FLEXIVA PULSE ID 200 (Laser)
TUBING CONNECTING 10 (TUBING) ×3 IMPLANT

## 2020-07-20 NOTE — Anesthesia Preprocedure Evaluation (Addendum)
Anesthesia Evaluation  Patient identified by MRN, date of birth, ID band Patient awake    Reviewed: NPO status , Patient's Chart, lab work & pertinent test results  Airway Mallampati: II  TM Distance: >3 FB Neck ROM: Full    Dental  (+) Upper Dentures, Lower Dentures   Pulmonary neg pulmonary ROS,    Pulmonary exam normal        Cardiovascular hypertension, Pt. on medications and Pt. on home beta blockers + CAD and + CABG   Rhythm:Regular Rate:Normal     Neuro/Psych negative neurological ROS  negative psych ROS   GI/Hepatic Neg liver ROS, GERD  ,Rectal cancer   Endo/Other  diabetes, Well Controlled, Type 2, Insulin Dependent, Oral Hypoglycemic Agents  Renal/GU ARFRenal diseaseHydronephrosis with incontinence    BPH    Musculoskeletal negative musculoskeletal ROS (+)   Abdominal (+)  Abdomen: soft. Bowel sounds: normal.  Peds  Hematology negative hematology ROS (+)   Anesthesia Other Findings   Reproductive/Obstetrics                           Anesthesia Physical Anesthesia Plan  ASA: III  Anesthesia Plan: General   Post-op Pain Management:    Induction: Intravenous  PONV Risk Score and Plan: 2 and Ondansetron, Treatment may vary due to age or medical condition and Dexamethasone  Airway Management Planned: Mask and LMA  Additional Equipment: None  Intra-op Plan:   Post-operative Plan: Extubation in OR  Informed Consent: I have reviewed the patients History and Physical, chart, labs and discussed the procedure including the risks, benefits and alternatives for the proposed anesthesia with the patient or authorized representative who has indicated his/her understanding and acceptance.     Dental advisory given  Plan Discussed with: CRNA  Anesthesia Plan Comments: (Covid-19 Nucleic Acid Test Results Lab Results      Component                Value               Date                       [place]              NEGATIVE            07/18/2020              ECHO 2018: Study Conclusions  - Left ventricle: The cavity size was normal. Systolic function was  normal. The estimated ejection fraction was in the range of 50%  to 55%. There is hypokinesis of the mid-apical septal myocardium.  Doppler parameters are consistent with abnormal left ventricular  relaxation (grade 1 diastolic dysfunction).  - Aortic valve: There was mild regurgitation.  - Ascending aorta: The ascending aorta was mildly dilated.  - Mitral valve: Calcified annulus.)        Anesthesia Quick Evaluation

## 2020-07-20 NOTE — Transfer of Care (Signed)
Immediate Anesthesia Transfer of Care Note  Patient: Brendan Meyer  Procedure(s) Performed: CYSTOSCOPY WITH RETROGRADE PYELOGRAM DIAGNOSTIC URETEROSCOPY (Bilateral ) BOTOX INJECTION (N/A )  Patient Location: PACU  Anesthesia Type:General  Level of Consciousness: sedated  Airway & Oxygen Therapy: Patient Spontanous Breathing and Patient connected to face mask oxygen  Post-op Assessment: Report given to RN and Post -op Vital signs reviewed and stable  Post vital signs: Reviewed and stable  Last Vitals:  Vitals Value Taken Time  BP 142/77 07/20/20 1138  Temp    Pulse 95 07/20/20 1138  Resp 17 07/20/20 1138  SpO2 100 % 07/20/20 1138  Vitals shown include unvalidated device data.  Last Pain:  Vitals:   07/20/20 0842  TempSrc:   PainSc: 0-No pain         Complications: No complications documented.

## 2020-07-20 NOTE — Anesthesia Postprocedure Evaluation (Signed)
Anesthesia Post Note  Patient: Brendan Meyer  Procedure(s) Performed: CYSTOSCOPY WITH RETROGRADE PYELOGRAM DIAGNOSTIC URETEROSCOPY (Bilateral ) BOTOX INJECTION (N/A )     Patient location during evaluation: PACU Anesthesia Type: General Level of consciousness: awake and alert Pain management: pain level controlled Vital Signs Assessment: post-procedure vital signs reviewed and stable Respiratory status: spontaneous breathing, nonlabored ventilation, respiratory function stable and patient connected to nasal cannula oxygen Cardiovascular status: blood pressure returned to baseline and stable Postop Assessment: no apparent nausea or vomiting Anesthetic complications: no   No complications documented.  Last Vitals:  Vitals:   07/20/20 1245 07/20/20 1304  BP: (!) 147/69 (!) 150/80  Pulse: 79   Resp: 13 12  Temp: 36.7 C 36.4 C  SpO2: 100% 100%    Last Pain:  Vitals:   07/20/20 1304  TempSrc: Axillary  PainSc: 0-No pain                 Belenda Cruise P Cahterine Heinzel

## 2020-07-20 NOTE — Op Note (Signed)
Preoperative diagnosis:  1. Right hydronephrosis - chronic renal failure 2. Urinary frequency/urge incontinence    Postoperative diagnosis:  1. Same  Procedure: 1. Cystogram 2. Cystoscopy, right retrograde pyelogram with interpretation 3. Right ureteroscopy, diagnostic 4. Intravesical Botox injection  Surgeon: Ardis Hughs, MD  Anesthesia: General  Complications: None  Intraoperative findings:  #1: The patient's cystogram demonstrated a very contracted poorly compliant bladder with immediate reflux to the patient's right kidney.  The total or maximum capacity of the patient's bladder was 100 mL.  This is discrete and demonstrated large cellules throughout the trigone and lateral aspects of the bladder. #2: Cystoscopy demonstrated similar findings as above.  The right ureter was completely blown out of the orifice.  The left ureter was unidentifiable given the abnormal architecture of the bladder.  I was unable to find the left ureter at all. #3: The right ureter was widely patent and slightly torturous.  The renal calyces were blunted.  There was moderate hydroureteronephrosis. #4: The bladder was very friable and the mucosa was easily torn leading to substantial bleeding around the bladder neck and trigone region. #5: 150 units of Botox was injected into and around the trigonal region of the patient's bladder.  EBL: Minimal  Specimens: None  Indication: Brendan Meyer is a 86 y.o. patient with severe irritative lower urinary tract symptoms with frequency, urgency, and incontinence.  He also has worsening renal function and right hydroureteronephrosis..  After reviewing the management options for treatment, he elected to proceed with the above surgical procedure(s). We have discussed the potential benefits and risks of the procedure, side effects of the proposed treatment, the likelihood of the patient achieving the goals of the procedure, and any potential problems that  might occur during the procedure or recuperation. Informed consent has been obtained.  Description of procedure:  The patient was taken to the operating room and general anesthesia was induced.  The patient was placed in the dorsal lithotomy position, prepped and draped in the usual sterile fashion, and preoperative antibiotics were administered. A preoperative time-out was performed.   21 French 30 degree cystoscope was gently passed to the patient's bladder into the urethra under visual guidance.  Cystoscopy was performed with the above findings.  After cystoscopy was performed there was substantial bleeding at the bladder neck making it difficult to visualize the patient's left ureteral orifice.  After searching for about 15 minutes for this unsuccessfully I proceeded to cannulate the patient's right ureteral orifice which was a blown out golf-hole ureter.  I advanced a 5 [place] open-ended ureteral catheter and performed retrograde pyelogram with above findings.  I then advanced a wire up through the catheter and into the right renal pelvis.  I exchanged the cystoscope for the diagnostic flexible ureteroscope and it was able to get this up into the right renal pelvis successfully.  The mucosa was all normal in appearance.  There was some sediment in the renal pelvis which I irrigated out.  I slowly pulled back through the collecting system and again noted no significant abnormality aside from significant dilation.  I then exchanged the ureteroscope for a 0 degree cystoscope and using 150 units of Botox and 15 mL of saline I injected 1 mL in 15 different locations along the trigone of the patient's bladder.  I then remove the scope and opted to place a Foley catheter given the significant amount of bleeding.  The patient was subsequently extubated and returned to the PACU in stable condition.  Ardis Hughs, M.D.

## 2020-07-20 NOTE — H&P (Signed)
86 year old male who is seen today for urgent evaluation due to worsening renal function. The patient was seen by his primary care provider last week and his creatinine was noted to be elevated at 2.2. I do not have any additional lab work to compare this to.   I have seen this patient before, I initially met him in 2015. At that time he was noted to have hydronephrosis. 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 He was lost to follow-up. He has subsequently been seen by Dr. Estill Dooms in [facility]. In January 2020 the patient had a Lasix renogram and demonstrated no obstruction of the left kidney. He then underwent a TURP. His voiding symptoms have worsened, he struggles now with urinary frequency and urgency. He has failed many different overactive bladder medications as well as posterior tibial nerve stimulation. He has been seen by Dr. board in an by Dr. Matilde Sprang for his voiding symptoms.   The patient has a past medical history significant for rectal cancer. This was diagnosed in treated over the past several years. He had the primary resection and then followed by radiation. Since the radiation the patient's urinary tract symptoms have been quite severe.   Looking in epic, on March 2020 the patient's BUN/creatinine were 27/1.28     ALLERGIES: No Allergies    MEDICATIONS: Aspirin Ec 81 mg tablet, delayed release Oral  Lantus Solostar 100 unit/ml (3 ml) insulin pen Subcutaneous  Metformin Hcl Er 500 mg tablet, extended release 24 hr Oral  Metoprolol Tartrate 25 mg tablet Oral  Omeprazole 20 mg capsule,delayed release Oral  Simvastatin 40 mg tablet Oral  Zolpidem Tartrate     GU PSH: Complex cystometrogram, w/ void pressure and urethral pressure profile studies, any technique - 2020 Complex Uroflow - 2020 Cystoscopy - 2020 Cystoscopy Ureteroscopy - 2015 Emg surf Electrd - 2020 Inject For cystogram  - 2020 Intrabd voidng Press - 2020 Locm 300-$RemoveBefor'399Mg'[id number]$ /Ml Iodine,1Ml - 2020       PSH Notes: Cystoscopy With Ureteroscopy Left, Hernia Repair, CABG (CABG), colon tumor removed (2018), heart bypass (2010)   NON-GU PSH: CABG (coronary artery bypass grafting) - 2015 Hernia Repair - 2015 Neuroeltrd Stim Post Tibial - 05/03/2019, 04/26/2019, 04/19/2019, 04/12/2019, 04/05/2019, 03/29/2019, 03/22/2019, 03/10/2019, 03/08/2019, 03/01/2019     GU PMH: Dysuria - 03/08/2020 Nocturia (Stable) - 02/09/2019, - 2020 Urinary Frequency (Stable) - 02/09/2019, - 2020 Urinary Retention - 01/04/2019 BPH w/LUTS - 2020 Gross hematuria - 2020 Hydronephrosis - 2020 Urinary Retention, Unspec - 2018, - 2018, Urinary retention, - 2015 Hydronephrosis Unspec, Hydronephrosis, left - 2015 Renal calculus, Nephrolithiasis - 2015      PMH Notes: h/o colon cancer   1) Hematuria: He presented to me in April 2020 and had recent gross hematuria. He has a history of pelvic radiation due to colon cancer. He denied tobacco use.   May 2020: CT imaging - Left hydronephrosis with urothelial thickening of proximal left ureter (reviewed Dr. Lorinda Creed operative note from January 2020 indicating retrograde pyelogram that was normal without concerning urothelial lesions noted), Cystoscopy - His prostatic urethra was short measuring approximately 2-2.5 cm. There is poor visualization and his bladder is the urine was quite cloudy. No definite tumors were identified.   2) LUTS: He has severe LUTS that developed after radiation to the pelvis for colon cancer. He developed retention after his radiation and had an indwelling catheter for about a year. He then underwent a  TURP by Dr. Lindley Magnus. He has had severe LUTS including frequency and urgency since.   Current treatment: Indwelling catheter  Prior treatment: Myrbetriq (no effect), Toviaz (ineffective), Trospium (ineffective)   May-Jun 2020: Trial of voriconazole for funguria (no effect on LUTS), offered  longer course and he refused due to cost and       NON-GU PMH: Pyuria/other UA findings - 2020 Colon Cancer, History Diabetes Type 2    FAMILY HISTORY: 1 Daughter - Daughter 1 son - Son cerebrovascular accident - Runs In Family Heart Attack - Mother   SOCIAL HISTORY: Marital Status: Married Preferred Language: English; Ethnicity: Not Hispanic Or Latino; Race: White Current Smoking Status: Patient has never smoked.   Tobacco Use Assessment Completed: Used Tobacco in last 30 days? Has never drank.  Drinks 1 caffeinated drink per day. Patient's occupation is/was Retired.     Notes: Never a smoker, Married, Mother deceased, Retired, Caffeine use, Number of children, Alcohol use   REVIEW OF SYSTEMS:    GU Review Male:   Patient reports frequent urination, burning/ pain with urination, and leakage of urine. Patient denies hard to postpone urination, get up at night to urinate, stream starts and stops, trouble starting your stream, have to strain to urinate , erection problems, and penile pain.  Gastrointestinal (Upper):   Patient denies nausea, vomiting, and indigestion/ heartburn.  Gastrointestinal (Lower):   Patient denies diarrhea and constipation.  Constitutional:   Patient denies fever, night sweats, weight loss, and fatigue.  Skin:   Patient denies skin rash/ lesion and itching.  Eyes:   Patient denies blurred vision and double vision.  Ears/ Nose/ Throat:   Patient denies sore throat and sinus problems.  Hematologic/Lymphatic:   Patient denies swollen glands and easy bruising.  Cardiovascular:   Patient denies leg swelling and chest pains.  Respiratory:   Patient denies cough and shortness of breath.  Endocrine:   Patient denies excessive thirst.  Musculoskeletal:   Patient denies back pain and joint pain.  Neurological:   Patient denies headaches and dizziness.  Psychologic:   Patient denies depression and anxiety.   Notes: Gross hematuria    VITAL SIGNS:       06/26/2020 01:20 PM  Weight 150 lb / 68.04 kg  BP 167/76 mmHg  Pulse 80 /min   MULTI-SYSTEM PHYSICAL EXAMINATION:    Constitutional: Well-nourished. No physical deformities. Normally developed. Good grooming.  Respiratory: Normal breath sounds. No labored breathing, no use of accessory muscles.   Cardiovascular: Regular rate and rhythm. No murmur, no gallop. Normal temperature, normal extremity pulses, no swelling, no varicosities.      Complexity of Data:  Source Of History:  Patient  Lab Test Review:   BUN/Creatinine, BMP  Records Review:   Previous Doctor Records, Previous Hospital Records, Previous Patient Records  X-Ray Review: Lasix Renogram: Reviewed Films. Reviewed Report. Discussed With Patient.     PROCEDURES:          Urinalysis w/Scope Dipstick Dipstick Cont'd Micro  Color: Yellow Bilirubin: Neg mg/dL WBC/hpf: 20 - 98/QUC  Appearance: Cloudy Ketones: Neg mg/dL RBC/hpf: 40 - 75/VTW  Specific Gravity: 1.020 Blood: 3+ ery/uL Bacteria: Few (10-25/hpf)  pH: 6.0 Protein: 1+ mg/dL Cystals: NS (Not Seen)  Glucose: 3+ mg/dL Urobilinogen: 0.2 mg/dL Casts: NS (Not Seen)    Nitrites: Neg Trichomonas: Not Present    Leukocyte Esterase: 3+ leu/uL Mucous: Not Present      Epithelial Cells: NS (Not Seen)      Yeast: NS (Not  Seen)      Sperm: Not Present    ASSESSMENT:      ICD-10 Details  1 GU:   Hydronephrosis - N13.0   2   Urinary Frequency - R35.0    PLAN:           Orders Labs BMP, Urine Culture  X-Rays: Renal Ultrasound          Schedule         Document Letter(s):  Created for Patient: Clinical Summary         Notes:   Patient has a long history of left hydronephrosis which is almost certainly from reflux. However, it appears recently he has had some worsening of his renal function. Our plan is to obtain a renal ultrasound today and some labs. If he has persistently elevated creatinine and worsening of hydronephrosis we would plan to go the OR for diagnostic  ureteroscopy. Will keep in touch with the results of his test and go from there.   I have given the patient some samples of Gemtesa for his OAB. I think he would probably benefit also from working with physical therapy once we sort out his hydronephrosis and renal dysfunction.

## 2020-07-20 NOTE — Discharge Instructions (Signed)
CYSTOSCOPY HOME CARE INSTRUCTIONS  Activity: Rest for the remainder of the day.  Do not drive or operate equipment today.  You may resume normal activities in one to two days as instructed by your physician.   Meals: Drink plenty of liquids and eat light foods such as gelatin or soup this evening.  You may return to a normal meal plan tomorrow.  Return to Work: You may return to work in one to two days or as instructed by your physician.  Special Instructions / Symptoms: Call your physician if any of these symptoms occur:   -persistent or heavy bleeding  -bleeding which continues after first few urination  -large blood clots that are difficult to pass  -urine stream diminishes or stops completely  -fever equal to or higher than 101 degrees Farenheit.  -cloudy urine with a strong, foul odor  -severe pain   

## 2020-07-20 NOTE — Interval H&P Note (Signed)
History and Physical Interval Note:  07/20/2020 10:08 AM  Brendan Meyer  has presented today for surgery, with the diagnosis of ACUTE RENAL FAILURE, HYDRONEPHROSIS URGE INCONTINENCE.  The various methods of treatment have been discussed with the patient and family. After consideration of risks, benefits and other options for treatment, the patient has consented to  Procedure(s): CYSTOSCOPY WITH RETROGRADE PYELOGRAM (Bilateral) BOTOX INJECTION (N/A) as a surgical intervention.  The patient's history has been reviewed, patient examined, no change in status, stable for surgery.  I have reviewed the patient's chart and labs.  Questions were answered to the patient's satisfaction.     Ardis Hughs

## 2020-07-20 NOTE — Anesthesia Procedure Notes (Signed)
Procedure Name: LMA Insertion Date/Time: 07/20/2020 10:22 AM Performed by: Sharlette Dense, CRNA Patient Re-evaluated:Patient Re-evaluated prior to induction Oxygen Delivery Method: Circle system utilized Preoxygenation: Pre-oxygenation with 100% oxygen Induction Type: IV induction Ventilation: Mask ventilation without difficulty LMA: LMA inserted LMA Size: 4.0 Number of attempts: 1 Placement Confirmation: positive ETCO2 and breath sounds checked- equal and bilateral Tube secured with: Tape Dental Injury: Teeth and Oropharynx as per pre-operative assessment

## 2020-07-21 ENCOUNTER — Encounter (HOSPITAL_COMMUNITY): Payer: Self-pay | Admitting: Urology

## 2020-07-23 ENCOUNTER — Inpatient Hospital Stay: Payer: Medicare HMO

## 2020-07-23 ENCOUNTER — Inpatient Hospital Stay: Payer: Medicare HMO | Admitting: Oncology

## 2020-07-31 DIAGNOSIS — N130 Hydronephrosis with ureteropelvic junction obstruction: Secondary | ICD-10-CM | POA: Diagnosis not present

## 2020-07-31 DIAGNOSIS — R338 Other retention of urine: Secondary | ICD-10-CM | POA: Diagnosis not present

## 2020-08-06 DIAGNOSIS — N130 Hydronephrosis with ureteropelvic junction obstruction: Secondary | ICD-10-CM | POA: Diagnosis not present

## 2020-08-06 DIAGNOSIS — R350 Frequency of micturition: Secondary | ICD-10-CM | POA: Diagnosis not present

## 2020-08-13 DIAGNOSIS — L570 Actinic keratosis: Secondary | ICD-10-CM | POA: Diagnosis not present

## 2020-08-13 DIAGNOSIS — L820 Inflamed seborrheic keratosis: Secondary | ICD-10-CM | POA: Diagnosis not present

## 2020-08-13 DIAGNOSIS — Z86008 Personal history of in-situ neoplasm of other site: Secondary | ICD-10-CM | POA: Diagnosis not present

## 2020-08-23 DIAGNOSIS — R338 Other retention of urine: Secondary | ICD-10-CM | POA: Diagnosis not present

## 2020-10-01 DIAGNOSIS — R350 Frequency of micturition: Secondary | ICD-10-CM | POA: Diagnosis not present

## 2020-10-01 DIAGNOSIS — N130 Hydronephrosis with ureteropelvic junction obstruction: Secondary | ICD-10-CM | POA: Diagnosis not present

## 2020-10-01 DIAGNOSIS — R3915 Urgency of urination: Secondary | ICD-10-CM | POA: Diagnosis not present

## 2020-10-01 DIAGNOSIS — N401 Enlarged prostate with lower urinary tract symptoms: Secondary | ICD-10-CM | POA: Diagnosis not present

## 2020-10-22 DIAGNOSIS — E119 Type 2 diabetes mellitus without complications: Secondary | ICD-10-CM | POA: Diagnosis not present

## 2020-10-24 DIAGNOSIS — E785 Hyperlipidemia, unspecified: Secondary | ICD-10-CM | POA: Diagnosis not present

## 2020-10-24 DIAGNOSIS — Z794 Long term (current) use of insulin: Secondary | ICD-10-CM | POA: Diagnosis not present

## 2020-10-24 DIAGNOSIS — H409 Unspecified glaucoma: Secondary | ICD-10-CM | POA: Diagnosis not present

## 2020-10-24 DIAGNOSIS — I700 Atherosclerosis of aorta: Secondary | ICD-10-CM | POA: Diagnosis not present

## 2020-10-24 DIAGNOSIS — E559 Vitamin D deficiency, unspecified: Secondary | ICD-10-CM | POA: Diagnosis not present

## 2020-10-24 DIAGNOSIS — F5104 Psychophysiologic insomnia: Secondary | ICD-10-CM | POA: Diagnosis not present

## 2020-10-24 DIAGNOSIS — D649 Anemia, unspecified: Secondary | ICD-10-CM | POA: Diagnosis not present

## 2020-10-24 DIAGNOSIS — I5189 Other ill-defined heart diseases: Secondary | ICD-10-CM | POA: Diagnosis not present

## 2020-10-24 DIAGNOSIS — C20 Malignant neoplasm of rectum: Secondary | ICD-10-CM | POA: Diagnosis not present

## 2020-10-24 DIAGNOSIS — N1339 Other hydronephrosis: Secondary | ICD-10-CM | POA: Diagnosis not present

## 2020-10-24 DIAGNOSIS — R251 Tremor, unspecified: Secondary | ICD-10-CM | POA: Diagnosis not present

## 2020-10-24 DIAGNOSIS — F418 Other specified anxiety disorders: Secondary | ICD-10-CM | POA: Diagnosis not present

## 2020-10-24 DIAGNOSIS — N200 Calculus of kidney: Secondary | ICD-10-CM | POA: Diagnosis not present

## 2020-10-24 DIAGNOSIS — E1140 Type 2 diabetes mellitus with diabetic neuropathy, unspecified: Secondary | ICD-10-CM | POA: Diagnosis not present

## 2020-10-24 DIAGNOSIS — I4891 Unspecified atrial fibrillation: Secondary | ICD-10-CM | POA: Diagnosis not present

## 2020-10-24 DIAGNOSIS — N401 Enlarged prostate with lower urinary tract symptoms: Secondary | ICD-10-CM | POA: Diagnosis not present

## 2020-10-24 DIAGNOSIS — N1831 Chronic kidney disease, stage 3a: Secondary | ICD-10-CM | POA: Diagnosis not present

## 2020-10-24 DIAGNOSIS — I2510 Atherosclerotic heart disease of native coronary artery without angina pectoris: Secondary | ICD-10-CM | POA: Diagnosis not present

## 2020-11-21 ENCOUNTER — Other Ambulatory Visit: Payer: Self-pay | Admitting: Surgery

## 2020-11-21 DIAGNOSIS — C20 Malignant neoplasm of rectum: Secondary | ICD-10-CM

## 2020-11-23 DIAGNOSIS — H401132 Primary open-angle glaucoma, bilateral, moderate stage: Secondary | ICD-10-CM | POA: Diagnosis not present

## 2020-11-23 DIAGNOSIS — Z7984 Long term (current) use of oral hypoglycemic drugs: Secondary | ICD-10-CM | POA: Diagnosis not present

## 2020-11-23 DIAGNOSIS — H2511 Age-related nuclear cataract, right eye: Secondary | ICD-10-CM | POA: Diagnosis not present

## 2020-11-27 ENCOUNTER — Other Ambulatory Visit: Payer: Self-pay | Admitting: Surgery

## 2020-11-27 ENCOUNTER — Ambulatory Visit
Admission: RE | Admit: 2020-11-27 | Discharge: 2020-11-27 | Disposition: A | Discharge status: Home / Self Care | Payer: Medicare HMO | Source: Ambulatory Visit | Attending: Surgery | Admitting: Surgery

## 2020-11-27 ENCOUNTER — Other Ambulatory Visit: Payer: Self-pay

## 2020-11-27 DIAGNOSIS — C20 Malignant neoplasm of rectum: Secondary | ICD-10-CM

## 2020-12-05 ENCOUNTER — Other Ambulatory Visit: Payer: Self-pay

## 2020-12-05 ENCOUNTER — Ambulatory Visit
Admission: RE | Admit: 2020-12-05 | Discharge: 2020-12-05 | Disposition: A | Discharge status: Home / Self Care | Payer: Medicare HMO | Source: Ambulatory Visit | Attending: Surgery | Admitting: Surgery

## 2020-12-05 DIAGNOSIS — N3289 Other specified disorders of bladder: Secondary | ICD-10-CM | POA: Diagnosis not present

## 2020-12-05 DIAGNOSIS — N132 Hydronephrosis with renal and ureteral calculous obstruction: Secondary | ICD-10-CM | POA: Diagnosis not present

## 2020-12-05 DIAGNOSIS — C20 Malignant neoplasm of rectum: Secondary | ICD-10-CM | POA: Diagnosis not present

## 2020-12-12 IMAGING — MR MR PELVIS W/O CM
5 of 7 series · 30 of 48 positions shown · non-contrast
Comparison: CT 11/01/2018, 12/04/2016

CLINICAL DATA: Follow-up rectal cancer. Rectal cancer demonstrated
inferior anterior rectum on remote CT scan. Transanal excision
01/06/2017. T2 disease. Post radiation therapy.

EXAM:
MRI PELVIS WITHOUT CONTRAST
TECHNIQUE: Multiplanar multisequence MR imaging of the pelvis was performed. No
intravenous contrast was administered.
100 cc rectal gel administered per rectum

[Series 2: T2 fat-sat · axial · 5.0mm · 1.16mm/px · z∈[-116,+124]mm · 6 of 41 slices shown]
[im 1/41]
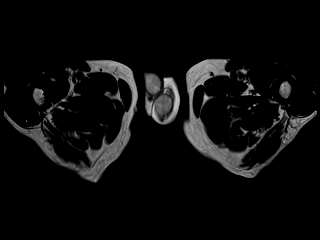
[im 9/41]
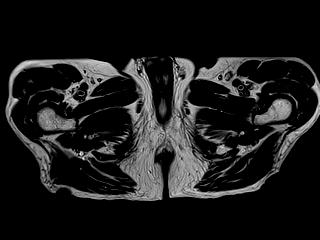
[im 17/41]
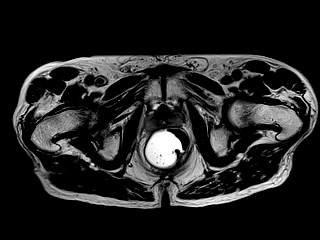
[im 25/41]
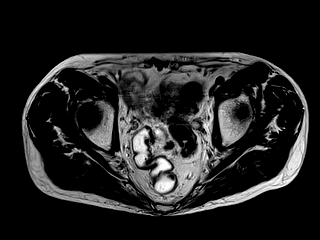
[im 33/41]
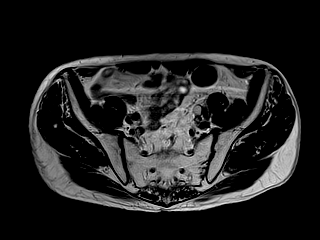
[im 41/41]
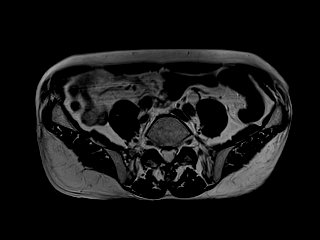

[Series 4: t2_tse_sag · sagittal · 3.0mm · 0.74mm/px · 6 of 37 slices shown]
[im 1/37]
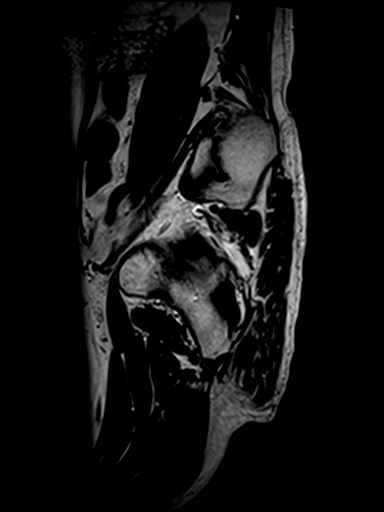
[im 8/37]
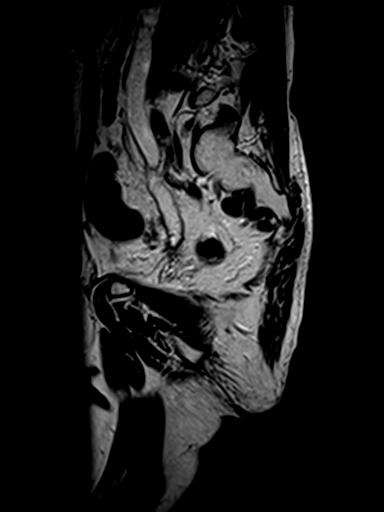
[im 15/37]
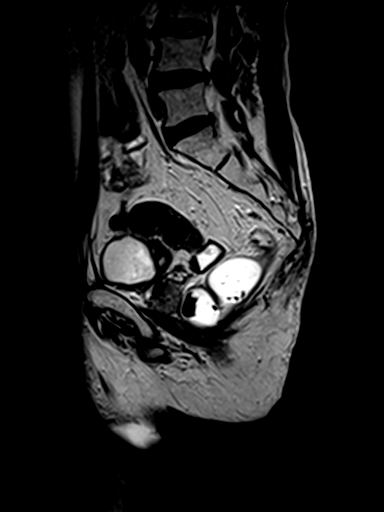
[im 22/37]
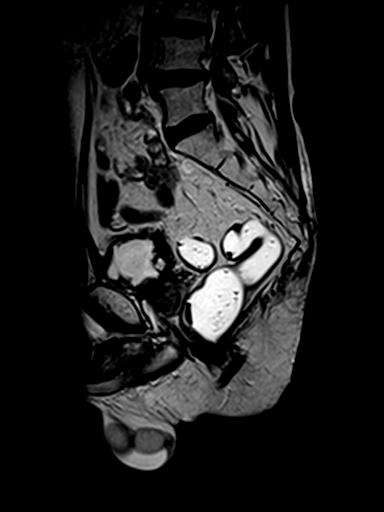
[im 29/37]
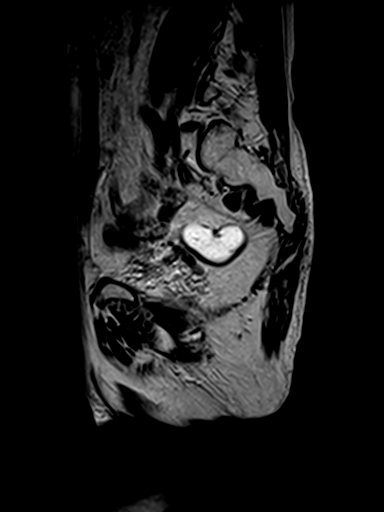
[im 37/37]
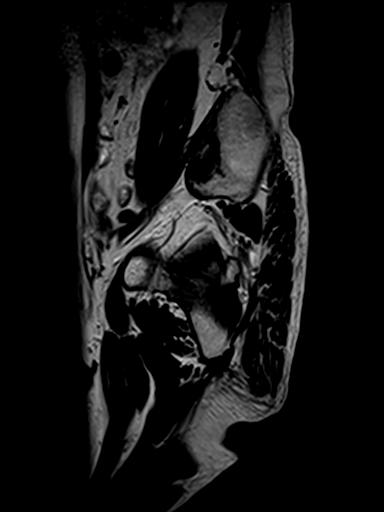

[Series 5: T2 · axial · 3.0mm · 0.70mm/px · z∈[-36,+94]mm · 8 of 50 slices shown (1 of 2)]
[im 1/50]
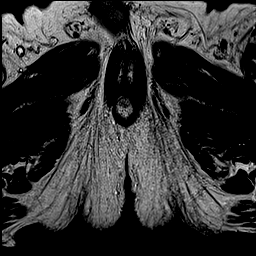
[im 8/50]
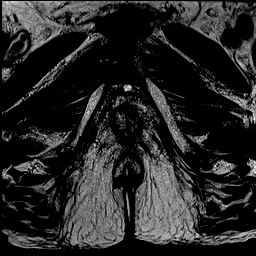
[im 15/50]
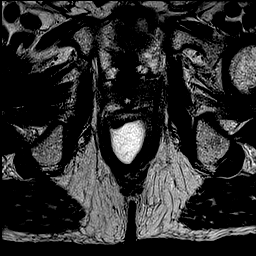
[im 22/50]
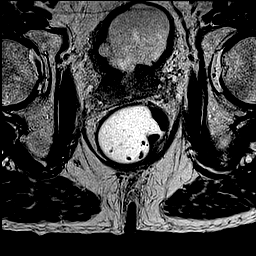
[im 29/50]
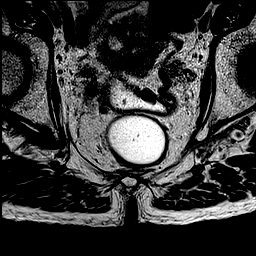
[im 36/50]
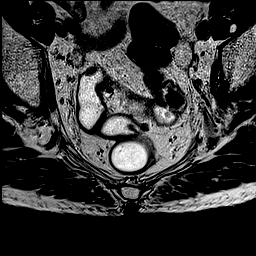
[im 43/50]
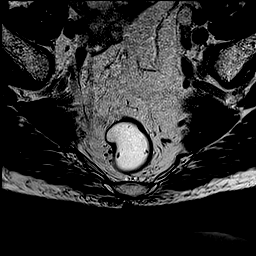
[im 50/50]
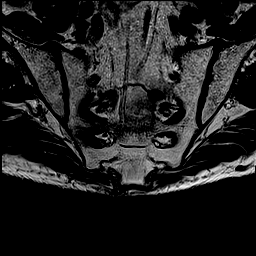

[Series 6: T2 · oblique · 3.0mm · 0.35mm/px · 6 of 42 slices shown (2 of 2)]
[im 1/42]
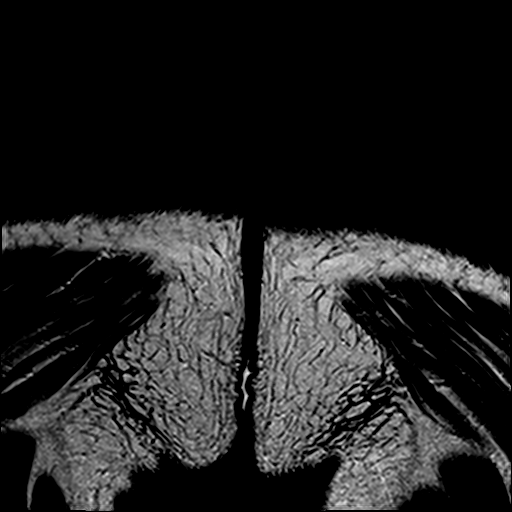
[im 9/42]
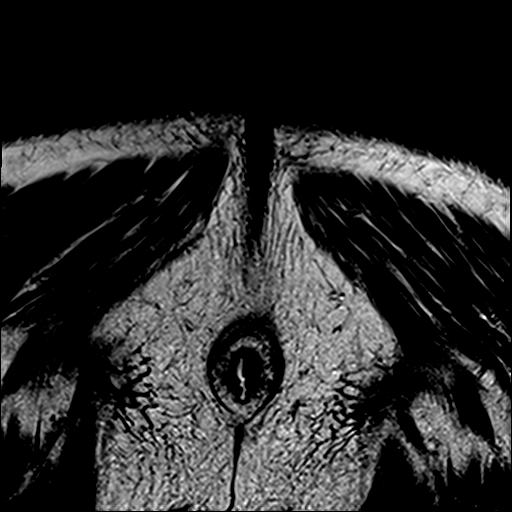
[im 17/42]
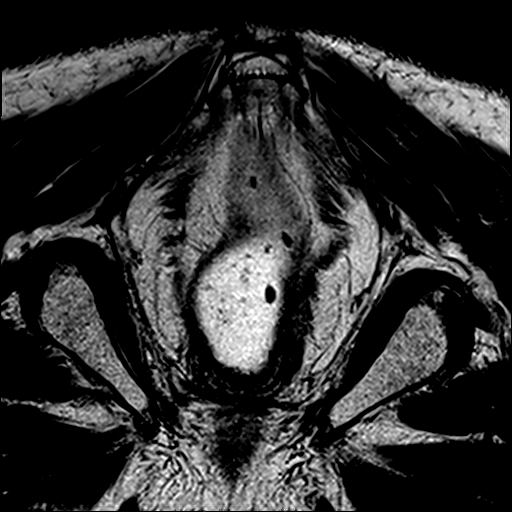
[im 25/42]
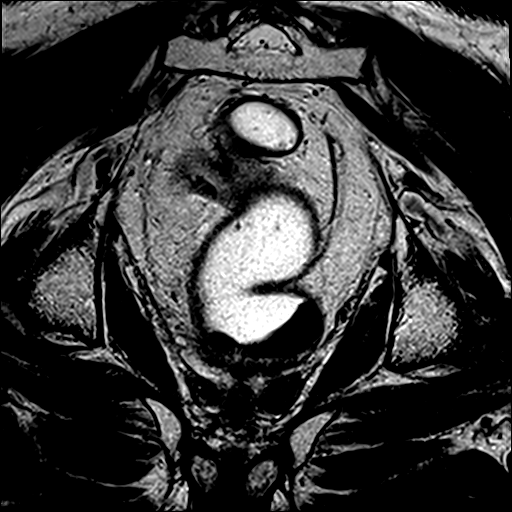
[im 33/42]
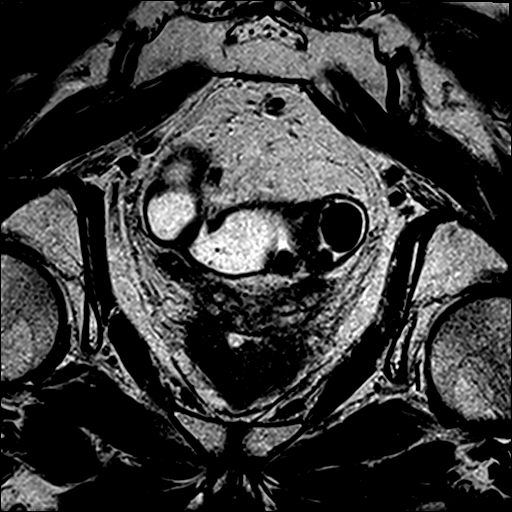
[im 42/42]
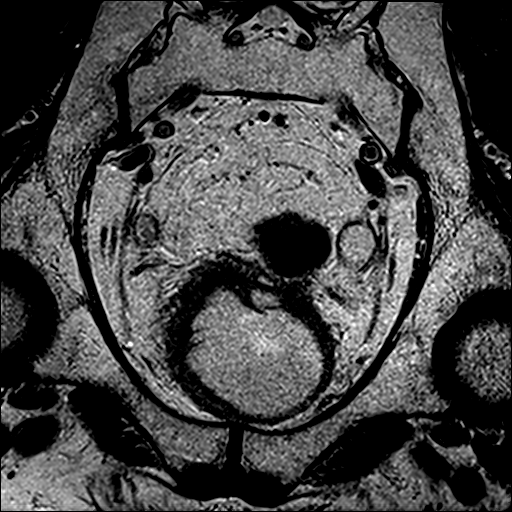

[Series 7: t2_tse_cor repeat · coronal · 3.0mm · 0.35mm/px · 4 of 42 slices shown]
[im 1/42]
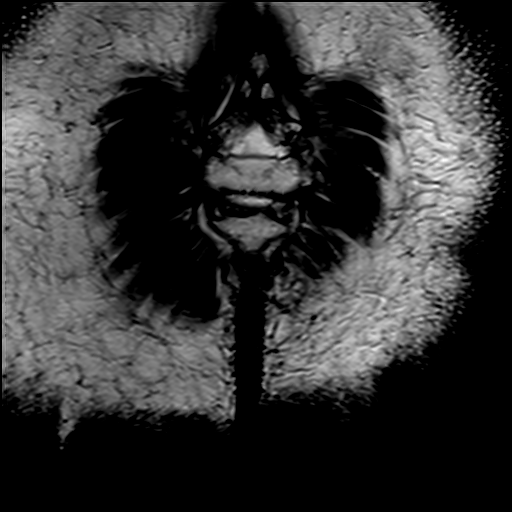
[im 9/42]
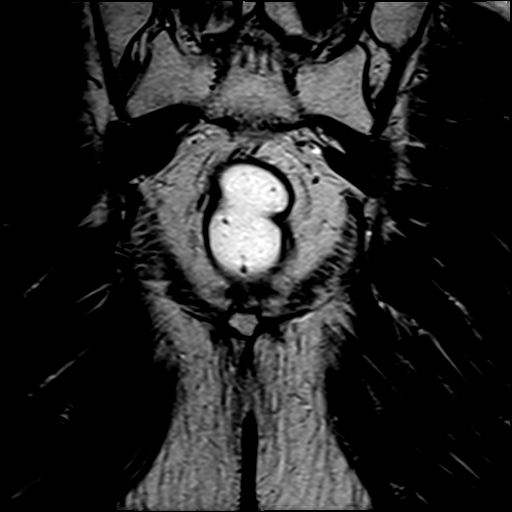
[im 17/42]
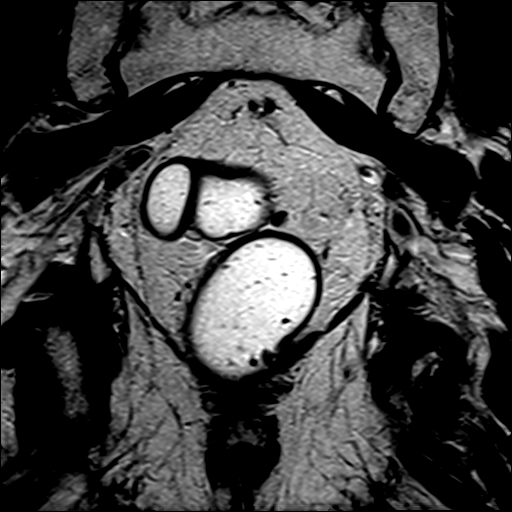
[im 25/42]
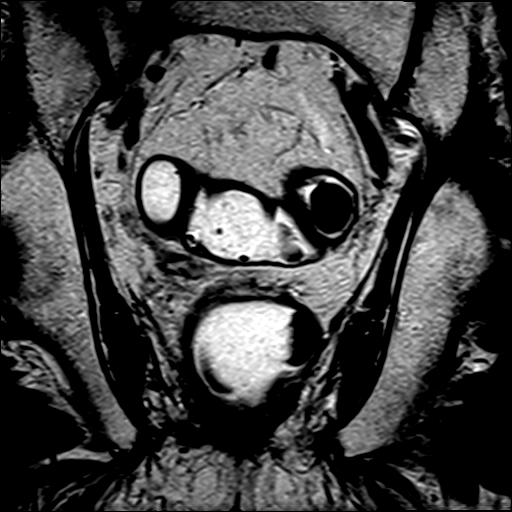

[30 of 48 positions shown; findings below may reference images not displayed]

FINDINGS: Urinary Tract: bladder wall is thickened irregular. Bilateral
hydroureter noted.

Bowel: The rectum is expanded by the rectal gel. No lesion
identified in the distal rectum at site of prior lesion seen on
comparison CT. More proximally the rectum and sigmoid colon appear
normal.

Vascular/Lymphatic: No pelvic lymphadenopathy.

Reproductive: Prostate gland is mildly enlarged measuring 48 mm
diameter. There is a TURP defect.

Other:  No free fluid

Musculoskeletal: No aggressive osseous lead
IMPRESSION: 1. No evidence of rectal carcinoma recurrence within the distal
rectum. The rectum is well expanded by the gel.
2. No evidence of pelvic lymphadenopathy.
3. Thickened bladder wall with bilateral hydroureter. LEFT
hydroureter demonstrated on CT 11/01/2018. TURP defect.

## 2021-01-10 DIAGNOSIS — E119 Type 2 diabetes mellitus without complications: Secondary | ICD-10-CM | POA: Diagnosis not present

## 2021-02-11 DIAGNOSIS — R350 Frequency of micturition: Secondary | ICD-10-CM | POA: Diagnosis not present

## 2021-02-11 DIAGNOSIS — N401 Enlarged prostate with lower urinary tract symptoms: Secondary | ICD-10-CM | POA: Diagnosis not present

## 2021-02-27 DIAGNOSIS — I700 Atherosclerosis of aorta: Secondary | ICD-10-CM | POA: Diagnosis not present

## 2021-02-27 DIAGNOSIS — Z794 Long term (current) use of insulin: Secondary | ICD-10-CM | POA: Diagnosis not present

## 2021-02-27 DIAGNOSIS — R251 Tremor, unspecified: Secondary | ICD-10-CM | POA: Diagnosis not present

## 2021-02-27 DIAGNOSIS — N1339 Other hydronephrosis: Secondary | ICD-10-CM | POA: Diagnosis not present

## 2021-02-27 DIAGNOSIS — N1831 Chronic kidney disease, stage 3a: Secondary | ICD-10-CM | POA: Diagnosis not present

## 2021-02-27 DIAGNOSIS — E785 Hyperlipidemia, unspecified: Secondary | ICD-10-CM | POA: Diagnosis not present

## 2021-02-27 DIAGNOSIS — E1140 Type 2 diabetes mellitus with diabetic neuropathy, unspecified: Secondary | ICD-10-CM | POA: Diagnosis not present

## 2021-02-27 DIAGNOSIS — N183 Chronic kidney disease, stage 3 unspecified: Secondary | ICD-10-CM | POA: Diagnosis not present

## 2021-02-27 DIAGNOSIS — I4891 Unspecified atrial fibrillation: Secondary | ICD-10-CM | POA: Diagnosis not present

## 2021-02-27 DIAGNOSIS — D649 Anemia, unspecified: Secondary | ICD-10-CM | POA: Diagnosis not present

## 2021-02-27 DIAGNOSIS — N401 Enlarged prostate with lower urinary tract symptoms: Secondary | ICD-10-CM | POA: Diagnosis not present

## 2021-02-27 DIAGNOSIS — E559 Vitamin D deficiency, unspecified: Secondary | ICD-10-CM | POA: Diagnosis not present

## 2021-03-04 DIAGNOSIS — N1831 Chronic kidney disease, stage 3a: Secondary | ICD-10-CM | POA: Diagnosis not present

## 2021-04-24 DIAGNOSIS — E119 Type 2 diabetes mellitus without complications: Secondary | ICD-10-CM | POA: Diagnosis not present

## 2021-06-04 DIAGNOSIS — H401132 Primary open-angle glaucoma, bilateral, moderate stage: Secondary | ICD-10-CM | POA: Diagnosis not present

## 2021-06-04 DIAGNOSIS — H2511 Age-related nuclear cataract, right eye: Secondary | ICD-10-CM | POA: Diagnosis not present

## 2021-06-04 DIAGNOSIS — Z7984 Long term (current) use of oral hypoglycemic drugs: Secondary | ICD-10-CM | POA: Diagnosis not present

## 2021-06-11 ENCOUNTER — Other Ambulatory Visit: Payer: Self-pay | Admitting: Surgery

## 2021-06-11 DIAGNOSIS — C20 Malignant neoplasm of rectum: Secondary | ICD-10-CM

## 2021-06-13 DIAGNOSIS — R350 Frequency of micturition: Secondary | ICD-10-CM | POA: Diagnosis not present

## 2021-07-19 DIAGNOSIS — E119 Type 2 diabetes mellitus without complications: Secondary | ICD-10-CM | POA: Diagnosis not present

## 2021-07-22 DIAGNOSIS — I5189 Other ill-defined heart diseases: Secondary | ICD-10-CM | POA: Diagnosis not present

## 2021-07-22 DIAGNOSIS — E1140 Type 2 diabetes mellitus with diabetic neuropathy, unspecified: Secondary | ICD-10-CM | POA: Diagnosis not present

## 2021-07-22 DIAGNOSIS — I2510 Atherosclerotic heart disease of native coronary artery without angina pectoris: Secondary | ICD-10-CM | POA: Diagnosis not present

## 2021-07-22 DIAGNOSIS — N401 Enlarged prostate with lower urinary tract symptoms: Secondary | ICD-10-CM | POA: Diagnosis not present

## 2021-07-22 DIAGNOSIS — I4891 Unspecified atrial fibrillation: Secondary | ICD-10-CM | POA: Diagnosis not present

## 2021-07-22 DIAGNOSIS — E785 Hyperlipidemia, unspecified: Secondary | ICD-10-CM | POA: Diagnosis not present

## 2021-07-22 DIAGNOSIS — N1831 Chronic kidney disease, stage 3a: Secondary | ICD-10-CM | POA: Diagnosis not present

## 2021-07-22 DIAGNOSIS — I700 Atherosclerosis of aorta: Secondary | ICD-10-CM | POA: Diagnosis not present

## 2021-07-22 DIAGNOSIS — Z85048 Personal history of other malignant neoplasm of rectum, rectosigmoid junction, and anus: Secondary | ICD-10-CM | POA: Diagnosis not present

## 2021-07-29 DIAGNOSIS — L820 Inflamed seborrheic keratosis: Secondary | ICD-10-CM | POA: Diagnosis not present

## 2021-07-29 DIAGNOSIS — L570 Actinic keratosis: Secondary | ICD-10-CM | POA: Diagnosis not present

## 2021-07-29 DIAGNOSIS — D485 Neoplasm of uncertain behavior of skin: Secondary | ICD-10-CM | POA: Diagnosis not present

## 2021-07-29 DIAGNOSIS — Z86008 Personal history of in-situ neoplasm of other site: Secondary | ICD-10-CM | POA: Diagnosis not present

## 2021-07-29 DIAGNOSIS — C44229 Squamous cell carcinoma of skin of left ear and external auricular canal: Secondary | ICD-10-CM | POA: Diagnosis not present

## 2021-07-29 DIAGNOSIS — Z129 Encounter for screening for malignant neoplasm, site unspecified: Secondary | ICD-10-CM | POA: Diagnosis not present

## 2021-07-29 DIAGNOSIS — L821 Other seborrheic keratosis: Secondary | ICD-10-CM | POA: Diagnosis not present

## 2021-08-08 ENCOUNTER — Other Ambulatory Visit: Payer: Medicare HMO

## 2021-08-13 DIAGNOSIS — C44229 Squamous cell carcinoma of skin of left ear and external auricular canal: Secondary | ICD-10-CM | POA: Diagnosis not present

## 2021-08-20 DIAGNOSIS — L905 Scar conditions and fibrosis of skin: Secondary | ICD-10-CM | POA: Diagnosis not present

## 2021-09-25 DIAGNOSIS — N189 Chronic kidney disease, unspecified: Secondary | ICD-10-CM | POA: Diagnosis not present

## 2021-09-25 DIAGNOSIS — E1122 Type 2 diabetes mellitus with diabetic chronic kidney disease: Secondary | ICD-10-CM | POA: Diagnosis not present

## 2021-09-25 DIAGNOSIS — N1832 Chronic kidney disease, stage 3b: Secondary | ICD-10-CM | POA: Diagnosis not present

## 2021-09-25 DIAGNOSIS — Z85048 Personal history of other malignant neoplasm of rectum, rectosigmoid junction, and anus: Secondary | ICD-10-CM | POA: Diagnosis not present

## 2021-09-27 ENCOUNTER — Other Ambulatory Visit: Payer: Medicare HMO

## 2021-10-04 ENCOUNTER — Ambulatory Visit
Admission: RE | Admit: 2021-10-04 | Discharge: 2021-10-04 | Disposition: A | Discharge status: Home / Self Care | Payer: Medicare HMO | Source: Ambulatory Visit | Attending: Surgery | Admitting: Surgery

## 2021-10-04 DIAGNOSIS — K5730 Diverticulosis of large intestine without perforation or abscess without bleeding: Secondary | ICD-10-CM | POA: Diagnosis not present

## 2021-10-04 DIAGNOSIS — C20 Malignant neoplasm of rectum: Secondary | ICD-10-CM

## 2021-10-04 DIAGNOSIS — K6289 Other specified diseases of anus and rectum: Secondary | ICD-10-CM | POA: Diagnosis not present

## 2021-10-04 DIAGNOSIS — N134 Hydroureter: Secondary | ICD-10-CM | POA: Diagnosis not present

## 2021-10-04 MED ORDER — GADOBENATE DIMEGLUMINE 529 MG/ML IV SOLN
13.0000 mL | Freq: Once | INTRAVENOUS | Status: AC | PRN
  Administered 2021-10-04: 13 mL via INTRAVENOUS

## 2021-10-07 DIAGNOSIS — E119 Type 2 diabetes mellitus without complications: Secondary | ICD-10-CM | POA: Diagnosis not present

## 2021-10-16 DIAGNOSIS — L905 Scar conditions and fibrosis of skin: Secondary | ICD-10-CM | POA: Diagnosis not present

## 2021-10-28 DIAGNOSIS — Z91199 Patient's noncompliance with other medical treatment and regimen due to unspecified reason: Secondary | ICD-10-CM | POA: Diagnosis not present

## 2021-10-28 DIAGNOSIS — C20 Malignant neoplasm of rectum: Secondary | ICD-10-CM | POA: Diagnosis not present

## 2021-10-29 ENCOUNTER — Other Ambulatory Visit: Payer: Self-pay | Admitting: Surgery

## 2021-10-29 DIAGNOSIS — C20 Malignant neoplasm of rectum: Secondary | ICD-10-CM

## 2021-10-29 DIAGNOSIS — H401132 Primary open-angle glaucoma, bilateral, moderate stage: Secondary | ICD-10-CM | POA: Diagnosis not present

## 2021-10-29 DIAGNOSIS — Z91199 Patient's noncompliance with other medical treatment and regimen due to unspecified reason: Secondary | ICD-10-CM

## 2021-10-29 DIAGNOSIS — H25811 Combined forms of age-related cataract, right eye: Secondary | ICD-10-CM | POA: Diagnosis not present

## 2021-11-11 DIAGNOSIS — H25811 Combined forms of age-related cataract, right eye: Secondary | ICD-10-CM | POA: Diagnosis not present

## 2021-11-11 DIAGNOSIS — E113292 Type 2 diabetes mellitus with mild nonproliferative diabetic retinopathy without macular edema, left eye: Secondary | ICD-10-CM | POA: Diagnosis not present

## 2021-11-11 DIAGNOSIS — H26492 Other secondary cataract, left eye: Secondary | ICD-10-CM | POA: Diagnosis not present

## 2021-11-11 DIAGNOSIS — H401132 Primary open-angle glaucoma, bilateral, moderate stage: Secondary | ICD-10-CM | POA: Diagnosis not present

## 2021-12-03 DIAGNOSIS — F5104 Psychophysiologic insomnia: Secondary | ICD-10-CM | POA: Diagnosis not present

## 2021-12-03 DIAGNOSIS — E1140 Type 2 diabetes mellitus with diabetic neuropathy, unspecified: Secondary | ICD-10-CM | POA: Diagnosis not present

## 2021-12-03 DIAGNOSIS — E785 Hyperlipidemia, unspecified: Secondary | ICD-10-CM | POA: Diagnosis not present

## 2021-12-03 DIAGNOSIS — N401 Enlarged prostate with lower urinary tract symptoms: Secondary | ICD-10-CM | POA: Diagnosis not present

## 2021-12-03 DIAGNOSIS — D649 Anemia, unspecified: Secondary | ICD-10-CM | POA: Diagnosis not present

## 2021-12-03 DIAGNOSIS — I700 Atherosclerosis of aorta: Secondary | ICD-10-CM | POA: Diagnosis not present

## 2021-12-03 DIAGNOSIS — Z85048 Personal history of other malignant neoplasm of rectum, rectosigmoid junction, and anus: Secondary | ICD-10-CM | POA: Diagnosis not present

## 2021-12-03 DIAGNOSIS — N1831 Chronic kidney disease, stage 3a: Secondary | ICD-10-CM | POA: Diagnosis not present

## 2021-12-03 DIAGNOSIS — I2510 Atherosclerotic heart disease of native coronary artery without angina pectoris: Secondary | ICD-10-CM | POA: Diagnosis not present

## 2021-12-05 DIAGNOSIS — H25811 Combined forms of age-related cataract, right eye: Secondary | ICD-10-CM | POA: Diagnosis not present

## 2021-12-17 DIAGNOSIS — H25811 Combined forms of age-related cataract, right eye: Secondary | ICD-10-CM | POA: Diagnosis not present

## 2021-12-17 DIAGNOSIS — H269 Unspecified cataract: Secondary | ICD-10-CM | POA: Diagnosis not present

## 2021-12-26 DIAGNOSIS — N401 Enlarged prostate with lower urinary tract symptoms: Secondary | ICD-10-CM | POA: Diagnosis not present

## 2021-12-26 DIAGNOSIS — R350 Frequency of micturition: Secondary | ICD-10-CM | POA: Diagnosis not present

## 2021-12-30 DIAGNOSIS — E119 Type 2 diabetes mellitus without complications: Secondary | ICD-10-CM | POA: Diagnosis not present

## 2022-01-06 ENCOUNTER — Other Ambulatory Visit: Payer: Self-pay | Admitting: Urology

## 2022-01-16 DIAGNOSIS — H524 Presbyopia: Secondary | ICD-10-CM | POA: Diagnosis not present

## 2022-02-05 DIAGNOSIS — R350 Frequency of micturition: Secondary | ICD-10-CM | POA: Diagnosis not present

## 2022-02-05 DIAGNOSIS — R8279 Other abnormal findings on microbiological examination of urine: Secondary | ICD-10-CM | POA: Diagnosis not present

## 2022-02-07 ENCOUNTER — Encounter (HOSPITAL_BASED_OUTPATIENT_CLINIC_OR_DEPARTMENT_OTHER): Payer: Self-pay | Admitting: Urology

## 2022-02-07 NOTE — Progress Notes (Addendum)
Addendum:   Pt has cardiac clearance via office note by Dr Cathie Olden on 02-10-2022 in epic/ chart.  Pt is low risk for procedure.   Addendum:  Chart reviewed by anesthesia, Dr Roanna Banning MDA, via phone.  Dr Roanna Banning stated pt will need cardiologist visit/ clearance prior to surgery since has not followed up from [facility] 10-02-2017. Called and left message with Dr Louis Meckel OR scheduler, Marlis Edelson, informed her pt will need cardiology follow up/ clearance prior to surgery.   Spoke w/ via phone for pre-op interview--- pt Lab needs dos----   [employer], ekg            Lab results------ no COVID test -----patient states asymptomatic no test needed Arrive at ------- 0645 on 02-14-2022 NPO after MN NO Solid Food.  Clear liquids from MN until--- 0545 Med rec completed Medications to take morning of surgery ----- none Diabetic medication ----- do not take metformin and do not do insulin morning of surgery Patient instructed no nail polish to be worn day of surgery Patient instructed to bring photo id and insurance card day of surgery Patient aware to have Driver (ride ) / caregiver  for 24 hours after surgery -- wife, Brendan Meyer Patient Special Instructions ----- n/a Pre-Op special Istructions ----- n/a Patient verbalized understanding of instructions that were given at this phone interview. Patient denies shortness of breath, chest pain, fever, cough at this phone interview.    Anesthesia Review:  HTN;  CAD s/p CABG x6 and closure PFO 08/ 2011; ICM , last echo 01-02-2017 ef 50-55%;  PAF  episode post op cabg and one in 2019;  CKD 3;  hx rectal cancer s/p resection 07/ 2018 ,  completed chemoradiation 10/ 2018 w/ no recurrence; Pt denies cardiac s&s, sob, and no peripheral  PCP:  Dr Chauncey Cruel. [place] Cardiologist : Dr Gwenlyn Found (lov 10-02-2017 epic, pt questioned if seen by Dr Gwenlyn Found , stated no and does not have another cardiologist)  EKG :  07-30-2020 epic Echo : 01-02-2017 Stress test: no Cardiac Cath :  02-05-2010  epic Activity level: denies sob w/ any activity Sleep Study/ CPAP : no Fasting Blood Sugar :  130s    / Checks Blood Sugar -- times a day:  multiple times has a libre  Blood Thinner/ Instructions /Last Dose: no ASA / Instructions/ Last Dose :  ASA '81mg'$ /  pt stated was given instructions by Dr Louis Meckel office to stop 5 days prior to surgery, stated last dose will be Sunday 02-09-2022

## 2022-02-09 ENCOUNTER — Encounter: Payer: Self-pay | Admitting: Cardiovascular Disease

## 2022-02-09 NOTE — Progress Notes (Signed)
Cardiology Office Note:    Date:  02/10/2022   ID:  Brendan Meyer, DOB 1934/12/16, MRN [id number]  PCP:  Reynold Bowen, [place] Providers Cardiologist:  Gwenlyn Found    Referring MD: Reynold Bowen, MD   Chief Complaint  Patient presents with   Coronary Artery Disease         History of Present Illness:    Brendan Meyer is a 87 y.o. male with a hx of CAD,  CABG  CKD, colon cancer, HLD   I am seeing him as a work in visit for pre op evaluation to boxox injection into his bladder this Friday   He is having lots of urinary frequency.  He is scheduled to have a Botox injection into his bladder.  He had a similar procedure a year ago and tolerated it very well.   Anesthesiology wanted pre op clearance prior to his procedure  Had CABG in Aug. 2011. No CP , no dyspnea Still very active.  Walks when its not so not  Has easy bruising   Worked in [employer].       Past Medical History:  Diagnosis Date   BPH with obstruction/lower urinary tract symptoms    urologist---- dr Louis Meckel   CKD (chronic kidney disease), stage III [facility])    Coronary artery disease    cardiologist--- dr Gwenlyn Found; (02-07-2022 lov 10-02-2017 in epic)  cardiac cath for unstable angina 02-05-2010 multivessel disease;   02-11-2010  s/p CABG  x6 and closure PFO   Full dentures    Glaucoma, both eyes    History of cancer chemotherapy    completed 04-09-2010 for rectal cancer   History of external beam radiation therapy    03-02-2017  to 04-09-2017  rectum   History of kidney stones    Hyperlipidemia    Ischemic cardiomyopathy    first dx 08/ 2011 per TEE ef 30-40%;  last echo 01-02-2017  ef 50-55%   Malignant neoplasm of rectum [facility]) 2018   oncology-- dr sherrill/  surgeon-- dr gross/ gi-- dr Henrene Pastor;  dx 05/ 2018,  s/p partial proctectomy of rectal nodule/ cancer;  completed chemo and radiation 04-09-2017   Neuropathy    toes   OAB (overactive bladder)    PAF  (paroxysmal atrial fibrillation) ([place])    followed by dr Gwenlyn Found;  (02-07-2022  pt denies any issues since 2019)  episode post op cabg 08/ 2011 with rvr ;   another episode 2019 per cardiology note but felt he did not need long term anticoagulation   S/P CABG x 6 02/11/2010   LIMA to LAD,  segSVG to D1 and OM1,  SVG to fourth Diagonal,  SVG to proximal and distal PDA   S/P patent foramen ovale closure 02/11/2010   with CABG surgery   Type 2 diabetes mellitus treated with insulin ([place])    followed by pcp   (02-07-2022  pt stated has libre2, average fasting blood sugar 130s)    Past Surgical History:  Procedure Laterality Date   BOTOX INJECTION N/A 07/20/2020   Procedure: BOTOX INJECTION;  Surgeon: Ardis Hughs, MD;  Location: [facility];  Service: Urology;  Laterality: N/A;   CARDIAC CATHETERIZATION  02/05/2010   '@[facility]'$  by dr Ilda Mori;  unstable angina;   ef 45-50%,  multivessage disease   CATARACT EXTRACTION W/ INTRAOCULAR LENS IMPLANT Bilateral    left 2018;  right 07/ 2023   CIRCUMCISION  1980   COLONOSCOPY  03/22/2018  by dr Henrene Pastor   CORONARY ARTERY BYPASS GRAFT  02/11/2010   @ [place] by dr Cyndia Bent;   x6  and Closure PFO   CYSTOSCOPY W/ RETROGRADES Bilateral 07/20/2020   Procedure: CYSTOSCOPY WITH RETROGRADE PYELOGRAM DIAGNOSTIC URETEROSCOPY;  Surgeon: Ardis Hughs, MD;  Location: [facility];  Service: Urology;  Laterality: Bilateral;   CYSTOSCOPY/RETROGRADE/URETEROSCOPY Bilateral 11/11/2013   Procedure: CYSTOSCOPY, BILATERAL RETROGRADE PYELOGRAM LEFT URETEROSCOPY, COLLECTION OF LEFT URETERAL WASHINGS FOR CYTOLOGY;  Surgeon: Ardis Hughs, MD;  Location: [facility];  Service: Urology;  Laterality: Bilateral;   EUS N/A 12/18/2016   Procedure: LOWER ENDOSCOPIC ULTRASOUND (EUS);  Surgeon: Milus Banister, MD;  Location: [facility];  Service: Endoscopy;  Laterality: N/A;   EXTRACORPOREAL SHOCK WAVE LITHOTRIPSY     yrs ago   INGUINAL HERNIA REPAIR Bilateral 1966   TONSILLECTOMY  age 75    TRANSANAL ENDOSCOPIC MICROSURGERY N/A 01/06/2017   Procedure: PARTIAL PROCTECTOMY OF EARLY RECTAL CANCER, TRANSANAL ENDOSCOPIC MICROSURGERY  ERAS PATHWAY;  Surgeon: Michael Boston, MD;  Location: [facility];  Service: General;  Laterality: N/A;  ERAS PATHWAY   TYMPANOPLASTY Left 1975   "repaired hole in eardrum"   UPPER GASTROINTESTINAL ENDOSCOPY  2011    Current Medications: Current Meds  Medication Sig   Cholecalciferol (VITAMIN D) 2000 UNITS CAPS Take 2,000 Units by mouth daily.   ciprofloxacin (CIPRO) 250 MG tablet Take 250 mg by mouth 2 (two) times daily.   metFORMIN (GLUCOPHAGE) 500 MG tablet Take 500 mg by mouth 2 (two) times daily with a meal.   metoprolol tartrate (LOPRESSOR) 25 MG tablet Take 25 mg by mouth at bedtime.   Multiple Vitamins-Minerals (MULTIVITAMIN WITH MINERALS) tablet Take 1 tablet by mouth daily.   NOVOLOG MIX 70/30 FLEXPEN (70-30) 100 UNIT/ML FlexPen Inject 25 Units into the skin 2 (two) times daily.   simvastatin (ZOCOR) 40 MG tablet Take 40 mg by mouth every evening.   Travoprost, BAK Free, (TRAVATAN) 0.004 % SOLN ophthalmic solution Place 1 drop into both eyes at bedtime.   zolpidem (AMBIEN) 10 MG tablet Take 10 mg by mouth at bedtime.     Allergies:   Patient has no known allergies.   Social History   Socioeconomic History   Marital status: Married    Spouse name: Not on file   Number of children: Not on file   Years of education: Not on file   Highest education level: Not on file  Occupational History   Not on file  Tobacco Use   Smoking status: Never   Smokeless tobacco: Never  Vaping Use   Vaping Use: Never used  Substance and Sexual Activity   Alcohol use: Not Currently   Drug use: Never   Sexual activity: Not on file  Other Topics Concern   Not on file  Social History Narrative   Retired-   Married with one son and two daughters   Patient has never smoked   Alcohol use-no   Daily caffeine use 2 per day   Illicit drug use -no   Social  Determinants of Radio broadcast assistant Strain: Not on file  Food Insecurity: Not on file  Transportation Needs: Not on file  Physical Activity: Not on file  Stress: Not on file  Social Connections: Not on file     Family History: The patient's family history includes Breast cancer in his maternal aunt. There is no history of Colon cancer, Colon polyps, Esophageal cancer, Rectal cancer, or Stomach cancer.  ROS:  Please see the history of present illness.     All other systems reviewed and are negative.  EKGs/Labs/Other Studies Reviewed:    The following studies were reviewed today:   EKG:   Aug. 7, 2023:  NSR with 1st degree AV block .  Old septal infarct.   Recent Labs: No results found for requested labs within last 365 days.  Recent Lipid Panel No results found for: "CHOL", "TRIG", "HDL", "CHOLHDL", "VLDL", "LDLCALC", "LDLDIRECT"   Risk Assessment/Calculations:           Physical Exam:    VS:  BP 128/64   Pulse 89   Ht '5\' 11"'$  (1.803 m)   Wt 145 lb 9.6 oz (66 kg)   SpO2 99%   BMI 20.31 kg/m     Wt Readings from Last 3 Encounters:  02/10/22 145 lb 9.6 oz (66 kg)  07/20/20 149 lb 6.4 oz (67.8 kg)  01/20/20 148 lb 11.2 oz (67.4 kg)     GEN: elderly male,   in no acute distress HEENT: Normal NECK: No JVD; No carotid bruits LYMPHATICS: No lymphadenopathy CARDIAC: RRR, no murmurs, rubs, gallops RESPIRATORY:  Clear to auscultation without rales, wheezing or rhonchi  ABDOMEN: Soft, non-tender, non-distended MUSCULOSKELETAL:  No edema; No deformity  SKIN: Warm and dry NEUROLOGIC:  Alert and oriented x 3 PSYCHIATRIC:  Normal affect   ASSESSMENT:    1. Atherosclerosis of coronary artery bypass graft of native heart with unstable angina pectoris ([place])    PLAN:    In order of problems listed above:   CAD :   s/p CABG in 2011.   Previous Ant . Mi He has no symptoms of angina or dyspnea.   He is at low risk for his botox injections into his bladder    2.  Hyperlipidemia :  cont zocor.  Follow up with Dr. Forde Dandy  Will return to see Dr. Gwenlyn Found in 1 year .           Medication Adjustments/Labs and Tests Ordered: Current medicines are reviewed at length with the patient today.  Concerns regarding medicines are outlined above.  Orders Placed This Encounter  Procedures   EKG 12-Lead   No orders of the defined types were placed in this encounter.   Patient Instructions  Medication Instructions:  Your physician recommends that you continue on your current medications as directed. Please refer to the Current Medication list given to you today.  *If you need a refill on your cardiac medications before your next appointment, please call your pharmacy*   Lab Work: NONE If you have labs (blood work) drawn today and your tests are completely normal, you will receive your results only by: [place] (if you have MyChart) OR A paper copy in the mail If you have any lab test that is abnormal or we need to change your treatment, we will call you to review the results.   Testing/Procedures: NONE (Pt cleared for upcoming bladder surgery)   Follow-Up: At [facility], you and your health needs are our priority.  As part of our continuing mission to provide you with exceptional heart care, we have created designated Provider Care Teams.  These Care Teams include your primary Cardiologist (physician) and Advanced Practice Providers (APPs -  Physician Assistants and Nurse Practitioners) who all work together to provide you with the care you need, when you need it.  We recommend signing up for the patient portal called "MyChart".  Sign up information is provided on  this After Visit Summary.  MyChart is used to connect with patients for Virtual Visits (Telemedicine).  Patients are able to view lab/test results, encounter notes, upcoming appointments, etc.  Non-urgent messages can be sent to your provider as well.   To learn more about what  you can do with MyChart, go to [email or web address].    Your next appointment:   1 year(s)  The format for your next appointment:   In Person  Provider:   Adora Fridge  Important Information About Sugar         Signed, Mertie Moores, MD  02/10/2022 10:08 AM    [place]

## 2022-02-10 ENCOUNTER — Encounter: Payer: Self-pay | Admitting: Cardiovascular Disease

## 2022-02-10 ENCOUNTER — Ambulatory Visit: Payer: Medicare HMO | Admitting: Cardiovascular Disease

## 2022-02-10 VITALS — BP 128/64 | HR 89 | Ht 71.0 in | Wt 145.6 lb

## 2022-02-10 DIAGNOSIS — I25700 Atherosclerosis of coronary artery bypass graft(s), unspecified, with unstable angina pectoris: Secondary | ICD-10-CM | POA: Diagnosis not present

## 2022-02-10 NOTE — Patient Instructions (Signed)
Medication Instructions:  Your physician recommends that you continue on your current medications as directed. Please refer to the Current Medication list given to you today.  *If you need a refill on your cardiac medications before your next appointment, please call your pharmacy*   Lab Work: NONE If you have labs (blood work) drawn today and your tests are completely normal, you will receive your results only by: [place] (if you have MyChart) OR A paper copy in the mail If you have any lab test that is abnormal or we need to change your treatment, we will call you to review the results.   Testing/Procedures: NONE (Pt cleared for upcoming bladder surgery)   Follow-Up: At [facility], you and your health needs are our priority.  As part of our continuing mission to provide you with exceptional heart care, we have created designated Provider Care Teams.  These Care Teams include your primary Cardiologist (physician) and Advanced Practice Providers (APPs -  Physician Assistants and Nurse Practitioners) who all work together to provide you with the care you need, when you need it.  We recommend signing up for the patient portal called "MyChart".  Sign up information is provided on this After Visit Summary.  MyChart is used to connect with patients for Virtual Visits (Telemedicine).  Patients are able to view lab/test results, encounter notes, upcoming appointments, etc.  Non-urgent messages can be sent to your provider as well.   To learn more about what you can do with MyChart, go to [email or web address].    Your next appointment:   1 year(s)  The format for your next appointment:   In Person  Provider:   Adora Fridge  Important Information About Sugar

## 2022-02-13 ENCOUNTER — Ambulatory Visit: Payer: Medicare HMO | Admitting: Cardiovascular Disease

## 2022-02-14 ENCOUNTER — Encounter (HOSPITAL_BASED_OUTPATIENT_CLINIC_OR_DEPARTMENT_OTHER): Payer: Self-pay | Admitting: Urology

## 2022-02-14 ENCOUNTER — Ambulatory Visit (HOSPITAL_BASED_OUTPATIENT_CLINIC_OR_DEPARTMENT_OTHER)
Admission: RE | Admit: 2022-02-14 | Discharge: 2022-02-14 | Disposition: A | Discharge status: Home / Self Care | Payer: Medicare HMO | Attending: Urology | Admitting: Urology

## 2022-02-14 ENCOUNTER — Encounter (HOSPITAL_BASED_OUTPATIENT_CLINIC_OR_DEPARTMENT_OTHER)
Admission: RE | Disposition: A | Discharge status: Home / Self Care | Payer: Self-pay | Source: Home / Self Care | Attending: Urology

## 2022-02-14 ENCOUNTER — Ambulatory Visit (HOSPITAL_BASED_OUTPATIENT_CLINIC_OR_DEPARTMENT_OTHER): Payer: Medicare HMO | Admitting: Anesthesiology

## 2022-02-14 DIAGNOSIS — I480 Paroxysmal atrial fibrillation: Secondary | ICD-10-CM | POA: Insufficient documentation

## 2022-02-14 DIAGNOSIS — I129 Hypertensive chronic kidney disease with stage 1 through stage 4 chronic kidney disease, or unspecified chronic kidney disease: Secondary | ICD-10-CM

## 2022-02-14 DIAGNOSIS — N3281 Overactive bladder: Secondary | ICD-10-CM

## 2022-02-14 DIAGNOSIS — E1122 Type 2 diabetes mellitus with diabetic chronic kidney disease: Secondary | ICD-10-CM

## 2022-02-14 DIAGNOSIS — N189 Chronic kidney disease, unspecified: Secondary | ICD-10-CM | POA: Diagnosis not present

## 2022-02-14 DIAGNOSIS — I2510 Atherosclerotic heart disease of native coronary artery without angina pectoris: Secondary | ICD-10-CM | POA: Diagnosis not present

## 2022-02-14 DIAGNOSIS — E119 Type 2 diabetes mellitus without complications: Secondary | ICD-10-CM | POA: Diagnosis not present

## 2022-02-14 DIAGNOSIS — Z01818 Encounter for other preprocedural examination: Secondary | ICD-10-CM

## 2022-02-14 DIAGNOSIS — Z7984 Long term (current) use of oral hypoglycemic drugs: Secondary | ICD-10-CM | POA: Diagnosis not present

## 2022-02-14 DIAGNOSIS — K219 Gastro-esophageal reflux disease without esophagitis: Secondary | ICD-10-CM | POA: Insufficient documentation

## 2022-02-14 DIAGNOSIS — Z794 Long term (current) use of insulin: Secondary | ICD-10-CM | POA: Insufficient documentation

## 2022-02-14 DIAGNOSIS — I10 Essential (primary) hypertension: Secondary | ICD-10-CM | POA: Diagnosis not present

## 2022-02-14 DIAGNOSIS — Z951 Presence of aortocoronary bypass graft: Secondary | ICD-10-CM | POA: Diagnosis not present

## 2022-02-14 HISTORY — PX: BOTOX INJECTION: SHX5754

## 2022-02-14 HISTORY — DX: Chronic kidney disease, stage 3 unspecified: N18.30

## 2022-02-14 HISTORY — DX: Unspecified glaucoma: H40.9

## 2022-02-14 HISTORY — DX: Ischemic cardiomyopathy: I25.5

## 2022-02-14 HISTORY — DX: Personal history of antineoplastic chemotherapy: Z92.21

## 2022-02-14 HISTORY — DX: Benign prostatic hyperplasia with lower urinary tract symptoms: N40.1

## 2022-02-14 HISTORY — DX: Type 2 diabetes mellitus without complications: E11.9

## 2022-02-14 HISTORY — DX: Personal history of irradiation: Z92.3

## 2022-02-14 HISTORY — DX: Other obstructive and reflux uropathy: N13.8

## 2022-02-14 HISTORY — PX: CYSTOSCOPY: SHX5120

## 2022-02-14 HISTORY — DX: Complete loss of teeth, unspecified cause, unspecified class: Z97.2

## 2022-02-14 HISTORY — DX: Paroxysmal atrial fibrillation: I48.0

## 2022-02-14 HISTORY — DX: Complete loss of teeth, unspecified cause, unspecified class: K08.109

## 2022-02-14 HISTORY — DX: Overactive bladder: N32.81

## 2022-02-14 HISTORY — DX: Long term (current) use of insulin: Z79.4

## 2022-02-14 LAB — POCT I-STAT, CHEM 8
BUN: 29 mg/dL — ABNORMAL HIGH (ref 8–23)
Calcium, Ion: 1.27 mmol/L (ref 1.15–1.40)
Chloride: 104 mmol/L (ref 98–111)
Creatinine, Ser: 2.1 mg/dL — ABNORMAL HIGH (ref 0.61–1.24)
Glucose, Bld: 206 mg/dL — ABNORMAL HIGH (ref 70–99)
HCT: 38 % — ABNORMAL LOW (ref 39.0–52.0)
Hemoglobin: 12.9 g/dL — ABNORMAL LOW (ref 13.0–17.0)
Potassium: 4.7 mmol/L (ref 3.5–5.1)
Sodium: 138 mmol/L (ref 135–145)
TCO2: 24 mmol/L (ref 22–32)

## 2022-02-14 LAB — GLUCOSE, CAPILLARY: Glucose-Capillary: 219 mg/dL — ABNORMAL HIGH (ref 70–99)

## 2022-02-14 SURGERY — CYSTOSCOPY
Anesthesia: General | Site: Bladder

## 2022-02-14 MED ORDER — LIDOCAINE HCL (PF) 2 % IJ SOLN
INTRAMUSCULAR | Status: AC
  Filled 2022-02-14: qty 5

## 2022-02-14 MED ORDER — CEFAZOLIN SODIUM-DEXTROSE 2-4 GM/100ML-% IV SOLN
2.0000 g | INTRAVENOUS | Status: AC
  Administered 2022-02-14: 2 g via INTRAVENOUS

## 2022-02-14 MED ORDER — ACETAMINOPHEN 500 MG PO TABS
1000.0000 mg | ORAL_TABLET | Freq: Once | ORAL | Status: AC
Start: 2022-02-14 — End: 2022-02-14
  Administered 2022-02-14: 1000 mg via ORAL

## 2022-02-14 MED ORDER — ONDANSETRON HCL 4 MG/2ML IJ SOLN
INTRAMUSCULAR | Status: AC
  Filled 2022-02-14: qty 2

## 2022-02-14 MED ORDER — FENTANYL CITRATE (PF) 100 MCG/2ML IJ SOLN
INTRAMUSCULAR | Status: AC
  Filled 2022-02-14: qty 2

## 2022-02-14 MED ORDER — FENTANYL CITRATE (PF) 100 MCG/2ML IJ SOLN: 25.0000 ug | INTRAMUSCULAR | Status: DC | PRN

## 2022-02-14 MED ORDER — CEFAZOLIN SODIUM-DEXTROSE 2-4 GM/100ML-% IV SOLN
INTRAVENOUS | Status: AC
  Filled 2022-02-14: qty 100

## 2022-02-14 MED ORDER — ONABOTULINUMTOXINA 100 UNITS IJ SOLR
INTRAMUSCULAR | Status: DC | PRN
  Administered 2022-02-14: 150 [IU] via INTRAMUSCULAR

## 2022-02-14 MED ORDER — STERILE WATER FOR IRRIGATION IR SOLN
Status: DC | PRN
  Administered 2022-02-14: 3000 mL via INTRAVESICAL

## 2022-02-14 MED ORDER — SODIUM CHLORIDE 0.9 % IV SOLN: INTRAVENOUS | Status: DC

## 2022-02-14 MED ORDER — PROPOFOL 10 MG/ML IV BOLUS
INTRAVENOUS | Status: DC | PRN
  Administered 2022-02-14: 150 mg via INTRAVENOUS

## 2022-02-14 MED ORDER — PROPOFOL 10 MG/ML IV BOLUS
INTRAVENOUS | Status: AC
  Filled 2022-02-14: qty 20

## 2022-02-14 MED ORDER — AMISULPRIDE (ANTIEMETIC) 5 MG/2ML IV SOLN
10.0000 mg | Freq: Once | INTRAVENOUS | Status: DC | PRN
Start: 2022-02-14 — End: 2022-02-14

## 2022-02-14 MED ORDER — FENTANYL CITRATE (PF) 100 MCG/2ML IJ SOLN
INTRAMUSCULAR | Status: DC | PRN
  Administered 2022-02-14: 25 ug via INTRAVENOUS

## 2022-02-14 MED ORDER — DEXAMETHASONE SODIUM PHOSPHATE 10 MG/ML IJ SOLN
INTRAMUSCULAR | Status: AC
  Filled 2022-02-14: qty 1

## 2022-02-14 MED ORDER — ACETAMINOPHEN 500 MG PO TABS
ORAL_TABLET | ORAL | Status: AC
  Filled 2022-02-14: qty 2

## 2022-02-14 MED ORDER — ONDANSETRON HCL 4 MG/2ML IJ SOLN
INTRAMUSCULAR | Status: DC | PRN
  Administered 2022-02-14: 4 mg via INTRAVENOUS

## 2022-02-14 MED ORDER — LIDOCAINE 2% (20 MG/ML) 5 ML SYRINGE
INTRAMUSCULAR | Status: DC | PRN
  Administered 2022-02-14: 60 mg via INTRAVENOUS

## 2022-02-14 MED ORDER — SODIUM CHLORIDE (PF) 0.9 % IJ SOLN
INTRAMUSCULAR | Status: DC | PRN
  Administered 2022-02-14: 15 mL

## 2022-02-14 SURGICAL SUPPLY — 16 items
BAG DRAIN URO-CYSTO SKYTR STRL (DRAIN) ×2 IMPLANT
BAG DRN UROCATH (DRAIN) ×1
CLOTH BEACON ORANGE TIMEOUT ST (SAFETY) ×2 IMPLANT
GLOVE BIO SURGEON STRL SZ7.5 (GLOVE) ×2 IMPLANT
GOWN STRL REUS W/TWL XL LVL3 (GOWN DISPOSABLE) ×4 IMPLANT
KIT TURNOVER CYSTO (KITS) ×2 IMPLANT
MANIFOLD NEPTUNE II (INSTRUMENTS) ×2 IMPLANT
NDL ASPIRATION 22 (NEEDLE) ×1 IMPLANT
NDL SAFETY ECLIPSE 18X1.5 (NEEDLE) IMPLANT
NEEDLE ASPIRATION 22 (NEEDLE) ×2 IMPLANT
NEEDLE HYPO 18GX1.5 SHARP (NEEDLE) ×4
PACK CYSTO (CUSTOM PROCEDURE TRAY) ×2 IMPLANT
SYR 10ML LL (SYRINGE) ×2 IMPLANT
SYR CONTROL 10ML LL (SYRINGE) IMPLANT
TUBE CONNECTING 12X1/4 (SUCTIONS) IMPLANT
WATER STERILE IRR 3000ML UROMA (IV SOLUTION) ×2 IMPLANT

## 2022-02-14 NOTE — Anesthesia Postprocedure Evaluation (Signed)
Anesthesia Post Note  Patient: Brendan Meyer  Procedure(s) Performed: CYSTOSCOPY (Bladder) BOTOX INJECTION (Bladder)     Patient location during evaluation: PACU Anesthesia Type: General Level of consciousness: awake and alert Pain management: pain level controlled Vital Signs Assessment: post-procedure vital signs reviewed and stable Respiratory status: spontaneous breathing, nonlabored ventilation, respiratory function stable and patient connected to nasal cannula oxygen Cardiovascular status: blood pressure returned to baseline and stable Postop Assessment: no apparent nausea or vomiting Anesthetic complications: no   No notable events documented.  Last Vitals:  Vitals:   02/14/22 0930 02/14/22 0945  BP: (!) 166/73 (!) 158/87  Pulse: 67 66  Resp: 15 (!) 7  Temp:    SpO2: 98% 100%    Last Pain:  Vitals:   02/14/22 0930  TempSrc:   PainSc: 0-No pain                 Tiajuana Amass

## 2022-02-14 NOTE — Anesthesia Preprocedure Evaluation (Signed)
Anesthesia Evaluation  Patient identified by MRN, date of birth, ID band Patient awake    Reviewed: Allergy & Precautions, NPO status , Patient's Chart, lab work & pertinent test results  Airway Mallampati: II  TM Distance: >3 FB Neck ROM: Full    Dental  (+) Dental Advisory Given   Pulmonary neg pulmonary ROS,    breath sounds clear to auscultation       Cardiovascular hypertension, Pt. on home beta blockers + CAD and + CABG  + dysrhythmias Atrial Fibrillation  Rhythm:Regular Rate:Normal     Neuro/Psych negative neurological ROS     GI/Hepatic Neg liver ROS, GERD  ,  Endo/Other  diabetes, Type 2  Renal/GU CRFRenal disease     Musculoskeletal   Abdominal   Peds  Hematology negative hematology ROS (+)   Anesthesia Other Findings   Reproductive/Obstetrics                             Anesthesia Physical Anesthesia Plan  ASA: 3  Anesthesia Plan: General   Post-op Pain Management: Tylenol PO (pre-op)* and Minimal or no pain anticipated   Induction: Intravenous  PONV Risk Score and Plan: 2 and Dexamethasone, Ondansetron and Treatment may vary due to age or medical condition  Airway Management Planned: LMA  Additional Equipment: None  Intra-op Plan:   Post-operative Plan: Extubation in OR  Informed Consent: I have reviewed the patients History and Physical, chart, labs and discussed the procedure including the risks, benefits and alternatives for the proposed anesthesia with the patient or authorized representative who has indicated his/her understanding and acceptance.     Dental advisory given  Plan Discussed with: CRNA  Anesthesia Plan Comments:         Anesthesia Quick Evaluation

## 2022-02-14 NOTE — Op Note (Signed)
Preoperative diagnosis:  Refractory overactive bladder  Postoperative diagnosis:  Same  Procedure: Cystoscopy, intravesical Botox injection, 150 units  Surgeon: Ardis Hughs, MD  Anesthesia: General  Complications: None  Intraoperative findings:  The patient's prostate was resected with minimal regrowth.  There was trabeculation within the bladder and some sedimented urine that was drained initially.  The bladder capacity seemed diminished. 150 units of Botox was injected in and along the trigone of the bladder  EBL: Minimal  Specimens: None  Indication: Brendan Meyer is a 87 y.o. patient with history of significant irritative voiding symptoms including urgency and incontinence who has failed medical management.  He was taken to the operating room 18 months ago for Botox instillation and did quite well for an extended period of time.  After reviewing the management options for treatment, he elected to proceed with the above surgical procedure(s). We have discussed the potential benefits and risks of the procedure, side effects of the proposed treatment, the likelihood of the patient achieving the goals of the procedure, and any potential problems that might occur during the procedure or recuperation. Informed consent has been obtained.  Description of procedure:  The patient was taken to the operating room and general anesthesia was induced.  The patient was placed in the dorsal lithotomy position, prepped and draped in the usual sterile fashion, and preoperative antibiotics were administered. A preoperative time-out was performed.   A 0 degree 21 French cystoscope was gently passed through the patient's urethra and into the bladder under visual guidance.  The bladder was emptied and then felt slowly inspecting the walls of the bladder and the anatomy.  It was as noted above.  In the trigonal region I then injected 150 units of Botox through 15 separate injection sites.   There is minimal bleeding.  The bladder subsequently emptied and the scope removed.  The patient was then awoken and returned to the PACU in stable condition.  Ardis Hughs, M.D.

## 2022-02-14 NOTE — Anesthesia Procedure Notes (Signed)
Procedure Name: LMA Insertion Date/Time: 02/14/2022 8:54 AM  Performed by: Bonney Aid, CRNAPre-anesthesia Checklist: Patient identified, Emergency Drugs available, Suction available and Patient being monitored Patient Re-evaluated:Patient Re-evaluated prior to induction Oxygen Delivery Method: Circle system utilized Preoxygenation: Pre-oxygenation with 100% oxygen Induction Type: IV induction Ventilation: Mask ventilation without difficulty LMA: LMA inserted LMA Size: 4.0 Number of attempts: 1 Placement Confirmation: positive ETCO2 Tube secured with: Tape Dental Injury: Teeth and Oropharynx as per pre-operative assessment

## 2022-02-14 NOTE — Discharge Instructions (Addendum)
CYSTOSCOPY HOME CARE INSTRUCTIONS  Activity: Rest for the remainder of the day.  Do not drive or operate equipment today.  You may resume normal activities in one to two days as instructed by your physician.   Meals: Drink plenty of liquids and eat light foods such as gelatin or soup this evening.  You may return to a normal meal plan tomorrow.  Return to Work: You may return to work in one to two days or as instructed by your physician.  Special Instructions / Symptoms: Call your physician if any of these symptoms occur:   -persistent or heavy bleeding  -bleeding which continues after first few urination  -large blood clots that are difficult to pass  -urine stream diminishes or stops completely  -fever equal to or higher than 101 degrees Farenheit.  -cloudy urine with a strong, foul odor  -severe pain   Post Anesthesia Home Care Instructions  Activity: Get plenty of rest for the remainder of the day. A responsible individual must stay with you for 24 hours following the procedure.  For the next 24 hours, DO NOT: -Drive a car -Paediatric nurse -Drink alcoholic beverages -Take any medication unless instructed by your physician -Make any legal decisions or sign important papers.  Meals: Start with liquid foods such as gelatin or soup. Progress to regular foods as tolerated. Avoid greasy, spicy, heavy foods. If nausea and/or vomiting occur, drink only clear liquids until the nausea and/or vomiting subsides. Call your physician if vomiting continues.  Special Instructions/Symptoms: Your throat may feel dry or sore from the anesthesia or the breathing tube placed in your throat during surgery. If this causes discomfort, gargle with warm salt water. The discomfort should disappear within 24 hours.  No acetaminophen/Tylenol until after 1:15 pm today if needed.

## 2022-02-14 NOTE — H&P (Signed)
87 year old male who is seen today for urgent evaluation due to worsening renal function. The patient was seen by his primary care provider last week and his creatinine was noted to be elevated at 2.2. I do not have any additional lab work to compare this to.   I have seen this patient before, I initially met him in 2015. At that time he was noted to have hydronephrosis. 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 He was lost to follow-up. He has subsequently been seen by Dr. Estill Dooms in [facility]. In January 2020 the patient had a Lasix renogram and demonstrated no obstruction of the left kidney. He then underwent a TURP. His voiding symptoms have worsened, he struggles now with urinary frequency and urgency. He has failed many different overactive bladder medications as well as posterior tibial nerve stimulation. He has been seen by Dr. board in an by Dr. Matilde Sprang for his voiding symptoms.   The patient has a past medical history significant for rectal cancer. This was diagnosed in treated over the past several years. He had the primary resection and then followed by radiation. Since the radiation the patient's urinary tract symptoms have been quite severe.   Looking in epic, on March 2020 the patient's BUN/creatinine were 27/1.28.  1/22: Today the patient follows up for further discussion and management of his current circumstance. He was taken to the operating room 2 weeks ago. At that time he was noted to have a small bladder capacity of approximately 100 mL. His bladder was very noncompliant and he had severe ureteral reflux almost immediately his right ureter. 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 As there is no other abnormality within the right collecting system. He was also noted to have severe cellules and trabecula I along the trigonal region as well as other aspects of the bladder. 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 The left ureter was never identified because of the anatomic abnormalities.   He 150 units of Botox was instilled into the trigonal region of the patient's bladder. A Foley catheter was left because of the hematuria. He followed up approximately 1 week ago and at the time was unable to void. A catheter was subsequently replaced.   3/22: He is now voiding without a catheter and his urinary frequency has gone from every 10 minutes every 2 hours. He is quite happy with the results of his Botox.   8/22: Today the patient presents 8 months after Botox injection. He continues to benefit from these results with his voiding frequency every 90 minutes. This is declined some, but for the most part is quite happy with his symptoms. Denies any hematuria. He denies any dysuria. He feels if he empties his bladder well.   12/22: The patient was holding steady, with no worsening of his lower urinary tract symptoms. We opted at that point not to perform any additional treatments.   Interval: Today the patient reports cloudy urine and increased urinary urgency and frequency. He has some mild incontinence. He finds that his symptoms have worsened since he was last here 6 months prior. He is not having any dysuria. He denies any gross hematuria. He has very frequent small voids.   02/05/2022: 87 year old man who presents today for preoperative appointment prior to undergoing bladder Botox. He has undergone this once before in January and did well. He endorses milky white-colored urine, however he does have  chronic kidney disease. He has had multiple negative cultures. He denies any changes to his health history. He tolerated the Botox well and is ready to proceed with the next installation.     ALLERGIES: No Allergies    MEDICATIONS: Aspirin Ec 81 mg tablet, delayed release Oral  Lantus Solostar 100 unit/ml (3 ml) insulin pen Subcutaneous  Metformin Hcl Er 500 mg tablet, extended  release 24 hr Oral  Metoprolol Tartrate 25 mg tablet Oral  Simvastatin 40 mg tablet Oral  Zolpidem Tartrate     GU PSH: Complex cystometrogram, w/ void pressure and urethral pressure profile studies, any technique - 2020 Complex Uroflow - 2020 Cystoscopy - 2020 Cystoscopy Ureteroscopy, Right - 2022, 2015 Cystourethroscopy, W/Injection For Chemodenervation Of Bladder - 2022 Emg surf Electrd - 2020 Inject For cystogram - 2020 Intrabd voidng Press - 2020 Locm 300-399Mg/Ml Iodine,1Ml - 2020       PSH Notes: Cystoscopy With Ureteroscopy Left, Hernia Repair, CABG (CABG), colon tumor removed (2018), heart bypass (2010)   NON-GU PSH: CABG (coronary artery bypass grafting) - 2015 Hernia Repair - 2015 Neuroeltrd Stim Post Tibial - 2020, 2020, 2020, 2020, 2020, 2020, 2020, 2020, 2020, 2020     GU PMH: BPH w/LUTS - 12/26/2021, - 02/11/2021, - 10/01/2020, - 2020 Urinary Frequency - 12/26/2021, - 06/13/2021, - 02/11/2021, - 10/01/2020, - 2022, - 06/26/2020 (Stable), - 2020, - 2020 Hydronephrosis - 10/01/2020, - 2022, - 2022, - 2022, - 06/26/2020, - 2020 Urinary Retention - 08/23/2020, - 2022, - 2020 Dysuria - 03/08/2020 Nocturia (Stable) - 2020, - 2020 Gross hematuria - 2020 Urinary Retention, Unspec - 2018, - 2018, Urinary retention, - 2015 Hydronephrosis Unspec, Hydronephrosis, left - 2015 Renal calculus, Nephrolithiasis - 2015      PMH Notes: h/o colon cancer   1) Hematuria: He presented to me in April 2020 and had recent gross hematuria. He has a history of pelvic radiation due to colon cancer. He denied tobacco use.   May 2020: CT imaging - Left hydronephrosis with urothelial thickening of proximal left ureter (reviewed Dr. Eskew's operative note from January 2020 indicating retrograde pyelogram that was normal without concerning urothelial lesions noted), Cystoscopy - His prostatic urethra was short measuring approximately 2-2.5 cm. There is poor visualization and his bladder is the urine was  quite cloudy. No definite tumors were identified.   2) LUTS: He has severe LUTS that developed after radiation to the pelvis for colon cancer. He developed retention after his radiation and had an indwelling catheter for about a year. He then underwent a TURP by Dr. Eskew. He has had severe LUTS including frequency and urgency since.   Current treatment: Indwelling catheter  Prior treatment: Myrbetriq (no effect), Toviaz (ineffective), Trospium (ineffective)   May-Jun 2020: Trial of voriconazole for funguria (no effect on LUTS), offered longer course and he refused due to cost and       NON-GU PMH: Pyuria/other UA findings - 2020 Colon Cancer, History Diabetes Type 2    FAMILY HISTORY: 1 Daughter - Daughter 1 son - Son cerebrovascular accident - Runs In Family Heart Attack - Mother   SOCIAL HISTORY: Marital Status: Married Preferred Language: English; Ethnicity: Not Hispanic Or Latino; Race: White Current Smoking Status: Patient has never smoked.   Tobacco Use Assessment Completed: Used Tobacco in last 30 days? Has never drank.  Drinks 1 caffeinated drink per day. Patient's occupation is/was Retired.     Notes: Never a smoker, Married, Mother deceased, Retired, Caffeine use, Number of children, Alcohol   use   REVIEW OF SYSTEMS:    GU Review Male:   Patient reports frequent urination, hard to postpone urination, and leakage of urine. Patient denies burning/ pain with urination, get up at night to urinate, stream starts and stops, trouble starting your stream, have to strain to urinate , erection problems, and penile pain.  Gastrointestinal (Upper):   Patient denies nausea, vomiting, and indigestion/ heartburn.  Gastrointestinal (Lower):   Patient denies diarrhea and constipation.  Constitutional:   Patient denies fever, night sweats, weight loss, and fatigue.  Skin:   Patient denies skin rash/ lesion and itching.  Musculoskeletal:   Patient denies back pain and joint pain.   Neurological:   Patient denies headaches and dizziness.  Psychologic:   Patient denies depression and anxiety.   VITAL SIGNS: None   MULTI-SYSTEM PHYSICAL EXAMINATION:    Constitutional: Well-nourished. No physical deformities. Normally developed. Good grooming.  Respiratory: No labored breathing, no use of accessory muscles.   Cardiovascular: Normal temperature, normal extremity pulses, no swelling, no varicosities.  Skin: No paleness, no jaundice, no cyanosis. No lesion, no ulcer, no rash.  Neurologic / Psychiatric: Oriented to time, oriented to place, oriented to person. No depression, no anxiety, no agitation.  Gastrointestinal: No mass, no tenderness, no rigidity, non obese abdomen.     Complexity of Data:  Source Of History:  Patient  Records Review:   Previous Doctor Records, Previous Patient Records  Urine Test Review:   Urinalysis   02/05/22  Urinalysis  Urine Appearance Cloudy   Urine Color Yellow   Urine Glucose 3+ mg/dL  Urine Bilirubin Neg mg/dL  Urine Ketones Neg mg/dL  Urine Specific Gravity 1.025   Urine Blood 2+ ery/uL  Urine pH 5.5   Urine Protein Trace mg/dL  Urine Urobilinogen 0.2 mg/dL  Urine Nitrites Neg   Urine Leukocyte Esterase 3+ leu/uL  Urine WBC/hpf >60/hpf   Urine RBC/hpf 3 - 10/hpf   Urine Epithelial Cells NS (Not Seen)   Urine Bacteria NS (Not Seen)   Urine Mucous Not Present   Urine Yeast NS (Not Seen)   Urine Trichomonas Not Present   Urine Cystals NS (Not Seen)   Urine Casts NS (Not Seen)   Urine Sperm Not Present    PROCEDURES:          Urinalysis w/Scope Dipstick Dipstick Cont'd Micro  Color: Yellow Bilirubin: Neg mg/dL WBC/hpf: >60/hpf  Appearance: Cloudy Ketones: Neg mg/dL RBC/hpf: 3 - 10/hpf  Specific Gravity: 1.025 Blood: 2+ ery/uL Bacteria: NS (Not Seen)  pH: 5.5 Protein: Trace mg/dL Cystals: NS (Not Seen)  Glucose: 3+ mg/dL Urobilinogen: 0.2 mg/dL Casts: NS (Not Seen)    Nitrites: Neg Trichomonas: Not Present     Leukocyte Esterase: 3+ leu/uL Mucous: Not Present      Epithelial Cells: NS (Not Seen)      Yeast: NS (Not Seen)      Sperm: Not Present    Notes: qns, unspun micro    ASSESSMENT:      ICD-10 Details  1 GU:   Urinary Frequency - R35.0 Chronic, Exacerbation   PLAN:           Orders Labs CULTURE, URINE          Document Letter(s):  Created for Patient: Clinical Summary         Notes:   Urine sent for culture today. Plan on treating according to culture results. He did not have any further questions regarding upcoming Botox procedure. He is cleared to   proceed as scheduled and will notify our clinic if he develops any changes to his medications or new allergies or changes to his health history.   

## 2022-02-14 NOTE — Transfer of Care (Signed)
Immediate Anesthesia Transfer of Care Note  Patient: Brendan Meyer  Procedure(s) Performed: CYSTOSCOPY (Bladder) BOTOX INJECTION (Bladder)  Patient Location: PACU  Anesthesia Type:General  Level of Consciousness: sedated  Airway & Oxygen Therapy: Patient Spontanous Breathing and Patient connected to nasal cannula oxygen  Post-op Assessment: Report given to RN  Post vital signs: Reviewed and stable  Last Vitals:  Vitals Value Taken Time  BP 166/74 02/14/22 0922  Temp 36.3 C 02/14/22 0923  Pulse 67 02/14/22 0925  Resp 13 02/14/22 0925  SpO2 98 % 02/14/22 0925  Vitals shown include unvalidated device data.  Last Pain:  Vitals:   02/14/22 0708  TempSrc: Oral  PainSc: 0-No pain      Patients Stated Pain Goal: 2 (16/74/25 5258)  Complications: No notable events documented.

## 2022-02-14 NOTE — Progress Notes (Signed)
Spoke with Dr. Louis Meckel regarding reconciliation of home medications and pending discharge. Per Dr. Louis Meckel, pt will not be discharged with any new prescriptions and is to continue all home medications as prior to surgery.

## 2022-02-14 NOTE — Interval H&P Note (Signed)
History and Physical Interval Note:  02/14/2022 8:51 AM  Brendan Meyer  has presented today for surgery, with the diagnosis of OVERACTIVE BLADDER.  The various methods of treatment have been discussed with the patient and family. After consideration of risks, benefits and other options for treatment, the patient has consented to  Procedure(s) with comments: CYSTOSCOPY (N/A) - 45 MINUTES NEEDED BOTOX INJECTION (N/A) as a surgical intervention.  The patient's history has been reviewed, patient examined, no change in status, stable for surgery.  I have reviewed the patient's chart and labs.  Questions were answered to the patient's satisfaction.     Ardis Hughs

## 2022-02-17 ENCOUNTER — Encounter (HOSPITAL_BASED_OUTPATIENT_CLINIC_OR_DEPARTMENT_OTHER): Payer: Self-pay | Admitting: Urology

## 2022-02-17 DIAGNOSIS — L570 Actinic keratosis: Secondary | ICD-10-CM | POA: Diagnosis not present

## 2022-02-17 DIAGNOSIS — L820 Inflamed seborrheic keratosis: Secondary | ICD-10-CM | POA: Diagnosis not present

## 2022-02-17 DIAGNOSIS — Z85828 Personal history of other malignant neoplasm of skin: Secondary | ICD-10-CM | POA: Diagnosis not present

## 2022-02-19 DIAGNOSIS — N390 Urinary tract infection, site not specified: Secondary | ICD-10-CM | POA: Diagnosis not present

## 2022-02-19 DIAGNOSIS — N1832 Chronic kidney disease, stage 3b: Secondary | ICD-10-CM | POA: Diagnosis not present

## 2022-02-19 DIAGNOSIS — N1370 Vesicoureteral-reflux, unspecified: Secondary | ICD-10-CM | POA: Diagnosis not present

## 2022-02-19 DIAGNOSIS — E1122 Type 2 diabetes mellitus with diabetic chronic kidney disease: Secondary | ICD-10-CM | POA: Diagnosis not present

## 2022-02-19 DIAGNOSIS — Z85048 Personal history of other malignant neoplasm of rectum, rectosigmoid junction, and anus: Secondary | ICD-10-CM | POA: Diagnosis not present

## 2022-02-28 DIAGNOSIS — R350 Frequency of micturition: Secondary | ICD-10-CM | POA: Diagnosis not present

## 2022-02-28 DIAGNOSIS — R8279 Other abnormal findings on microbiological examination of urine: Secondary | ICD-10-CM | POA: Diagnosis not present

## 2022-02-28 DIAGNOSIS — R351 Nocturia: Secondary | ICD-10-CM | POA: Diagnosis not present

## 2022-02-28 DIAGNOSIS — B3742 Candidal balanitis: Secondary | ICD-10-CM | POA: Diagnosis not present

## 2022-02-28 DIAGNOSIS — N401 Enlarged prostate with lower urinary tract symptoms: Secondary | ICD-10-CM | POA: Diagnosis not present

## 2022-03-31 DIAGNOSIS — R351 Nocturia: Secondary | ICD-10-CM | POA: Diagnosis not present

## 2022-03-31 DIAGNOSIS — N401 Enlarged prostate with lower urinary tract symptoms: Secondary | ICD-10-CM | POA: Diagnosis not present

## 2022-04-03 DIAGNOSIS — E1140 Type 2 diabetes mellitus with diabetic neuropathy, unspecified: Secondary | ICD-10-CM | POA: Diagnosis not present

## 2022-04-03 DIAGNOSIS — I2510 Atherosclerotic heart disease of native coronary artery without angina pectoris: Secondary | ICD-10-CM | POA: Diagnosis not present

## 2022-04-03 DIAGNOSIS — Z125 Encounter for screening for malignant neoplasm of prostate: Secondary | ICD-10-CM | POA: Diagnosis not present

## 2022-04-03 DIAGNOSIS — R7989 Other specified abnormal findings of blood chemistry: Secondary | ICD-10-CM | POA: Diagnosis not present

## 2022-04-03 DIAGNOSIS — E559 Vitamin D deficiency, unspecified: Secondary | ICD-10-CM | POA: Diagnosis not present

## 2022-04-03 DIAGNOSIS — E785 Hyperlipidemia, unspecified: Secondary | ICD-10-CM | POA: Diagnosis not present

## 2022-04-10 DIAGNOSIS — I700 Atherosclerosis of aorta: Secondary | ICD-10-CM | POA: Diagnosis not present

## 2022-04-10 DIAGNOSIS — Z0000 Encounter for general adult medical examination without abnormal findings: Secondary | ICD-10-CM | POA: Diagnosis not present

## 2022-04-10 DIAGNOSIS — Z85048 Personal history of other malignant neoplasm of rectum, rectosigmoid junction, and anus: Secondary | ICD-10-CM | POA: Diagnosis not present

## 2022-04-10 DIAGNOSIS — Z1339 Encounter for screening examination for other mental health and behavioral disorders: Secondary | ICD-10-CM | POA: Diagnosis not present

## 2022-04-10 DIAGNOSIS — Z1331 Encounter for screening for depression: Secondary | ICD-10-CM | POA: Diagnosis not present

## 2022-04-10 DIAGNOSIS — R251 Tremor, unspecified: Secondary | ICD-10-CM | POA: Diagnosis not present

## 2022-04-10 DIAGNOSIS — I2510 Atherosclerotic heart disease of native coronary artery without angina pectoris: Secondary | ICD-10-CM | POA: Diagnosis not present

## 2022-04-10 DIAGNOSIS — E785 Hyperlipidemia, unspecified: Secondary | ICD-10-CM | POA: Diagnosis not present

## 2022-04-10 DIAGNOSIS — I4891 Unspecified atrial fibrillation: Secondary | ICD-10-CM | POA: Diagnosis not present

## 2022-04-10 DIAGNOSIS — E1140 Type 2 diabetes mellitus with diabetic neuropathy, unspecified: Secondary | ICD-10-CM | POA: Diagnosis not present

## 2022-04-10 DIAGNOSIS — N1831 Chronic kidney disease, stage 3a: Secondary | ICD-10-CM | POA: Diagnosis not present

## 2022-04-10 DIAGNOSIS — E119 Type 2 diabetes mellitus without complications: Secondary | ICD-10-CM | POA: Diagnosis not present

## 2022-04-18 ENCOUNTER — Other Ambulatory Visit: Payer: Self-pay | Admitting: Surgery

## 2022-04-18 DIAGNOSIS — C20 Malignant neoplasm of rectum: Secondary | ICD-10-CM

## 2022-05-05 ENCOUNTER — Ambulatory Visit: Payer: Medicare HMO | Admitting: Podiatry

## 2022-05-05 ENCOUNTER — Encounter: Payer: Self-pay | Admitting: Podiatry

## 2022-05-05 DIAGNOSIS — M2042 Other hammer toe(s) (acquired), left foot: Secondary | ICD-10-CM

## 2022-05-05 DIAGNOSIS — M79674 Pain in right toe(s): Secondary | ICD-10-CM

## 2022-05-05 DIAGNOSIS — M79675 Pain in left toe(s): Secondary | ICD-10-CM | POA: Diagnosis not present

## 2022-05-05 DIAGNOSIS — M7752 Other enthesopathy of left foot: Secondary | ICD-10-CM

## 2022-05-05 DIAGNOSIS — B351 Tinea unguium: Secondary | ICD-10-CM

## 2022-05-05 DIAGNOSIS — M7751 Other enthesopathy of right foot: Secondary | ICD-10-CM | POA: Diagnosis not present

## 2022-05-05 MED ORDER — TRIAMCINOLONE ACETONIDE 10 MG/ML IJ SUSP
10.0000 mg | Freq: Once | INTRAMUSCULAR | Status: AC
  Administered 2022-05-05: 10 mg

## 2022-05-05 NOTE — Patient Instructions (Signed)

## 2022-05-06 NOTE — Progress Notes (Signed)
Subjective:   Patient ID: Brendan Meyer, male   DOB: 87 y.o.   MRN: [id number]   HPI Patient presents stating that he seems to be getting trouble between his fourth and fifth digits of his left foot and that is very sore and he is not sure what causes it.  States its been sore for several months and he also has nail disease 1-5 both feet that is hard for him to take care of.  Patient presents with caregiver does not smoke likes to be active   Review of Systems  All other systems reviewed and are negative.       Objective:  Physical Exam Vitals and nursing note reviewed.  Constitutional:      Appearance: He is well-developed.  Pulmonary:     Effort: Pulmonary effort is normal.  Musculoskeletal:        General: Normal range of motion.  Skin:    General: Skin is warm.  Neurological:     Mental Status: He is alert.     Neurovascular status intact muscle strength found to be adequate range of motion adequate.  Patient has a rotated fifth digit left foot with a thick nailbed that is pressing against the fourth digit with inflammation of the inner phalangeal joint digit 4 left that is moderately painful when pressed.  Patient is found to have good digital perfusion well-oriented x3 there is found to have thick toenails 1-5 both feet that do become painful     Assessment:  Inflammatory capsulitis fourth digit left foot with pain of the inner phalangeal joint and nail disease with pain 1-5 both feet     Plan:  H&P reviewed condition explained digital deformities and then also nail disease.  I went ahead did sterile prep I injected the inner phalangeal joint digit for left 2 mg dexamethasone Kenalog 5 mg Xylocaine I debrided nailbeds 1-5 both feet no angiogenic bleeding and patient will be seen back routine care may require more treatment for the toe with possible arthroplasty that I made him aware of.  Applied cushioning to the toe to take pressure off of it today

## 2022-06-12 DIAGNOSIS — E11621 Type 2 diabetes mellitus with foot ulcer: Secondary | ICD-10-CM | POA: Diagnosis not present

## 2022-06-12 DIAGNOSIS — E1140 Type 2 diabetes mellitus with diabetic neuropathy, unspecified: Secondary | ICD-10-CM | POA: Diagnosis not present

## 2022-06-12 DIAGNOSIS — L97521 Non-pressure chronic ulcer of other part of left foot limited to breakdown of skin: Secondary | ICD-10-CM | POA: Diagnosis not present

## 2022-06-12 DIAGNOSIS — M79675 Pain in left toe(s): Secondary | ICD-10-CM | POA: Diagnosis not present

## 2022-06-12 DIAGNOSIS — M79672 Pain in left foot: Secondary | ICD-10-CM | POA: Diagnosis not present

## 2022-06-12 DIAGNOSIS — M159 Polyosteoarthritis, unspecified: Secondary | ICD-10-CM | POA: Diagnosis not present

## 2022-06-16 ENCOUNTER — Ambulatory Visit: Payer: Medicare HMO | Admitting: Podiatry

## 2022-06-16 DIAGNOSIS — M2042 Other hammer toe(s) (acquired), left foot: Secondary | ICD-10-CM | POA: Diagnosis not present

## 2022-06-18 NOTE — Progress Notes (Signed)
Subjective:   Patient ID: Brendan Meyer, male   DOB: 87 y.o.   MRN: [id number]   HPI Patient states my toe is feeling some better it was aggravated again and then it seemed to get better.  Not sure what the long-term should be   ROS      Objective:  Physical Exam  Neurovascular status intact with keratotic lesion on the lateral side of the proximal phalanx digit 4 left localized to this area that is improved but is tender when I press deeply into the area     Assessment:  Digital deformity consistent with hammertoe fourth toe left foot moderate pain     Plan:  H&P reviewed condition recommended that we consider surgical removal of bone but at this point it does feel better again so we will use cushioning wider shoes.  If symptoms do come back again persist or get a have to consider surgical correction which I educated him on today

## 2022-07-09 DIAGNOSIS — E119 Type 2 diabetes mellitus without complications: Secondary | ICD-10-CM | POA: Diagnosis not present

## 2022-07-17 DIAGNOSIS — R350 Frequency of micturition: Secondary | ICD-10-CM | POA: Diagnosis not present

## 2022-07-17 DIAGNOSIS — N3944 Nocturnal enuresis: Secondary | ICD-10-CM | POA: Diagnosis not present

## 2022-07-17 DIAGNOSIS — N3942 Incontinence without sensory awareness: Secondary | ICD-10-CM | POA: Diagnosis not present

## 2022-08-04 DIAGNOSIS — L821 Other seborrheic keratosis: Secondary | ICD-10-CM | POA: Diagnosis not present

## 2022-08-04 DIAGNOSIS — L570 Actinic keratosis: Secondary | ICD-10-CM | POA: Diagnosis not present

## 2022-08-04 DIAGNOSIS — Z86008 Personal history of in-situ neoplasm of other site: Secondary | ICD-10-CM | POA: Diagnosis not present

## 2022-08-04 DIAGNOSIS — Z129 Encounter for screening for malignant neoplasm, site unspecified: Secondary | ICD-10-CM | POA: Diagnosis not present

## 2022-08-04 DIAGNOSIS — L853 Xerosis cutis: Secondary | ICD-10-CM | POA: Diagnosis not present

## 2022-08-04 DIAGNOSIS — Z85828 Personal history of other malignant neoplasm of skin: Secondary | ICD-10-CM | POA: Diagnosis not present

## 2022-08-14 DIAGNOSIS — L97521 Non-pressure chronic ulcer of other part of left foot limited to breakdown of skin: Secondary | ICD-10-CM | POA: Diagnosis not present

## 2022-08-14 DIAGNOSIS — I4891 Unspecified atrial fibrillation: Secondary | ICD-10-CM | POA: Diagnosis not present

## 2022-08-14 DIAGNOSIS — N401 Enlarged prostate with lower urinary tract symptoms: Secondary | ICD-10-CM | POA: Diagnosis not present

## 2022-08-14 DIAGNOSIS — I5189 Other ill-defined heart diseases: Secondary | ICD-10-CM | POA: Diagnosis not present

## 2022-08-14 DIAGNOSIS — I2510 Atherosclerotic heart disease of native coronary artery without angina pectoris: Secondary | ICD-10-CM | POA: Diagnosis not present

## 2022-08-14 DIAGNOSIS — E785 Hyperlipidemia, unspecified: Secondary | ICD-10-CM | POA: Diagnosis not present

## 2022-08-14 DIAGNOSIS — E11621 Type 2 diabetes mellitus with foot ulcer: Secondary | ICD-10-CM | POA: Diagnosis not present

## 2022-08-14 DIAGNOSIS — E1140 Type 2 diabetes mellitus with diabetic neuropathy, unspecified: Secondary | ICD-10-CM | POA: Diagnosis not present

## 2022-08-14 DIAGNOSIS — N1831 Chronic kidney disease, stage 3a: Secondary | ICD-10-CM | POA: Diagnosis not present

## 2022-08-14 DIAGNOSIS — I700 Atherosclerosis of aorta: Secondary | ICD-10-CM | POA: Diagnosis not present

## 2022-08-19 DIAGNOSIS — N3942 Incontinence without sensory awareness: Secondary | ICD-10-CM | POA: Diagnosis not present

## 2022-08-19 DIAGNOSIS — N401 Enlarged prostate with lower urinary tract symptoms: Secondary | ICD-10-CM | POA: Diagnosis not present

## 2022-08-26 DIAGNOSIS — Z85048 Personal history of other malignant neoplasm of rectum, rectosigmoid junction, and anus: Secondary | ICD-10-CM | POA: Diagnosis not present

## 2022-08-26 DIAGNOSIS — N1831 Chronic kidney disease, stage 3a: Secondary | ICD-10-CM | POA: Diagnosis not present

## 2022-08-26 DIAGNOSIS — I5189 Other ill-defined heart diseases: Secondary | ICD-10-CM | POA: Diagnosis not present

## 2022-08-26 DIAGNOSIS — E1140 Type 2 diabetes mellitus with diabetic neuropathy, unspecified: Secondary | ICD-10-CM | POA: Diagnosis not present

## 2022-08-26 DIAGNOSIS — I2510 Atherosclerotic heart disease of native coronary artery without angina pectoris: Secondary | ICD-10-CM | POA: Diagnosis not present

## 2022-08-26 DIAGNOSIS — N1832 Chronic kidney disease, stage 3b: Secondary | ICD-10-CM | POA: Diagnosis not present

## 2022-08-26 DIAGNOSIS — E1122 Type 2 diabetes mellitus with diabetic chronic kidney disease: Secondary | ICD-10-CM | POA: Diagnosis not present

## 2022-08-26 DIAGNOSIS — N1370 Vesicoureteral-reflux, unspecified: Secondary | ICD-10-CM | POA: Diagnosis not present

## 2022-09-19 DIAGNOSIS — N3942 Incontinence without sensory awareness: Secondary | ICD-10-CM | POA: Diagnosis not present

## 2022-10-07 DIAGNOSIS — Z794 Long term (current) use of insulin: Secondary | ICD-10-CM | POA: Diagnosis not present

## 2022-10-07 DIAGNOSIS — E119 Type 2 diabetes mellitus without complications: Secondary | ICD-10-CM | POA: Diagnosis not present

## 2022-10-07 DIAGNOSIS — I5189 Other ill-defined heart diseases: Secondary | ICD-10-CM | POA: Diagnosis not present

## 2022-10-07 DIAGNOSIS — I2510 Atherosclerotic heart disease of native coronary artery without angina pectoris: Secondary | ICD-10-CM | POA: Diagnosis not present

## 2022-10-07 DIAGNOSIS — E1140 Type 2 diabetes mellitus with diabetic neuropathy, unspecified: Secondary | ICD-10-CM | POA: Diagnosis not present

## 2022-10-07 DIAGNOSIS — N1831 Chronic kidney disease, stage 3a: Secondary | ICD-10-CM | POA: Diagnosis not present

## 2022-10-27 DIAGNOSIS — R338 Other retention of urine: Secondary | ICD-10-CM | POA: Diagnosis not present

## 2022-11-28 DIAGNOSIS — R338 Other retention of urine: Secondary | ICD-10-CM | POA: Diagnosis not present

## 2023-01-02 DIAGNOSIS — R338 Other retention of urine: Secondary | ICD-10-CM | POA: Diagnosis not present

## 2023-01-05 DIAGNOSIS — E119 Type 2 diabetes mellitus without complications: Secondary | ICD-10-CM | POA: Diagnosis not present

## 2023-01-14 DIAGNOSIS — R630 Anorexia: Secondary | ICD-10-CM | POA: Diagnosis not present

## 2023-01-14 DIAGNOSIS — F5104 Psychophysiologic insomnia: Secondary | ICD-10-CM | POA: Diagnosis not present

## 2023-01-14 DIAGNOSIS — R2689 Other abnormalities of gait and mobility: Secondary | ICD-10-CM | POA: Diagnosis not present

## 2023-01-14 DIAGNOSIS — M159 Polyosteoarthritis, unspecified: Secondary | ICD-10-CM | POA: Diagnosis not present

## 2023-01-14 DIAGNOSIS — N401 Enlarged prostate with lower urinary tract symptoms: Secondary | ICD-10-CM | POA: Diagnosis not present

## 2023-01-14 DIAGNOSIS — G894 Chronic pain syndrome: Secondary | ICD-10-CM | POA: Diagnosis not present

## 2023-01-14 DIAGNOSIS — F331 Major depressive disorder, recurrent, moderate: Secondary | ICD-10-CM | POA: Diagnosis not present

## 2023-01-20 DIAGNOSIS — I5189 Other ill-defined heart diseases: Secondary | ICD-10-CM | POA: Diagnosis not present

## 2023-01-20 DIAGNOSIS — E1140 Type 2 diabetes mellitus with diabetic neuropathy, unspecified: Secondary | ICD-10-CM | POA: Diagnosis not present

## 2023-01-20 DIAGNOSIS — N1831 Chronic kidney disease, stage 3a: Secondary | ICD-10-CM | POA: Diagnosis not present

## 2023-01-20 DIAGNOSIS — I2510 Atherosclerotic heart disease of native coronary artery without angina pectoris: Secondary | ICD-10-CM | POA: Diagnosis not present

## 2023-01-20 DIAGNOSIS — Z794 Long term (current) use of insulin: Secondary | ICD-10-CM | POA: Diagnosis not present

## 2023-01-20 DIAGNOSIS — R630 Anorexia: Secondary | ICD-10-CM | POA: Diagnosis not present

## 2023-01-22 DIAGNOSIS — Z961 Presence of intraocular lens: Secondary | ICD-10-CM | POA: Diagnosis not present

## 2023-01-22 DIAGNOSIS — H26492 Other secondary cataract, left eye: Secondary | ICD-10-CM | POA: Diagnosis not present

## 2023-01-22 DIAGNOSIS — H401132 Primary open-angle glaucoma, bilateral, moderate stage: Secondary | ICD-10-CM | POA: Diagnosis not present

## 2023-01-22 DIAGNOSIS — Z7984 Long term (current) use of oral hypoglycemic drugs: Secondary | ICD-10-CM | POA: Diagnosis not present

## 2023-02-03 DIAGNOSIS — R338 Other retention of urine: Secondary | ICD-10-CM | POA: Diagnosis not present

## 2023-02-24 DIAGNOSIS — N1370 Vesicoureteral-reflux, unspecified: Secondary | ICD-10-CM | POA: Diagnosis not present

## 2023-02-24 DIAGNOSIS — Z85048 Personal history of other malignant neoplasm of rectum, rectosigmoid junction, and anus: Secondary | ICD-10-CM | POA: Diagnosis not present

## 2023-02-24 DIAGNOSIS — E1122 Type 2 diabetes mellitus with diabetic chronic kidney disease: Secondary | ICD-10-CM | POA: Diagnosis not present

## 2023-02-24 DIAGNOSIS — N1832 Chronic kidney disease, stage 3b: Secondary | ICD-10-CM | POA: Diagnosis not present

## 2023-02-25 DIAGNOSIS — L821 Other seborrheic keratosis: Secondary | ICD-10-CM | POA: Diagnosis not present

## 2023-02-25 DIAGNOSIS — C4441 Basal cell carcinoma of skin of scalp and neck: Secondary | ICD-10-CM | POA: Diagnosis not present

## 2023-02-25 DIAGNOSIS — C44329 Squamous cell carcinoma of skin of other parts of face: Secondary | ICD-10-CM | POA: Diagnosis not present

## 2023-02-25 DIAGNOSIS — L570 Actinic keratosis: Secondary | ICD-10-CM | POA: Diagnosis not present

## 2023-02-25 DIAGNOSIS — D485 Neoplasm of uncertain behavior of skin: Secondary | ICD-10-CM | POA: Diagnosis not present

## 2023-03-10 DIAGNOSIS — R338 Other retention of urine: Secondary | ICD-10-CM | POA: Diagnosis not present

## 2023-03-11 DIAGNOSIS — L089 Local infection of the skin and subcutaneous tissue, unspecified: Secondary | ICD-10-CM | POA: Diagnosis not present

## 2023-03-30 DIAGNOSIS — C44329 Squamous cell carcinoma of skin of other parts of face: Secondary | ICD-10-CM | POA: Diagnosis not present

## 2023-04-05 DIAGNOSIS — E119 Type 2 diabetes mellitus without complications: Secondary | ICD-10-CM | POA: Diagnosis not present

## 2023-04-06 DIAGNOSIS — L905 Scar conditions and fibrosis of skin: Secondary | ICD-10-CM | POA: Diagnosis not present

## 2023-04-07 DIAGNOSIS — N401 Enlarged prostate with lower urinary tract symptoms: Secondary | ICD-10-CM | POA: Diagnosis not present

## 2023-04-07 DIAGNOSIS — N1330 Unspecified hydronephrosis: Secondary | ICD-10-CM | POA: Diagnosis not present

## 2023-04-07 DIAGNOSIS — N3942 Incontinence without sensory awareness: Secondary | ICD-10-CM | POA: Diagnosis not present

## 2023-04-10 DIAGNOSIS — E785 Hyperlipidemia, unspecified: Secondary | ICD-10-CM | POA: Diagnosis not present

## 2023-04-10 DIAGNOSIS — Z1389 Encounter for screening for other disorder: Secondary | ICD-10-CM | POA: Diagnosis not present

## 2023-04-10 DIAGNOSIS — E559 Vitamin D deficiency, unspecified: Secondary | ICD-10-CM | POA: Diagnosis not present

## 2023-04-10 DIAGNOSIS — E1140 Type 2 diabetes mellitus with diabetic neuropathy, unspecified: Secondary | ICD-10-CM | POA: Diagnosis not present

## 2023-04-10 DIAGNOSIS — D649 Anemia, unspecified: Secondary | ICD-10-CM | POA: Diagnosis not present

## 2023-04-17 DIAGNOSIS — Z1339 Encounter for screening examination for other mental health and behavioral disorders: Secondary | ICD-10-CM | POA: Diagnosis not present

## 2023-04-17 DIAGNOSIS — Z0000 Encounter for general adult medical examination without abnormal findings: Secondary | ICD-10-CM | POA: Diagnosis not present

## 2023-04-17 DIAGNOSIS — I700 Atherosclerosis of aorta: Secondary | ICD-10-CM | POA: Diagnosis not present

## 2023-04-17 DIAGNOSIS — Z794 Long term (current) use of insulin: Secondary | ICD-10-CM | POA: Diagnosis not present

## 2023-04-17 DIAGNOSIS — N1831 Chronic kidney disease, stage 3a: Secondary | ICD-10-CM | POA: Diagnosis not present

## 2023-04-17 DIAGNOSIS — I2510 Atherosclerotic heart disease of native coronary artery without angina pectoris: Secondary | ICD-10-CM | POA: Diagnosis not present

## 2023-04-17 DIAGNOSIS — E1140 Type 2 diabetes mellitus with diabetic neuropathy, unspecified: Secondary | ICD-10-CM | POA: Diagnosis not present

## 2023-04-17 DIAGNOSIS — E039 Hypothyroidism, unspecified: Secondary | ICD-10-CM | POA: Diagnosis not present

## 2023-04-17 DIAGNOSIS — D649 Anemia, unspecified: Secondary | ICD-10-CM | POA: Diagnosis not present

## 2023-04-17 DIAGNOSIS — E785 Hyperlipidemia, unspecified: Secondary | ICD-10-CM | POA: Diagnosis not present

## 2023-04-17 DIAGNOSIS — Z1331 Encounter for screening for depression: Secondary | ICD-10-CM | POA: Diagnosis not present

## 2023-05-12 DIAGNOSIS — R338 Other retention of urine: Secondary | ICD-10-CM | POA: Diagnosis not present

## 2023-06-10 DIAGNOSIS — R338 Other retention of urine: Secondary | ICD-10-CM | POA: Diagnosis not present

## 2023-06-23 DIAGNOSIS — C4441 Basal cell carcinoma of skin of scalp and neck: Secondary | ICD-10-CM | POA: Diagnosis not present

## 2023-07-04 DIAGNOSIS — E119 Type 2 diabetes mellitus without complications: Secondary | ICD-10-CM | POA: Diagnosis not present

## 2023-07-14 DIAGNOSIS — R338 Other retention of urine: Secondary | ICD-10-CM | POA: Diagnosis not present

## 2023-07-20 DIAGNOSIS — M533 Sacrococcygeal disorders, not elsewhere classified: Secondary | ICD-10-CM | POA: Diagnosis not present

## 2023-07-21 DIAGNOSIS — N1831 Chronic kidney disease, stage 3a: Secondary | ICD-10-CM | POA: Diagnosis not present

## 2023-07-21 DIAGNOSIS — I2510 Atherosclerotic heart disease of native coronary artery without angina pectoris: Secondary | ICD-10-CM | POA: Diagnosis not present

## 2023-07-21 DIAGNOSIS — E1140 Type 2 diabetes mellitus with diabetic neuropathy, unspecified: Secondary | ICD-10-CM | POA: Diagnosis not present

## 2023-07-21 DIAGNOSIS — Z794 Long term (current) use of insulin: Secondary | ICD-10-CM | POA: Diagnosis not present

## 2023-07-22 DIAGNOSIS — M533 Sacrococcygeal disorders, not elsewhere classified: Secondary | ICD-10-CM | POA: Diagnosis not present

## 2023-07-22 DIAGNOSIS — R531 Weakness: Secondary | ICD-10-CM | POA: Diagnosis not present

## 2023-07-22 DIAGNOSIS — R269 Unspecified abnormalities of gait and mobility: Secondary | ICD-10-CM | POA: Diagnosis not present

## 2023-07-24 DIAGNOSIS — R269 Unspecified abnormalities of gait and mobility: Secondary | ICD-10-CM | POA: Diagnosis not present

## 2023-07-24 DIAGNOSIS — R531 Weakness: Secondary | ICD-10-CM | POA: Diagnosis not present

## 2023-07-24 DIAGNOSIS — M533 Sacrococcygeal disorders, not elsewhere classified: Secondary | ICD-10-CM | POA: Diagnosis not present

## 2023-07-27 DIAGNOSIS — M533 Sacrococcygeal disorders, not elsewhere classified: Secondary | ICD-10-CM | POA: Diagnosis not present

## 2023-07-27 DIAGNOSIS — R269 Unspecified abnormalities of gait and mobility: Secondary | ICD-10-CM | POA: Diagnosis not present

## 2023-07-27 DIAGNOSIS — R531 Weakness: Secondary | ICD-10-CM | POA: Diagnosis not present

## 2023-07-29 DIAGNOSIS — M533 Sacrococcygeal disorders, not elsewhere classified: Secondary | ICD-10-CM | POA: Diagnosis not present

## 2023-07-29 DIAGNOSIS — R531 Weakness: Secondary | ICD-10-CM | POA: Diagnosis not present

## 2023-07-29 DIAGNOSIS — R269 Unspecified abnormalities of gait and mobility: Secondary | ICD-10-CM | POA: Diagnosis not present

## 2023-07-31 DIAGNOSIS — R531 Weakness: Secondary | ICD-10-CM | POA: Diagnosis not present

## 2023-07-31 DIAGNOSIS — M533 Sacrococcygeal disorders, not elsewhere classified: Secondary | ICD-10-CM | POA: Diagnosis not present

## 2023-07-31 DIAGNOSIS — R269 Unspecified abnormalities of gait and mobility: Secondary | ICD-10-CM | POA: Diagnosis not present

## 2023-08-11 DIAGNOSIS — Z85048 Personal history of other malignant neoplasm of rectum, rectosigmoid junction, and anus: Secondary | ICD-10-CM | POA: Diagnosis not present

## 2023-08-11 DIAGNOSIS — I4891 Unspecified atrial fibrillation: Secondary | ICD-10-CM | POA: Diagnosis not present

## 2023-08-11 DIAGNOSIS — E1140 Type 2 diabetes mellitus with diabetic neuropathy, unspecified: Secondary | ICD-10-CM | POA: Diagnosis not present

## 2023-08-11 DIAGNOSIS — R634 Abnormal weight loss: Secondary | ICD-10-CM | POA: Diagnosis not present

## 2023-08-11 DIAGNOSIS — E559 Vitamin D deficiency, unspecified: Secondary | ICD-10-CM | POA: Diagnosis not present

## 2023-08-11 DIAGNOSIS — D649 Anemia, unspecified: Secondary | ICD-10-CM | POA: Diagnosis not present

## 2023-08-11 DIAGNOSIS — N401 Enlarged prostate with lower urinary tract symptoms: Secondary | ICD-10-CM | POA: Diagnosis not present

## 2023-08-11 DIAGNOSIS — N1831 Chronic kidney disease, stage 3a: Secondary | ICD-10-CM | POA: Diagnosis not present

## 2023-08-11 DIAGNOSIS — E039 Hypothyroidism, unspecified: Secondary | ICD-10-CM | POA: Diagnosis not present

## 2023-08-18 DIAGNOSIS — R338 Other retention of urine: Secondary | ICD-10-CM | POA: Diagnosis not present

## 2023-09-02 DIAGNOSIS — K5641 Fecal impaction: Secondary | ICD-10-CM | POA: Diagnosis not present

## 2023-09-02 DIAGNOSIS — K5909 Other constipation: Secondary | ICD-10-CM | POA: Diagnosis not present

## 2023-09-02 DIAGNOSIS — Z85048 Personal history of other malignant neoplasm of rectum, rectosigmoid junction, and anus: Secondary | ICD-10-CM | POA: Diagnosis not present

## 2023-09-02 DIAGNOSIS — N401 Enlarged prostate with lower urinary tract symptoms: Secondary | ICD-10-CM | POA: Diagnosis not present

## 2023-09-02 DIAGNOSIS — K6289 Other specified diseases of anus and rectum: Secondary | ICD-10-CM | POA: Diagnosis not present

## 2023-09-08 DIAGNOSIS — N1831 Chronic kidney disease, stage 3a: Secondary | ICD-10-CM | POA: Diagnosis not present

## 2023-09-08 DIAGNOSIS — D649 Anemia, unspecified: Secondary | ICD-10-CM | POA: Diagnosis not present

## 2023-09-08 DIAGNOSIS — Z794 Long term (current) use of insulin: Secondary | ICD-10-CM | POA: Diagnosis not present

## 2023-09-08 DIAGNOSIS — E11621 Type 2 diabetes mellitus with foot ulcer: Secondary | ICD-10-CM | POA: Diagnosis not present

## 2023-09-08 DIAGNOSIS — E1140 Type 2 diabetes mellitus with diabetic neuropathy, unspecified: Secondary | ICD-10-CM | POA: Diagnosis not present

## 2023-09-08 DIAGNOSIS — I4891 Unspecified atrial fibrillation: Secondary | ICD-10-CM | POA: Diagnosis not present

## 2023-09-08 DIAGNOSIS — L97521 Non-pressure chronic ulcer of other part of left foot limited to breakdown of skin: Secondary | ICD-10-CM | POA: Diagnosis not present

## 2023-09-08 DIAGNOSIS — R634 Abnormal weight loss: Secondary | ICD-10-CM | POA: Diagnosis not present

## 2023-09-08 DIAGNOSIS — F331 Major depressive disorder, recurrent, moderate: Secondary | ICD-10-CM | POA: Diagnosis not present

## 2023-09-14 DIAGNOSIS — E119 Type 2 diabetes mellitus without complications: Secondary | ICD-10-CM | POA: Diagnosis not present

## 2023-09-15 DIAGNOSIS — R338 Other retention of urine: Secondary | ICD-10-CM | POA: Diagnosis not present

## 2023-09-21 DIAGNOSIS — D631 Anemia in chronic kidney disease: Secondary | ICD-10-CM | POA: Diagnosis not present

## 2023-09-21 DIAGNOSIS — I700 Atherosclerosis of aorta: Secondary | ICD-10-CM | POA: Diagnosis not present

## 2023-09-21 DIAGNOSIS — Z794 Long term (current) use of insulin: Secondary | ICD-10-CM | POA: Diagnosis not present

## 2023-09-21 DIAGNOSIS — I4891 Unspecified atrial fibrillation: Secondary | ICD-10-CM | POA: Diagnosis not present

## 2023-09-21 DIAGNOSIS — E1122 Type 2 diabetes mellitus with diabetic chronic kidney disease: Secondary | ICD-10-CM | POA: Diagnosis not present

## 2023-09-21 DIAGNOSIS — E1140 Type 2 diabetes mellitus with diabetic neuropathy, unspecified: Secondary | ICD-10-CM | POA: Diagnosis not present

## 2023-09-21 DIAGNOSIS — F331 Major depressive disorder, recurrent, moderate: Secondary | ICD-10-CM | POA: Diagnosis not present

## 2023-09-21 DIAGNOSIS — N1831 Chronic kidney disease, stage 3a: Secondary | ICD-10-CM | POA: Diagnosis not present

## 2023-09-21 DIAGNOSIS — N400 Enlarged prostate without lower urinary tract symptoms: Secondary | ICD-10-CM | POA: Diagnosis not present

## 2023-09-25 DIAGNOSIS — E1140 Type 2 diabetes mellitus with diabetic neuropathy, unspecified: Secondary | ICD-10-CM | POA: Diagnosis not present

## 2023-09-25 DIAGNOSIS — N1831 Chronic kidney disease, stage 3a: Secondary | ICD-10-CM | POA: Diagnosis not present

## 2023-09-25 DIAGNOSIS — I4891 Unspecified atrial fibrillation: Secondary | ICD-10-CM | POA: Diagnosis not present

## 2023-09-25 DIAGNOSIS — D631 Anemia in chronic kidney disease: Secondary | ICD-10-CM | POA: Diagnosis not present

## 2023-09-25 DIAGNOSIS — Z794 Long term (current) use of insulin: Secondary | ICD-10-CM | POA: Diagnosis not present

## 2023-09-25 DIAGNOSIS — F331 Major depressive disorder, recurrent, moderate: Secondary | ICD-10-CM | POA: Diagnosis not present

## 2023-09-25 DIAGNOSIS — I700 Atherosclerosis of aorta: Secondary | ICD-10-CM | POA: Diagnosis not present

## 2023-09-25 DIAGNOSIS — N400 Enlarged prostate without lower urinary tract symptoms: Secondary | ICD-10-CM | POA: Diagnosis not present

## 2023-09-25 DIAGNOSIS — E1122 Type 2 diabetes mellitus with diabetic chronic kidney disease: Secondary | ICD-10-CM | POA: Diagnosis not present

## 2023-09-29 DIAGNOSIS — E1140 Type 2 diabetes mellitus with diabetic neuropathy, unspecified: Secondary | ICD-10-CM | POA: Diagnosis not present

## 2023-09-29 DIAGNOSIS — I700 Atherosclerosis of aorta: Secondary | ICD-10-CM | POA: Diagnosis not present

## 2023-09-29 DIAGNOSIS — N1831 Chronic kidney disease, stage 3a: Secondary | ICD-10-CM | POA: Diagnosis not present

## 2023-09-29 DIAGNOSIS — D631 Anemia in chronic kidney disease: Secondary | ICD-10-CM | POA: Diagnosis not present

## 2023-09-29 DIAGNOSIS — E1122 Type 2 diabetes mellitus with diabetic chronic kidney disease: Secondary | ICD-10-CM | POA: Diagnosis not present

## 2023-09-29 DIAGNOSIS — E039 Hypothyroidism, unspecified: Secondary | ICD-10-CM | POA: Diagnosis not present

## 2023-09-29 DIAGNOSIS — I4891 Unspecified atrial fibrillation: Secondary | ICD-10-CM | POA: Diagnosis not present

## 2023-09-29 DIAGNOSIS — F331 Major depressive disorder, recurrent, moderate: Secondary | ICD-10-CM | POA: Diagnosis not present

## 2023-09-30 DIAGNOSIS — I4891 Unspecified atrial fibrillation: Secondary | ICD-10-CM | POA: Diagnosis not present

## 2023-09-30 DIAGNOSIS — N400 Enlarged prostate without lower urinary tract symptoms: Secondary | ICD-10-CM | POA: Diagnosis not present

## 2023-09-30 DIAGNOSIS — Z794 Long term (current) use of insulin: Secondary | ICD-10-CM | POA: Diagnosis not present

## 2023-09-30 DIAGNOSIS — F331 Major depressive disorder, recurrent, moderate: Secondary | ICD-10-CM | POA: Diagnosis not present

## 2023-09-30 DIAGNOSIS — I700 Atherosclerosis of aorta: Secondary | ICD-10-CM | POA: Diagnosis not present

## 2023-09-30 DIAGNOSIS — E1122 Type 2 diabetes mellitus with diabetic chronic kidney disease: Secondary | ICD-10-CM | POA: Diagnosis not present

## 2023-09-30 DIAGNOSIS — E1140 Type 2 diabetes mellitus with diabetic neuropathy, unspecified: Secondary | ICD-10-CM | POA: Diagnosis not present

## 2023-09-30 DIAGNOSIS — D631 Anemia in chronic kidney disease: Secondary | ICD-10-CM | POA: Diagnosis not present

## 2023-09-30 DIAGNOSIS — N1831 Chronic kidney disease, stage 3a: Secondary | ICD-10-CM | POA: Diagnosis not present

## 2023-10-01 DIAGNOSIS — N1370 Vesicoureteral-reflux, unspecified: Secondary | ICD-10-CM | POA: Diagnosis not present

## 2023-10-01 DIAGNOSIS — Z85048 Personal history of other malignant neoplasm of rectum, rectosigmoid junction, and anus: Secondary | ICD-10-CM | POA: Diagnosis not present

## 2023-10-01 DIAGNOSIS — E1122 Type 2 diabetes mellitus with diabetic chronic kidney disease: Secondary | ICD-10-CM | POA: Diagnosis not present

## 2023-10-01 DIAGNOSIS — N1832 Chronic kidney disease, stage 3b: Secondary | ICD-10-CM | POA: Diagnosis not present

## 2023-10-02 DIAGNOSIS — N400 Enlarged prostate without lower urinary tract symptoms: Secondary | ICD-10-CM | POA: Diagnosis not present

## 2023-10-02 DIAGNOSIS — I4891 Unspecified atrial fibrillation: Secondary | ICD-10-CM | POA: Diagnosis not present

## 2023-10-02 DIAGNOSIS — I700 Atherosclerosis of aorta: Secondary | ICD-10-CM | POA: Diagnosis not present

## 2023-10-02 DIAGNOSIS — E1140 Type 2 diabetes mellitus with diabetic neuropathy, unspecified: Secondary | ICD-10-CM | POA: Diagnosis not present

## 2023-10-02 DIAGNOSIS — E1122 Type 2 diabetes mellitus with diabetic chronic kidney disease: Secondary | ICD-10-CM | POA: Diagnosis not present

## 2023-10-02 DIAGNOSIS — F331 Major depressive disorder, recurrent, moderate: Secondary | ICD-10-CM | POA: Diagnosis not present

## 2023-10-02 DIAGNOSIS — N1831 Chronic kidney disease, stage 3a: Secondary | ICD-10-CM | POA: Diagnosis not present

## 2023-10-02 DIAGNOSIS — Z794 Long term (current) use of insulin: Secondary | ICD-10-CM | POA: Diagnosis not present

## 2023-10-02 DIAGNOSIS — D631 Anemia in chronic kidney disease: Secondary | ICD-10-CM | POA: Diagnosis not present

## 2023-10-08 DIAGNOSIS — E1140 Type 2 diabetes mellitus with diabetic neuropathy, unspecified: Secondary | ICD-10-CM | POA: Diagnosis not present

## 2023-10-08 DIAGNOSIS — D631 Anemia in chronic kidney disease: Secondary | ICD-10-CM | POA: Diagnosis not present

## 2023-10-08 DIAGNOSIS — N1831 Chronic kidney disease, stage 3a: Secondary | ICD-10-CM | POA: Diagnosis not present

## 2023-10-08 DIAGNOSIS — I700 Atherosclerosis of aorta: Secondary | ICD-10-CM | POA: Diagnosis not present

## 2023-10-08 DIAGNOSIS — F331 Major depressive disorder, recurrent, moderate: Secondary | ICD-10-CM | POA: Diagnosis not present

## 2023-10-08 DIAGNOSIS — I4891 Unspecified atrial fibrillation: Secondary | ICD-10-CM | POA: Diagnosis not present

## 2023-10-08 DIAGNOSIS — N400 Enlarged prostate without lower urinary tract symptoms: Secondary | ICD-10-CM | POA: Diagnosis not present

## 2023-10-08 DIAGNOSIS — Z794 Long term (current) use of insulin: Secondary | ICD-10-CM | POA: Diagnosis not present

## 2023-10-08 DIAGNOSIS — E1122 Type 2 diabetes mellitus with diabetic chronic kidney disease: Secondary | ICD-10-CM | POA: Diagnosis not present

## 2023-10-13 DIAGNOSIS — R338 Other retention of urine: Secondary | ICD-10-CM | POA: Diagnosis not present

## 2023-10-14 DIAGNOSIS — L820 Inflamed seborrheic keratosis: Secondary | ICD-10-CM | POA: Diagnosis not present

## 2023-10-14 DIAGNOSIS — C44329 Squamous cell carcinoma of skin of other parts of face: Secondary | ICD-10-CM | POA: Diagnosis not present

## 2023-10-14 DIAGNOSIS — L570 Actinic keratosis: Secondary | ICD-10-CM | POA: Diagnosis not present

## 2023-10-14 DIAGNOSIS — C4441 Basal cell carcinoma of skin of scalp and neck: Secondary | ICD-10-CM | POA: Diagnosis not present

## 2023-10-14 DIAGNOSIS — L821 Other seborrheic keratosis: Secondary | ICD-10-CM | POA: Diagnosis not present

## 2023-10-14 DIAGNOSIS — Z129 Encounter for screening for malignant neoplasm, site unspecified: Secondary | ICD-10-CM | POA: Diagnosis not present

## 2023-10-14 DIAGNOSIS — D0422 Carcinoma in situ of skin of left ear and external auricular canal: Secondary | ICD-10-CM | POA: Diagnosis not present

## 2023-10-14 DIAGNOSIS — D485 Neoplasm of uncertain behavior of skin: Secondary | ICD-10-CM | POA: Diagnosis not present

## 2023-10-16 DIAGNOSIS — E1140 Type 2 diabetes mellitus with diabetic neuropathy, unspecified: Secondary | ICD-10-CM | POA: Diagnosis not present

## 2023-10-16 DIAGNOSIS — D631 Anemia in chronic kidney disease: Secondary | ICD-10-CM | POA: Diagnosis not present

## 2023-10-16 DIAGNOSIS — N400 Enlarged prostate without lower urinary tract symptoms: Secondary | ICD-10-CM | POA: Diagnosis not present

## 2023-10-16 DIAGNOSIS — I4891 Unspecified atrial fibrillation: Secondary | ICD-10-CM | POA: Diagnosis not present

## 2023-10-16 DIAGNOSIS — Z794 Long term (current) use of insulin: Secondary | ICD-10-CM | POA: Diagnosis not present

## 2023-10-16 DIAGNOSIS — N1831 Chronic kidney disease, stage 3a: Secondary | ICD-10-CM | POA: Diagnosis not present

## 2023-10-16 DIAGNOSIS — E1122 Type 2 diabetes mellitus with diabetic chronic kidney disease: Secondary | ICD-10-CM | POA: Diagnosis not present

## 2023-10-16 DIAGNOSIS — F331 Major depressive disorder, recurrent, moderate: Secondary | ICD-10-CM | POA: Diagnosis not present

## 2023-10-16 DIAGNOSIS — I700 Atherosclerosis of aorta: Secondary | ICD-10-CM | POA: Diagnosis not present

## 2023-10-20 DIAGNOSIS — E1122 Type 2 diabetes mellitus with diabetic chronic kidney disease: Secondary | ICD-10-CM | POA: Diagnosis not present

## 2023-10-20 DIAGNOSIS — E1140 Type 2 diabetes mellitus with diabetic neuropathy, unspecified: Secondary | ICD-10-CM | POA: Diagnosis not present

## 2023-10-20 DIAGNOSIS — N1831 Chronic kidney disease, stage 3a: Secondary | ICD-10-CM | POA: Diagnosis not present

## 2023-10-20 DIAGNOSIS — Z794 Long term (current) use of insulin: Secondary | ICD-10-CM | POA: Diagnosis not present

## 2023-10-20 DIAGNOSIS — I700 Atherosclerosis of aorta: Secondary | ICD-10-CM | POA: Diagnosis not present

## 2023-10-20 DIAGNOSIS — I4891 Unspecified atrial fibrillation: Secondary | ICD-10-CM | POA: Diagnosis not present

## 2023-10-20 DIAGNOSIS — F331 Major depressive disorder, recurrent, moderate: Secondary | ICD-10-CM | POA: Diagnosis not present

## 2023-10-20 DIAGNOSIS — N400 Enlarged prostate without lower urinary tract symptoms: Secondary | ICD-10-CM | POA: Diagnosis not present

## 2023-10-20 DIAGNOSIS — D631 Anemia in chronic kidney disease: Secondary | ICD-10-CM | POA: Diagnosis not present

## 2023-10-21 DIAGNOSIS — D631 Anemia in chronic kidney disease: Secondary | ICD-10-CM | POA: Diagnosis not present

## 2023-10-21 DIAGNOSIS — E1140 Type 2 diabetes mellitus with diabetic neuropathy, unspecified: Secondary | ICD-10-CM | POA: Diagnosis not present

## 2023-10-21 DIAGNOSIS — I2510 Atherosclerotic heart disease of native coronary artery without angina pectoris: Secondary | ICD-10-CM | POA: Diagnosis not present

## 2023-10-21 DIAGNOSIS — N400 Enlarged prostate without lower urinary tract symptoms: Secondary | ICD-10-CM | POA: Diagnosis not present

## 2023-10-21 DIAGNOSIS — F331 Major depressive disorder, recurrent, moderate: Secondary | ICD-10-CM | POA: Diagnosis not present

## 2023-10-21 DIAGNOSIS — E1122 Type 2 diabetes mellitus with diabetic chronic kidney disease: Secondary | ICD-10-CM | POA: Diagnosis not present

## 2023-10-21 DIAGNOSIS — I4891 Unspecified atrial fibrillation: Secondary | ICD-10-CM | POA: Diagnosis not present

## 2023-10-21 DIAGNOSIS — I700 Atherosclerosis of aorta: Secondary | ICD-10-CM | POA: Diagnosis not present

## 2023-10-21 DIAGNOSIS — N1831 Chronic kidney disease, stage 3a: Secondary | ICD-10-CM | POA: Diagnosis not present

## 2023-10-21 DIAGNOSIS — R634 Abnormal weight loss: Secondary | ICD-10-CM | POA: Diagnosis not present

## 2023-10-21 DIAGNOSIS — Z794 Long term (current) use of insulin: Secondary | ICD-10-CM | POA: Diagnosis not present

## 2023-10-21 DIAGNOSIS — H409 Unspecified glaucoma: Secondary | ICD-10-CM | POA: Diagnosis not present

## 2023-10-27 DIAGNOSIS — N1831 Chronic kidney disease, stage 3a: Secondary | ICD-10-CM | POA: Diagnosis not present

## 2023-10-27 DIAGNOSIS — I700 Atherosclerosis of aorta: Secondary | ICD-10-CM | POA: Diagnosis not present

## 2023-10-27 DIAGNOSIS — D631 Anemia in chronic kidney disease: Secondary | ICD-10-CM | POA: Diagnosis not present

## 2023-10-27 DIAGNOSIS — Z794 Long term (current) use of insulin: Secondary | ICD-10-CM | POA: Diagnosis not present

## 2023-10-27 DIAGNOSIS — N400 Enlarged prostate without lower urinary tract symptoms: Secondary | ICD-10-CM | POA: Diagnosis not present

## 2023-10-27 DIAGNOSIS — F331 Major depressive disorder, recurrent, moderate: Secondary | ICD-10-CM | POA: Diagnosis not present

## 2023-10-27 DIAGNOSIS — E1140 Type 2 diabetes mellitus with diabetic neuropathy, unspecified: Secondary | ICD-10-CM | POA: Diagnosis not present

## 2023-10-27 DIAGNOSIS — E1122 Type 2 diabetes mellitus with diabetic chronic kidney disease: Secondary | ICD-10-CM | POA: Diagnosis not present

## 2023-10-27 DIAGNOSIS — I4891 Unspecified atrial fibrillation: Secondary | ICD-10-CM | POA: Diagnosis not present

## 2023-10-29 DIAGNOSIS — Z7984 Long term (current) use of oral hypoglycemic drugs: Secondary | ICD-10-CM | POA: Diagnosis not present

## 2023-10-29 DIAGNOSIS — Z961 Presence of intraocular lens: Secondary | ICD-10-CM | POA: Diagnosis not present

## 2023-10-29 DIAGNOSIS — H401132 Primary open-angle glaucoma, bilateral, moderate stage: Secondary | ICD-10-CM | POA: Diagnosis not present

## 2023-10-29 DIAGNOSIS — H26492 Other secondary cataract, left eye: Secondary | ICD-10-CM | POA: Diagnosis not present

## 2023-11-04 DIAGNOSIS — N400 Enlarged prostate without lower urinary tract symptoms: Secondary | ICD-10-CM | POA: Diagnosis not present

## 2023-11-04 DIAGNOSIS — E1122 Type 2 diabetes mellitus with diabetic chronic kidney disease: Secondary | ICD-10-CM | POA: Diagnosis not present

## 2023-11-04 DIAGNOSIS — I700 Atherosclerosis of aorta: Secondary | ICD-10-CM | POA: Diagnosis not present

## 2023-11-04 DIAGNOSIS — N1831 Chronic kidney disease, stage 3a: Secondary | ICD-10-CM | POA: Diagnosis not present

## 2023-11-04 DIAGNOSIS — I4891 Unspecified atrial fibrillation: Secondary | ICD-10-CM | POA: Diagnosis not present

## 2023-11-04 DIAGNOSIS — D631 Anemia in chronic kidney disease: Secondary | ICD-10-CM | POA: Diagnosis not present

## 2023-11-04 DIAGNOSIS — F331 Major depressive disorder, recurrent, moderate: Secondary | ICD-10-CM | POA: Diagnosis not present

## 2023-11-04 DIAGNOSIS — Z794 Long term (current) use of insulin: Secondary | ICD-10-CM | POA: Diagnosis not present

## 2023-11-04 DIAGNOSIS — E1140 Type 2 diabetes mellitus with diabetic neuropathy, unspecified: Secondary | ICD-10-CM | POA: Diagnosis not present

## 2023-11-10 DIAGNOSIS — R338 Other retention of urine: Secondary | ICD-10-CM | POA: Diagnosis not present

## 2023-11-12 DIAGNOSIS — I5189 Other ill-defined heart diseases: Secondary | ICD-10-CM | POA: Diagnosis not present

## 2023-11-12 DIAGNOSIS — I2510 Atherosclerotic heart disease of native coronary artery without angina pectoris: Secondary | ICD-10-CM | POA: Diagnosis not present

## 2023-11-12 DIAGNOSIS — N1831 Chronic kidney disease, stage 3a: Secondary | ICD-10-CM | POA: Diagnosis not present

## 2023-11-12 DIAGNOSIS — Z794 Long term (current) use of insulin: Secondary | ICD-10-CM | POA: Diagnosis not present

## 2023-11-12 DIAGNOSIS — E1140 Type 2 diabetes mellitus with diabetic neuropathy, unspecified: Secondary | ICD-10-CM | POA: Diagnosis not present

## 2023-11-12 DIAGNOSIS — I4891 Unspecified atrial fibrillation: Secondary | ICD-10-CM | POA: Diagnosis not present

## 2023-11-13 DIAGNOSIS — D631 Anemia in chronic kidney disease: Secondary | ICD-10-CM | POA: Diagnosis not present

## 2023-11-13 DIAGNOSIS — I700 Atherosclerosis of aorta: Secondary | ICD-10-CM | POA: Diagnosis not present

## 2023-11-13 DIAGNOSIS — E1122 Type 2 diabetes mellitus with diabetic chronic kidney disease: Secondary | ICD-10-CM | POA: Diagnosis not present

## 2023-11-13 DIAGNOSIS — E1140 Type 2 diabetes mellitus with diabetic neuropathy, unspecified: Secondary | ICD-10-CM | POA: Diagnosis not present

## 2023-11-13 DIAGNOSIS — F331 Major depressive disorder, recurrent, moderate: Secondary | ICD-10-CM | POA: Diagnosis not present

## 2023-11-13 DIAGNOSIS — Z794 Long term (current) use of insulin: Secondary | ICD-10-CM | POA: Diagnosis not present

## 2023-11-13 DIAGNOSIS — N1831 Chronic kidney disease, stage 3a: Secondary | ICD-10-CM | POA: Diagnosis not present

## 2023-11-13 DIAGNOSIS — N400 Enlarged prostate without lower urinary tract symptoms: Secondary | ICD-10-CM | POA: Diagnosis not present

## 2023-11-13 DIAGNOSIS — I4891 Unspecified atrial fibrillation: Secondary | ICD-10-CM | POA: Diagnosis not present

## 2023-11-16 DIAGNOSIS — Z794 Long term (current) use of insulin: Secondary | ICD-10-CM | POA: Diagnosis not present

## 2023-11-16 DIAGNOSIS — E1122 Type 2 diabetes mellitus with diabetic chronic kidney disease: Secondary | ICD-10-CM | POA: Diagnosis not present

## 2023-11-16 DIAGNOSIS — N1831 Chronic kidney disease, stage 3a: Secondary | ICD-10-CM | POA: Diagnosis not present

## 2023-11-16 DIAGNOSIS — F331 Major depressive disorder, recurrent, moderate: Secondary | ICD-10-CM | POA: Diagnosis not present

## 2023-11-16 DIAGNOSIS — I700 Atherosclerosis of aorta: Secondary | ICD-10-CM | POA: Diagnosis not present

## 2023-11-16 DIAGNOSIS — E1140 Type 2 diabetes mellitus with diabetic neuropathy, unspecified: Secondary | ICD-10-CM | POA: Diagnosis not present

## 2023-11-16 DIAGNOSIS — I4891 Unspecified atrial fibrillation: Secondary | ICD-10-CM | POA: Diagnosis not present

## 2023-11-16 DIAGNOSIS — D631 Anemia in chronic kidney disease: Secondary | ICD-10-CM | POA: Diagnosis not present

## 2023-11-16 DIAGNOSIS — N400 Enlarged prostate without lower urinary tract symptoms: Secondary | ICD-10-CM | POA: Diagnosis not present

## 2023-11-26 ENCOUNTER — Telehealth: Payer: Self-pay | Admitting: Cardiovascular Disease

## 2023-11-26 NOTE — Telephone Encounter (Signed)
 Spoke with pt who stated [employer] had called him to tell him he needs to be on a blood thinner but did not give a reason for why he needs to be on a blood thinner. Explained to pt that I will forward this to Dr. Alroy Aspen to review for when he is back in the office as well as Katlyn West who he is scheduled to see in July. Pt verbalized understanding and asked for a call back when the providers give their recommendations.

## 2023-11-26 NOTE — Telephone Encounter (Signed)
 Pt states that he was told by Elihu Grumet that he needs to be on a blood thinner, requesting cb

## 2023-11-28 NOTE — Telephone Encounter (Signed)
 I do not see indication for blood thinner Can he get something written from [facility] to clarify?

## 2023-12-08 DIAGNOSIS — R338 Other retention of urine: Secondary | ICD-10-CM | POA: Diagnosis not present

## 2023-12-23 DIAGNOSIS — D0422 Carcinoma in situ of skin of left ear and external auricular canal: Secondary | ICD-10-CM | POA: Diagnosis not present

## 2023-12-28 DIAGNOSIS — E119 Type 2 diabetes mellitus without complications: Secondary | ICD-10-CM | POA: Diagnosis not present

## 2023-12-30 DIAGNOSIS — L905 Scar conditions and fibrosis of skin: Secondary | ICD-10-CM | POA: Diagnosis not present

## 2024-01-05 DIAGNOSIS — R338 Other retention of urine: Secondary | ICD-10-CM | POA: Diagnosis not present

## 2024-01-06 ENCOUNTER — Ambulatory Visit: Admitting: Cardiology

## 2024-02-09 DIAGNOSIS — R338 Other retention of urine: Secondary | ICD-10-CM | POA: Diagnosis not present

## 2024-03-09 DIAGNOSIS — R338 Other retention of urine: Secondary | ICD-10-CM | POA: Diagnosis not present

## 2024-03-27 DIAGNOSIS — E119 Type 2 diabetes mellitus without complications: Secondary | ICD-10-CM | POA: Diagnosis not present

## 2024-04-05 DIAGNOSIS — R338 Other retention of urine: Secondary | ICD-10-CM | POA: Diagnosis not present

## 2024-04-13 DIAGNOSIS — Z125 Encounter for screening for malignant neoplasm of prostate: Secondary | ICD-10-CM | POA: Diagnosis not present

## 2024-04-13 DIAGNOSIS — E785 Hyperlipidemia, unspecified: Secondary | ICD-10-CM | POA: Diagnosis not present

## 2024-04-13 DIAGNOSIS — Z794 Long term (current) use of insulin: Secondary | ICD-10-CM | POA: Diagnosis not present

## 2024-04-13 DIAGNOSIS — Z0189 Encounter for other specified special examinations: Secondary | ICD-10-CM | POA: Diagnosis not present

## 2024-04-13 DIAGNOSIS — D649 Anemia, unspecified: Secondary | ICD-10-CM | POA: Diagnosis not present

## 2024-04-13 DIAGNOSIS — Z7189 Other specified counseling: Secondary | ICD-10-CM | POA: Diagnosis not present

## 2024-04-13 DIAGNOSIS — E039 Hypothyroidism, unspecified: Secondary | ICD-10-CM | POA: Diagnosis not present

## 2024-04-13 DIAGNOSIS — I2510 Atherosclerotic heart disease of native coronary artery without angina pectoris: Secondary | ICD-10-CM | POA: Diagnosis not present

## 2024-04-13 DIAGNOSIS — E1140 Type 2 diabetes mellitus with diabetic neuropathy, unspecified: Secondary | ICD-10-CM | POA: Diagnosis not present

## 2024-04-13 DIAGNOSIS — N1832 Chronic kidney disease, stage 3b: Secondary | ICD-10-CM | POA: Diagnosis not present

## 2024-04-19 DIAGNOSIS — N1832 Chronic kidney disease, stage 3b: Secondary | ICD-10-CM | POA: Diagnosis not present

## 2024-04-19 DIAGNOSIS — I4891 Unspecified atrial fibrillation: Secondary | ICD-10-CM | POA: Diagnosis not present

## 2024-04-19 DIAGNOSIS — E785 Hyperlipidemia, unspecified: Secondary | ICD-10-CM | POA: Diagnosis not present

## 2024-04-19 DIAGNOSIS — N401 Enlarged prostate with lower urinary tract symptoms: Secondary | ICD-10-CM | POA: Diagnosis not present

## 2024-04-19 DIAGNOSIS — Z0000 Encounter for general adult medical examination without abnormal findings: Secondary | ICD-10-CM | POA: Diagnosis not present

## 2024-04-19 DIAGNOSIS — I700 Atherosclerosis of aorta: Secondary | ICD-10-CM | POA: Diagnosis not present

## 2024-04-19 DIAGNOSIS — I2510 Atherosclerotic heart disease of native coronary artery without angina pectoris: Secondary | ICD-10-CM | POA: Diagnosis not present

## 2024-04-19 DIAGNOSIS — E1140 Type 2 diabetes mellitus with diabetic neuropathy, unspecified: Secondary | ICD-10-CM | POA: Diagnosis not present

## 2024-04-19 DIAGNOSIS — Z85048 Personal history of other malignant neoplasm of rectum, rectosigmoid junction, and anus: Secondary | ICD-10-CM | POA: Diagnosis not present

## 2024-04-19 DIAGNOSIS — E039 Hypothyroidism, unspecified: Secondary | ICD-10-CM | POA: Diagnosis not present

## 2024-04-19 DIAGNOSIS — Z794 Long term (current) use of insulin: Secondary | ICD-10-CM | POA: Diagnosis not present

## 2024-04-20 DIAGNOSIS — L570 Actinic keratosis: Secondary | ICD-10-CM | POA: Diagnosis not present

## 2024-04-20 DIAGNOSIS — C4442 Squamous cell carcinoma of skin of scalp and neck: Secondary | ICD-10-CM | POA: Diagnosis not present

## 2024-04-20 DIAGNOSIS — L821 Other seborrheic keratosis: Secondary | ICD-10-CM | POA: Diagnosis not present

## 2024-04-20 DIAGNOSIS — C4441 Basal cell carcinoma of skin of scalp and neck: Secondary | ICD-10-CM | POA: Diagnosis not present

## 2024-04-20 DIAGNOSIS — Z86008 Personal history of in-situ neoplasm of other site: Secondary | ICD-10-CM | POA: Diagnosis not present

## 2024-04-20 DIAGNOSIS — D485 Neoplasm of uncertain behavior of skin: Secondary | ICD-10-CM | POA: Diagnosis not present

## 2024-05-03 DIAGNOSIS — H401132 Primary open-angle glaucoma, bilateral, moderate stage: Secondary | ICD-10-CM | POA: Diagnosis not present

## 2024-05-09 DIAGNOSIS — C4441 Basal cell carcinoma of skin of scalp and neck: Secondary | ICD-10-CM | POA: Diagnosis not present

## 2024-05-09 DIAGNOSIS — C4442 Squamous cell carcinoma of skin of scalp and neck: Secondary | ICD-10-CM | POA: Diagnosis not present

## 2024-05-10 DIAGNOSIS — R338 Other retention of urine: Secondary | ICD-10-CM | POA: Diagnosis not present

## 2024-06-07 DIAGNOSIS — R338 Other retention of urine: Secondary | ICD-10-CM | POA: Diagnosis not present
